# Patient Record
Sex: Female | Born: 1955 | Race: Black or African American | Hispanic: No | Marital: Single | Smoking: Never smoker
Health system: Southern US, Community
[De-identification: ages and names within clinical notes are randomized; demographics above are authoritative.]

## PROBLEM LIST (undated history)

## (undated) ENCOUNTER — Inpatient Hospital Stay (HOSPITAL_COMMUNITY): Payer: 59

## (undated) DIAGNOSIS — R7309 Other abnormal glucose: Secondary | ICD-10-CM

## (undated) DIAGNOSIS — D649 Anemia, unspecified: Secondary | ICD-10-CM

## (undated) DIAGNOSIS — E669 Obesity, unspecified: Secondary | ICD-10-CM

## (undated) DIAGNOSIS — I10 Essential (primary) hypertension: Secondary | ICD-10-CM

## (undated) DIAGNOSIS — IMO0002 Reserved for concepts with insufficient information to code with codable children: Secondary | ICD-10-CM

## (undated) DIAGNOSIS — M1990 Unspecified osteoarthritis, unspecified site: Secondary | ICD-10-CM

## (undated) DIAGNOSIS — K219 Gastro-esophageal reflux disease without esophagitis: Secondary | ICD-10-CM

## (undated) DIAGNOSIS — E785 Hyperlipidemia, unspecified: Secondary | ICD-10-CM

## (undated) DIAGNOSIS — D573 Sickle-cell trait: Secondary | ICD-10-CM

## (undated) HISTORY — DX: Gastro-esophageal reflux disease without esophagitis: K21.9

## (undated) HISTORY — DX: Essential (primary) hypertension: I10

## (undated) HISTORY — DX: Hyperlipidemia, unspecified: E78.5

## (undated) HISTORY — DX: Other abnormal glucose: R73.09

## (undated) HISTORY — DX: Anemia, unspecified: D64.9

## (undated) HISTORY — DX: Unspecified osteoarthritis, unspecified site: M19.90

## (undated) HISTORY — DX: Reserved for concepts with insufficient information to code with codable children: IMO0002

## (undated) HISTORY — DX: Sickle-cell trait: D57.3

## (undated) HISTORY — DX: Obesity, unspecified: E66.9

---

## 1998-02-07 ENCOUNTER — Ambulatory Visit (HOSPITAL_COMMUNITY): Admission: RE | Admit: 1998-02-07 | Discharge: 1998-02-07 | Payer: Self-pay | Admitting: Obstetrics and Gynecology

## 1999-02-17 ENCOUNTER — Ambulatory Visit (HOSPITAL_COMMUNITY): Admission: RE | Admit: 1999-02-17 | Discharge: 1999-02-17 | Payer: Self-pay | Admitting: Obstetrics and Gynecology

## 1999-02-17 ENCOUNTER — Encounter: Payer: Self-pay | Admitting: Obstetrics and Gynecology

## 1999-05-27 ENCOUNTER — Encounter: Admission: RE | Admit: 1999-05-27 | Discharge: 1999-05-27 | Payer: Self-pay | Admitting: Orthopedic Surgery

## 1999-05-27 ENCOUNTER — Encounter: Payer: Self-pay | Admitting: Orthopedic Surgery

## 1999-10-06 ENCOUNTER — Encounter: Payer: Self-pay | Admitting: Neurological Surgery

## 1999-10-06 ENCOUNTER — Ambulatory Visit (HOSPITAL_COMMUNITY): Admission: RE | Admit: 1999-10-06 | Discharge: 1999-10-06 | Payer: Self-pay | Admitting: Neurological Surgery

## 1999-10-08 ENCOUNTER — Encounter: Admission: RE | Admit: 1999-10-08 | Discharge: 1999-10-28 | Payer: Self-pay | Admitting: Neurological Surgery

## 2000-02-26 ENCOUNTER — Ambulatory Visit (HOSPITAL_COMMUNITY): Admission: RE | Admit: 2000-02-26 | Discharge: 2000-02-26 | Payer: Self-pay | Admitting: Internal Medicine

## 2000-02-26 ENCOUNTER — Encounter: Payer: Self-pay | Admitting: Internal Medicine

## 2000-05-03 ENCOUNTER — Other Ambulatory Visit: Admission: RE | Admit: 2000-05-03 | Discharge: 2000-05-03 | Payer: Self-pay | Admitting: Obstetrics and Gynecology

## 2001-03-07 ENCOUNTER — Encounter: Payer: Self-pay | Admitting: Obstetrics and Gynecology

## 2001-03-07 ENCOUNTER — Ambulatory Visit (HOSPITAL_COMMUNITY): Admission: RE | Admit: 2001-03-07 | Discharge: 2001-03-07 | Payer: Self-pay | Admitting: Obstetrics and Gynecology

## 2002-03-19 ENCOUNTER — Ambulatory Visit (HOSPITAL_COMMUNITY): Admission: RE | Admit: 2002-03-19 | Discharge: 2002-03-19 | Payer: Self-pay | Admitting: Obstetrics and Gynecology

## 2002-03-19 ENCOUNTER — Encounter: Payer: Self-pay | Admitting: Obstetrics and Gynecology

## 2003-03-22 ENCOUNTER — Ambulatory Visit (HOSPITAL_COMMUNITY): Admission: RE | Admit: 2003-03-22 | Discharge: 2003-03-22 | Payer: Self-pay | Admitting: Obstetrics and Gynecology

## 2004-03-26 ENCOUNTER — Ambulatory Visit (HOSPITAL_COMMUNITY): Admission: RE | Admit: 2004-03-26 | Discharge: 2004-03-26 | Payer: Self-pay | Admitting: Obstetrics and Gynecology

## 2004-11-18 ENCOUNTER — Encounter: Admission: RE | Admit: 2004-11-18 | Discharge: 2004-11-18 | Payer: Self-pay | Admitting: Internal Medicine

## 2005-04-28 ENCOUNTER — Encounter: Admission: RE | Admit: 2005-04-28 | Discharge: 2005-04-28 | Payer: Self-pay | Admitting: Internal Medicine

## 2005-05-24 HISTORY — PX: SHOULDER SURGERY: SHX246

## 2005-06-23 ENCOUNTER — Ambulatory Visit: Admission: RE | Admit: 2005-06-23 | Discharge: 2005-06-23 | Payer: Self-pay | Admitting: Internal Medicine

## 2005-12-21 ENCOUNTER — Ambulatory Visit (HOSPITAL_BASED_OUTPATIENT_CLINIC_OR_DEPARTMENT_OTHER): Admission: RE | Admit: 2005-12-21 | Discharge: 2005-12-21 | Payer: Self-pay | Admitting: Orthopedic Surgery

## 2005-12-29 ENCOUNTER — Encounter: Admission: RE | Admit: 2005-12-29 | Discharge: 2006-03-02 | Payer: Self-pay | Admitting: Orthopedic Surgery

## 2006-05-02 ENCOUNTER — Ambulatory Visit (HOSPITAL_COMMUNITY): Admission: RE | Admit: 2006-05-02 | Discharge: 2006-05-02 | Payer: Self-pay | Admitting: Internal Medicine

## 2007-05-10 ENCOUNTER — Ambulatory Visit (HOSPITAL_COMMUNITY): Admission: RE | Admit: 2007-05-10 | Discharge: 2007-05-10 | Payer: Self-pay | Admitting: Obstetrics and Gynecology

## 2008-05-13 ENCOUNTER — Ambulatory Visit (HOSPITAL_COMMUNITY): Admission: RE | Admit: 2008-05-13 | Discharge: 2008-05-13 | Payer: Self-pay | Admitting: Internal Medicine

## 2009-05-14 ENCOUNTER — Ambulatory Visit (HOSPITAL_COMMUNITY): Admission: RE | Admit: 2009-05-14 | Discharge: 2009-05-14 | Payer: Self-pay | Admitting: Obstetrics and Gynecology

## 2009-06-24 LAB — HM COLONOSCOPY

## 2010-05-19 ENCOUNTER — Ambulatory Visit (HOSPITAL_COMMUNITY)
Admission: RE | Admit: 2010-05-19 | Discharge: 2010-05-19 | Payer: Self-pay | Source: Home / Self Care | Attending: Internal Medicine | Admitting: Internal Medicine

## 2010-10-09 NOTE — Op Note (Signed)
Alicia, Diaz               ACCOUNT NO.:  [id number]   MEDICAL RECORD NO.:  [id number]          PATIENT TYPE:  AMB   LOCATION:  DSC                          FACILITY:  [facility]   PHYSICIAN:  Deidre Ala, M.D.    DATE OF BIRTH:  1955-10-08   DATE OF PROCEDURE:  12/21/2005  DATE OF DISCHARGE:                                 OPERATIVE REPORT   PREOPERATIVE DIAGNOSES:  1.  Left shoulder impingement syndrome.  2.  Acromioclavicular joint arthritis.  3.  Rotator cuff tear with severe tendinosi's.   POSTOPERATIVE DIAGNOSES:  1.  Impingement syndrome, right shoulder.  2.  Severe acromioclavicular joint arthritis.  3.  Irreparable rotator cuff tear.  4.  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  5.  Glenoid labral tear.   PROCEDURE:  1.  Left shoulder operative arthroscopy with subacromial arch decompression,      acromioplasty.  2.  Extensive debridement of glenohumeral joint degenerative joint disease,      labral tear and rotator cuff tear.  3.  Arthroscopic distal clavicular resection.   SURGEON:  1.  Charlesetta Shanks, M.D.   ASSISTANTJeronimo Norma, PA-C   ANESTHESIA:  General endotracheal.   CULTURES:  None.   DRAINS:  None.   ESTIMATED BLOOD LOSS:  Minimal.   PATHOLOGICAL FINDINGS/HISTORY:  Alicia Diaz presented in June with a history of  left shoulder pain.  She had this 2 years prior.  Dr. Oneta Rack had ordered an  MRI scan and there were some ligament tears, there was a reaggravation of  her injury.  We saw her.  She had a type 3 acromion with AC joint spring  changes.  We injected her subacromial space and sent her for an MRI scan, a  new one.  She had advanced cystic erosive changes in the cartilage of the  glenoid rim with AC joint arthrosis with narrowing of the supraspinatus  outlet.  There was a small full-thickness tear of the anterior supraspinatus  tendon insertion, superimposed on a partial thickness articular surface tear  of the adjacent  tendon fibers.  At surgery she had a significant glenoid  labral tear inferiorly with instability.  She had meaning of the tear.  It  was not exactly a Bankart lesion but it was just a stellate inferior  glenolabral tear, degenerative anterior tearing with an intact biceps tendon  with no slap lesion.  There was some undersurface rotator cuff tearing and  there was glenoid DJD in the anterior inferior 1/2 that debrided to bone and  some cystic changes in the posterior glenoid.  There are grade 1 to 2  changes on the humeral head.  The anterior acromion was sharp, craggy and  obviously causing impingement with a marked AC joint arthritis and  degeneration of the AC meniscus. The rotator cuff tear was pulled up off the  tuberosity but when we tried to pull it back down, there were multiple  striated tears through the cuff.  It was delaminated and poor tissue quality  and was deemed unrepairable.  It was derided with  a tuberosity plasty  carried out.   DESCRIPTION OF PROCEDURE:  With adequate anesthesia obtained using  endotracheal technique, 1 g Ancef given IV prophylaxis  and another during  the procedure.  The patient was placed in the supine beach chair position.  The entire procedure was difficult due to a high body mass index.  After  standard prepping and draping of the left shoulder, we made skin markings  for anatomical landmarks which were somewhat difficult.  I then injected the  subacromial space with 0.5% Marcaine with epinephrine to open it up.  I then  entered the shoulder through a posterior portal; anterior portal was  established just lateral to the coracoid.  I then debrided synovitis,  glenolabral tearing anteriorly and inferior with basket and shaver and  undersurface rotator cuff tearing.  ablator was used to smooth. I then  smoothed the glenoid surface of its anterior inferior and posterior rim DJD  with some cystic changes posteriorly.  Portals reversed and similar  shavings  carried out.  I then entered the subacromial space through the posterior  portal; anterolateral portal was established.  I debrided soft tissue from  the anterior undersurface of the acromion and used the ablator on 1 to  smooth.  I then completed acromioplasty to the roof of the subacromial space  in the manner of Caspari with a 6.0 bur.  I then turned the scope medially  sideways and through the anterior portal debrided the [facility] meniscus and  completed distal clavicular resection 2 shaver breadths in.  I then entered  the shoulder form the lateral portal and debrided the anterior undersurface  of the acromion back to the bicortical mode in the manner of Caspari.  Additional distal clavicular resection was carried out and smoothed.  I then  made an additional posterolateral portal and used a grabber to assess the  viability of the rotator cuff. We deemed it irreparable and used basket and  shaver to debride the rotator cuff back to a stable rim anteriorly and  posteriorly with ablator used to smooth.  I then completed the tuberosity  plasty to smooth the tuberosity and used the ablator to smooth.  When I was  satisfied with the resection, the shoulder was irrigated through the scope,  0.5% Marcaine was injected about the portals. The portals were closed except  the anterior one to drain.  We used a bulky sterile compressive dressing  with a sling.  The patient having tolerated the procedure well was awakened,  taken to the recovery room in satisfactory condition to be discharged per  patient routine, given Percocet for pain and told to call the office for  recheck tomorrow.           ______________________________  V. Charlesetta Shanks, M.D.     VEP/MEDQ  D:  12/21/2005  T:  12/22/2005  Job:  [id number]   cc:   Lucky Cowboy, M.D.

## 2011-04-19 ENCOUNTER — Other Ambulatory Visit (HOSPITAL_COMMUNITY): Payer: Self-pay | Admitting: Obstetrics and Gynecology

## 2011-04-19 DIAGNOSIS — Z1231 Encounter for screening mammogram for malignant neoplasm of breast: Secondary | ICD-10-CM

## 2011-05-21 ENCOUNTER — Ambulatory Visit (HOSPITAL_COMMUNITY)
Admission: RE | Admit: 2011-05-21 | Discharge: 2011-05-21 | Disposition: A | Discharge status: Home / Self Care | Payer: 59 | Source: Ambulatory Visit | Attending: Obstetrics and Gynecology | Admitting: Obstetrics and Gynecology

## 2011-05-21 DIAGNOSIS — Z1231 Encounter for screening mammogram for malignant neoplasm of breast: Secondary | ICD-10-CM

## 2012-04-27 ENCOUNTER — Other Ambulatory Visit (HOSPITAL_COMMUNITY): Payer: Self-pay | Admitting: Obstetrics and Gynecology

## 2012-04-27 DIAGNOSIS — Z1231 Encounter for screening mammogram for malignant neoplasm of breast: Secondary | ICD-10-CM

## 2012-05-22 ENCOUNTER — Ambulatory Visit (HOSPITAL_COMMUNITY)
Admission: RE | Admit: 2012-05-22 | Discharge: 2012-05-22 | Disposition: A | Discharge status: Home / Self Care | Payer: 59 | Source: Ambulatory Visit | Attending: Obstetrics and Gynecology | Admitting: Obstetrics and Gynecology

## 2012-05-22 DIAGNOSIS — Z1231 Encounter for screening mammogram for malignant neoplasm of breast: Secondary | ICD-10-CM | POA: Insufficient documentation

## 2013-04-12 ENCOUNTER — Ambulatory Visit: Payer: 59 | Admitting: Emergency Medicine

## 2013-04-12 ENCOUNTER — Encounter: Payer: Self-pay | Admitting: Emergency Medicine

## 2013-04-12 VITALS — BP 132/84 | HR 86 | Temp 97.8°F | Resp 20 | Ht 67.5 in | Wt 348.0 lb

## 2013-04-12 DIAGNOSIS — R7309 Other abnormal glucose: Secondary | ICD-10-CM | POA: Insufficient documentation

## 2013-04-12 DIAGNOSIS — D573 Sickle-cell trait: Secondary | ICD-10-CM | POA: Insufficient documentation

## 2013-04-12 DIAGNOSIS — M1990 Unspecified osteoarthritis, unspecified site: Secondary | ICD-10-CM | POA: Insufficient documentation

## 2013-04-12 DIAGNOSIS — E782 Mixed hyperlipidemia: Secondary | ICD-10-CM | POA: Insufficient documentation

## 2013-04-12 DIAGNOSIS — I10 Essential (primary) hypertension: Secondary | ICD-10-CM

## 2013-04-12 DIAGNOSIS — M25572 Pain in left ankle and joints of left foot: Secondary | ICD-10-CM

## 2013-04-12 LAB — BASIC METABOLIC PANEL WITH GFR
BUN: 15 mg/dL (ref 6–23)
CO2: 27 mEq/L (ref 19–32)
Calcium: 9.6 mg/dL (ref 8.4–10.5)
Chloride: 99 mEq/L (ref 96–112)
Creat: 0.76 mg/dL (ref 0.50–1.10)
GFR, Est African American: >89 mL/min
GFR, Est Non African American: 87 mL/min
Glucose, Bld: 93 mg/dL (ref 70–99)
Potassium: 4.1 mEq/L (ref 3.5–5.3)
Sodium: 137 mEq/L (ref 135–145)

## 2013-04-12 LAB — CBC WITH DIFFERENTIAL/PLATELET
Eosinophils Relative: 2 % (ref 0–5)
HCT: 37.9 % (ref 36.0–46.0)
Hemoglobin: 12.8 g/dL (ref 12.0–15.0)
Lymphocytes Relative: 35 % (ref 12–46)
Lymphs Abs: 2.7 10*3/uL (ref 0.7–4.0)
MCV: 81.9 fL (ref 78.0–100.0)
Monocytes Absolute: 0.5 10*3/uL (ref 0.1–1.0)
Monocytes Relative: 6 % (ref 3–12)
Neutro Abs: 4.4 10*3/uL (ref 1.7–7.7)
RBC: 4.63 MIL/uL (ref 3.87–5.11)
RDW: 13.9 % (ref 11.5–15.5)
WBC: 7.7 10*3/uL (ref 4.0–10.5)

## 2013-04-12 MED ORDER — ALLOPURINOL 300 MG PO TABS: 300.0000 mg | ORAL_TABLET | Freq: Every day | ORAL | Status: DC

## 2013-04-12 MED ORDER — HYDROCODONE-ACETAMINOPHEN 5-325 MG PO TABS: ORAL_TABLET | ORAL | Status: DC

## 2013-04-12 MED ORDER — METHYLPREDNISOLONE (PAK) 4 MG PO TABS: 4.0000 mg | ORAL_TABLET | Freq: Every day | ORAL | Status: DC

## 2013-04-12 NOTE — Patient Instructions (Signed)
Gout  Gout is when your joints become red, sore, and swell (inflammed). This is caused by the buildup of uric acid crystals in the joints. Uric acid is a chemical that is normally in the blood. If the level of uric acid gets too high in the blood, these crystals form in your joints and tissues. Over time, these crystals can form into masses near the joints and tissues. These masses can destroy bone and cause the bone to look misshapen (deformed).  HOME CARE   · Do not take aspirin for pain.  · Only take medicine as told by your doctor.  · Rest the joint as much as you can. When in bed, keep sheets and blankets off painful areas.  · Keep the sore joints raised (elevated).  · Put warm or cold packs on painful joints. Use of warm or cold packs depends on which works best for you.  · Use crutches if the painful joint is in your leg.  · Drink enough fluids to keep your pee (urine) clear or pale yellow. Limit alcohol, sugary drinks, and drinks with fructose in them.  · Follow your diet instructions. Pay careful attention to how much protein you eat. Include fruits, vegetables, whole grains, and fat-free or low-fat milk products in your daily diet. Talk to your doctor or dietician about the use of coffee, vitamin C, and cherries. These may help lower uric acid levels.  · Keep a healthy body weight.  GET HELP RIGHT AWAY IF:   · You have watery poop (diarrhea), throw up (vomit), or have any side effects from medicines.  · You do not feel better in 24 hours, or you are getting worse.  · Your joint becomes suddenly more tender, and you have chills or a fever.  MAKE SURE YOU:   · Understand these instructions.  · Will watch your condition.  · Will get help right away if you are not doing well or get worse.  Document Released: 02/17/2008 Document Revised: 09/04/2012 Document Reviewed: 08/18/2009  [employer]® Patient Information ©2014 [employer].

## 2013-04-13 ENCOUNTER — Telehealth: Payer: Self-pay | Admitting: *Deleted

## 2013-04-13 NOTE — Telephone Encounter (Signed)
Message copied by Nicholaus Corolla A on Fri Apr 13, 2013  9:47 AM ------      Message from: [place], [place] R      Created: Fri Apr 13, 2013  5:46 AM       Gout positive. Take Rx AD. Will need recheck at 05/02/13 OV ------

## 2013-04-13 NOTE — Progress Notes (Signed)
  Subjective:    Patient ID: Alicia Diaz, female    DOB: 1956/03/20, 57 y.o.   MRN: [id number]  HPI Comments: 57 yo presents for increased foot pain, redness and edema without trauma x 5days. She notes mother has gout. No HX of clots. She has not taken anything except for old vicodin which help temporarily.   Medications/ allergies no change and reviewed from last OV on 03/07/13  Past Medical History  Diagnosis Date  . Obesity   . Anemia   . Hyperlipidemia   . Hypertension   . Sickle cell trait   . Elevated hemoglobin A1c   . DJD (degenerative joint disease)   . DDD (degenerative disc disease)     Review of Systems  Musculoskeletal: Positive for gait problem and joint swelling.  All other systems reviewed and are negative.    BP 132/84  Pulse 86  Temp(Src) 97.8 F (36.6 C) (Temporal)  Resp 20  Ht 5' 7.5" (1.715 m)  Wt 348 lb (157.852 kg)  BMI 53.67 kg/m2     Objective:   Physical Exam  Constitutional: She is oriented to person, place, and time. She appears well-developed and well-nourished.  Cardiovascular: Normal rate, regular rhythm, normal heart sounds and intact distal pulses.   Pulmonary/Chest: Effort normal and breath sounds normal.  Musculoskeletal: Normal range of motion. She exhibits edema and tenderness.  Left medial foot and great toe   Neurological: She is alert and oriented to person, place, and time.  Skin: Skin is warm and dry. There is erythema.  Left medial foot and great toe  Psychiatric: Judgment normal.          Assessment & Plan:  1. Probable gout check labs, rest, elevate, ice. Pred DP 10 mg AD, add Allopurinol 300 mg 03/25/13, Norco 5/325 AD. Hygiene/ education explained

## 2013-04-16 NOTE — Telephone Encounter (Signed)
Spoke with patient about recent lab results.  Pt states positive improvement with symptoms and decreased pain with Rx for gout.

## 2013-04-26 ENCOUNTER — Encounter: Payer: Self-pay | Admitting: General Practice

## 2013-04-26 ENCOUNTER — Encounter: Payer: Self-pay | Admitting: Internal Medicine

## 2013-04-30 ENCOUNTER — Other Ambulatory Visit (HOSPITAL_COMMUNITY): Payer: Self-pay | Admitting: Obstetrics and Gynecology

## 2013-04-30 DIAGNOSIS — Z1231 Encounter for screening mammogram for malignant neoplasm of breast: Secondary | ICD-10-CM

## 2013-05-02 ENCOUNTER — Ambulatory Visit (INDEPENDENT_AMBULATORY_CARE_PROVIDER_SITE_OTHER): Payer: 59 | Admitting: Internal Medicine

## 2013-05-02 ENCOUNTER — Encounter: Payer: Self-pay | Admitting: Internal Medicine

## 2013-05-02 VITALS — BP 156/100 | HR 88 | Temp 97.3°F | Resp 18 | Ht 67.25 in | Wt 354.0 lb

## 2013-05-02 DIAGNOSIS — R7401 Elevation of levels of liver transaminase levels: Secondary | ICD-10-CM

## 2013-05-02 DIAGNOSIS — Z111 Encounter for screening for respiratory tuberculosis: Secondary | ICD-10-CM

## 2013-05-02 DIAGNOSIS — Z113 Encounter for screening for infections with a predominantly sexual mode of transmission: Secondary | ICD-10-CM

## 2013-05-02 DIAGNOSIS — I10 Essential (primary) hypertension: Secondary | ICD-10-CM

## 2013-05-02 DIAGNOSIS — Z0000 Encounter for general adult medical examination without abnormal findings: Secondary | ICD-10-CM

## 2013-05-02 DIAGNOSIS — Z1212 Encounter for screening for malignant neoplasm of rectum: Secondary | ICD-10-CM

## 2013-05-02 DIAGNOSIS — R7309 Other abnormal glucose: Secondary | ICD-10-CM

## 2013-05-02 DIAGNOSIS — E559 Vitamin D deficiency, unspecified: Secondary | ICD-10-CM

## 2013-05-02 LAB — LIPID PANEL
Cholesterol: 201 mg/dL — ABNORMAL HIGH (ref 0–200)
HDL: 52 mg/dL (ref >39)
Total CHOL/HDL Ratio: 3.9 Ratio
Triglycerides: 138 mg/dL (ref <150)

## 2013-05-02 LAB — BASIC METABOLIC PANEL WITH GFR
CO2: 28 mEq/L (ref 19–32)
Chloride: 98 mEq/L (ref 96–112)
Glucose, Bld: 90 mg/dL (ref 70–99)
Sodium: 137 mEq/L (ref 135–145)

## 2013-05-02 LAB — CBC WITH DIFFERENTIAL/PLATELET
Eosinophils Absolute: 0.1 10*3/uL (ref 0.0–0.7)
Eosinophils Relative: 2 % (ref 0–5)
Hemoglobin: 12.6 g/dL (ref 12.0–15.0)
Lymphocytes Relative: 48 % — ABNORMAL HIGH (ref 12–46)
Lymphs Abs: 2.9 10*3/uL (ref 0.7–4.0)
MCH: 27.8 pg (ref 26.0–34.0)
MCV: 83.4 fL (ref 78.0–100.0)
Monocytes Relative: 6 % (ref 3–12)
Neutrophils Relative %: 43 % (ref 43–77)
Platelets: 214 10*3/uL (ref 150–400)
RBC: 4.53 MIL/uL (ref 3.87–5.11)
WBC: 6 10*3/uL (ref 4.0–10.5)

## 2013-05-02 LAB — HEPATIC FUNCTION PANEL
ALT: 25 U/L (ref 0–35)
AST: 20 U/L (ref 0–37)
Alkaline Phosphatase: 117 U/L (ref 39–117)
Bilirubin, Direct: 0.2 mg/dL (ref 0.0–0.3)
Total Bilirubin: 0.9 mg/dL (ref 0.3–1.2)

## 2013-05-02 LAB — TSH: TSH: 2.877 u[IU]/mL (ref 0.350–4.500)

## 2013-05-02 MED ORDER — PHENTERMINE HCL 37.5 MG PO TABS: 37.5000 mg | ORAL_TABLET | Freq: Every day | ORAL | Status: DC

## 2013-05-02 NOTE — Progress Notes (Signed)
Patient ID: Alicia Diaz, female   DOB: 22-May-1956, 57 y.o.   MRN: [id number]  Annual Screening Comprehensive Examination  This very nice 57 yo SBF  presents for complete physical.  Patient has been followed for HTN,  Prediabetes, Gout, Hyperlipidemia, Morbid Obesity and Vitamin D Deficiency.   Patient's BP was elevated today at 156/100 and on recheck at the Rt wrist it was 134/86. Patient denies any cardiac symptoms as chest pain, palpitations, shortness of breath, dizziness or ankle swelling.   Patient's hyperlipidemia is controlled with diet and medications. Patient denies myalgias or other medication SE's. Last cholesterol last visit was 182, triglycerides 123, HDL 53 and LDL 104.     Patient has prediabetes/insulin resistance with last A1c  5.9% / insulin 25 in October. Patient denies reactive hypoglycemic symptoms, visual blurring, diabetic polys, or paresthesias.    Finally, patient has history of Vitamin D Deficiency with last vitamin D of 25 in October with patient persistently declining to take Vitamin D blaming it  for causing muscle cramps which I have advised her I do not feel is a causative agent.   Regarding patient's Morbid Obesity with BMI of 55, her insurance company has repeatedly refused to cover her for gastric bipass surgery despite her going thru an intensive certification process at [employer] a couple of years ago.     Medication Sig Dispense Refill  . allopurinol (ZYLOPRIM) 300 MG tablet Take 1 tablet (300 mg total) by mouth daily.  90 tablet  2  . ALPRAZolam (XANAX) 0.5 MG tablet Take 0.5 mg by mouth 2 (two) times daily as needed for anxiety.      Marland Kitchen aspirin 81 MG tablet Take 81 mg by mouth daily.      . bumetanide (BUMEX) 1 MG tablet Take 1 mg by mouth as needed.      . doxazosin (CARDURA) 4 MG tablet Take 4 mg by mouth daily.      Marland Kitchen HYDROcodone-acetaminophen (NORCO/VICODIN) 5-325 MG per tablet 1- 2 tabs every 4-6 hours prn pain  60 tablet  0  .  losartan (COZAAR) 100 MG tablet Take 100 mg by mouth daily.      . naproxen sodium (ANAPROX) 220 MG tablet Take 220 mg by mouth daily as needed.      . potassium chloride (K-DUR,KLOR-CON) 10 MEQ tablet Take 20 mEq by mouth daily.         Allergies  Allergen Reactions  . Ace Inhibitors     Cough  . Paxil [Paroxetine Hcl]     Mood swings  . Prednisone     Nausea/ irratated  . Vitamin D Analogs     Cramping    Past Medical History  Diagnosis Date  . Obesity   . Anemia   . Hyperlipidemia   . Hypertension   . Sickle cell trait   . Elevated hemoglobin A1c   . DJD (degenerative joint disease)   . DDD (degenerative disc disease)   . GERD (gastroesophageal reflux disease)     No past surgical history on file.  Family History  Problem Relation Age of Onset  . Heart disease Mother   . Cancer Father     prostate  . Multiple myeloma Father   . Diabetes Sister   . Mental illness Sister   . Diabetes Brother   . Obstructive Sleep Apnea Brother     History  Substance Use Topics  . Smoking status: Never Smoker   . Smokeless tobacco: Not on  file  . Alcohol Use: 3.0 oz/week    6 drink(s) per week    ROS Constitutional: Denies fever, chills, weight loss/gain, headaches, insomnia, fatigue, night sweats, and change in appetite. Eyes: Denies redness, blurred vision, diplopia, discharge, itchy, watery eyes.  ENT: Denies discharge, congestion, post nasal drip, epistaxis, sore throat, earache, hearing loss, dental pain, Tinnitus, Vertigo, Sinus pain, snoring.  Cardio: Denies chest pain, palpitations, irregular heartbeat, syncope, dyspnea, diaphoresis, orthopnea, PND, claudication, edema Respiratory: denies cough, dyspnea, DOE, pleurisy, hoarseness, laryngitis, wheezing.  Gastrointestinal: Denies dysphagia, heartburn, reflux, water brash, pain, cramps, nausea, vomiting, bloating, diarrhea, constipation, hematemesis, melena, hematochezia, jaundice, hemorrhoids Genitourinary: Denies  dysuria, frequency, urgency, nocturia, hesitancy, discharge, hematuria, flank pain Breast:Breast lumps, nipple discharge, bleeding.  Musculoskeletal: Denies arthralgia, myalgia, stiffness, Jt. Swelling, pain, limp, and strain/sprain. Skin: Denies puritis, rash, hives, warts, acne, eczema, changing in skin lesion Neuro: No weakness, tremor, incoordination, spasms, paresthesia, pain Psychiatric: Denies confusion, memory loss, sensory loss Endocrine: Denies change in weight, skin, hair change, nocturia, and paresthesia, diabetic polys, visual blurring, hyper / hypo glycemic episodes.  Heme/Lymph: No excessive bleeding, bruising, enlarged lymph nodes.  Filed Vitals:   05/02/13 1415  BP: 156/100  Pulse: 88  Temp: 97.3 F (36.3 C)  Resp: 18    Estimated body mass index is 55.04 kg/(m^2) as calculated from the following:   Height as of this encounter: 5' 7.25" (1.708 m).   Weight as of this encounter: 354 lb (160.573 kg).  Physical Exam General Appearance: Well nourished, in no apparent distress. Eyes: PERRLA, EOMs, conjunctiva no swelling or erythema, normal fundi and vessels. Sinuses: No frontal/maxillary tenderness ENT/Mouth: EACs patent / TMs  nl. Nares clear without erythema, swelling, mucoid exudates. Oral hygiene is good. No erythema, swelling, or exudate. Tongue normal, non-obstructing. Tonsils not swollen or erythematous. Hearing normal.  Neck: Supple, thyroid normal. No bruits, nodes or JVD. Respiratory: Respiratory effort normal.  BS equal and clear bilateral without rales, rhonci, wheezing or stridor. Cardio: Heart sounds are normal with regular rate and rhythm and no murmurs, rubs or gallops. Peripheral pulses are normal and equal bilaterally without edema. No aortic or femoral bruits. Chest: symmetric with normal excursions and percussion. Breasts:Deferred to Dr Ambrose Mantle - seeing him next week. Abdomen: Flat, soft, with bowl sounds. Nontender, no guarding, rebound, hernias,  masses, or organomegaly.  Lymphatics: Non tender without lymphadenopathy.   Musculoskeletal: Full ROM all peripheral extremities, joint stability, 5/5 strength, and normal gait. Skin: Warm and dry without rashes, lesions, cyanosis, clubbing or  ecchymosis.  Neuro: Cranial nerves intact, reflexes equal bilaterally. Normal muscle tone, no cerebellar symptoms. Sensation intact.  Pysch: Awake and oriented X 3, normal affect, Insight and Judgment appropriate.   Assessment and Plan  1. Annual Screening Examination 2. Hypertension  3. Hyperlipidemia 4. Pre Diabetes 5. Vitamin D Deficiency 6. Gout 7. Morbid Obesity  Continue prudent diet as discussed, weight control, BP monitoring, regular exercise, and medications. Discussed med's effects and SE's. Screening labs and tests as requested with regular follow-up as recommended. Patient is amenable to a trial with Phentermine for weight control after discussion of effects and side-effects.

## 2013-05-02 NOTE — Patient Instructions (Signed)

## 2013-05-03 LAB — VITAMIN D 25 HYDROXY (VIT D DEFICIENCY, FRACTURES): Vit D, 25-Hydroxy: 20 ng/mL — ABNORMAL LOW (ref 30–89)

## 2013-05-03 LAB — MICROALBUMIN / CREATININE URINE RATIO
Creatinine, Urine: 93.4 mg/dL
Microalb Creat Ratio: 10.7 mg/g (ref 0.0–30.0)
Microalb, Ur: 1 mg/dL (ref 0.00–1.89)

## 2013-05-03 LAB — HEPATITIS C ANTIBODY: HCV Ab: NEGATIVE

## 2013-05-03 LAB — INSULIN, FASTING: Insulin fasting, serum: 34 u[IU]/mL — ABNORMAL HIGH (ref 3–28)

## 2013-05-03 LAB — HEPATITIS B CORE ANTIBODY, TOTAL: Hep B Core Total Ab: NONREACTIVE

## 2013-05-03 LAB — HEPATITIS A ANTIBODY, TOTAL: Hep A Total Ab: REACTIVE — AB

## 2013-05-08 LAB — TB SKIN TEST
Induration: 0 mm
TB Skin Test: NEGATIVE

## 2013-05-25 ENCOUNTER — Ambulatory Visit (HOSPITAL_COMMUNITY)
Admission: RE | Admit: 2013-05-25 | Discharge: 2013-05-25 | Disposition: A | Discharge status: Home / Self Care | Payer: 59 | Source: Ambulatory Visit | Attending: Obstetrics and Gynecology | Admitting: Obstetrics and Gynecology

## 2013-05-25 DIAGNOSIS — Z1231 Encounter for screening mammogram for malignant neoplasm of breast: Secondary | ICD-10-CM

## 2013-05-29 ENCOUNTER — Other Ambulatory Visit: Payer: Self-pay | Admitting: Internal Medicine

## 2013-05-29 DIAGNOSIS — Z1212 Encounter for screening for malignant neoplasm of rectum: Secondary | ICD-10-CM

## 2013-05-29 LAB — POC HEMOCCULT BLD/STL (HOME/3-CARD/SCREEN)
FECAL OCCULT BLD: NEGATIVE
FECAL OCCULT BLD: NEGATIVE
NEG CONTROL: NEGATIVE

## 2013-05-30 ENCOUNTER — Ambulatory Visit (INDEPENDENT_AMBULATORY_CARE_PROVIDER_SITE_OTHER): Payer: 59 | Admitting: General Surgery

## 2013-05-30 ENCOUNTER — Encounter (INDEPENDENT_AMBULATORY_CARE_PROVIDER_SITE_OTHER): Payer: Self-pay | Admitting: General Surgery

## 2013-05-30 ENCOUNTER — Encounter (INDEPENDENT_AMBULATORY_CARE_PROVIDER_SITE_OTHER): Payer: Self-pay

## 2013-05-30 VITALS — BP 126/72 | HR 80 | Temp 97.2°F | Resp 18 | Ht 68.0 in | Wt 345.6 lb

## 2013-05-30 DIAGNOSIS — R159 Full incontinence of feces: Secondary | ICD-10-CM

## 2013-05-30 DIAGNOSIS — N816 Rectocele: Secondary | ICD-10-CM

## 2013-05-30 NOTE — Patient Instructions (Signed)
GETTING TO GOOD BOWEL HEALTH. Irregular bowel habits such as diarrhea can lead to many problems over time.  Having one soft, formed bowel movement a day is the most important way to prevent further problems.  The anorectal canal is designed to handle stretching and feces to safely manage our ability to get rid of solid waste (feces, poop, stool) out of our body.  BUT, diarrhea can be a burning fire to this very sensitive area of our body, causing inflamed hemorrhoids, anal fissures, increasing risk is perirectal abscesses, abdominal pain and bloating.     The goal: ONE SOFT BOWEL MOVEMENT A DAY!  To have soft, regular bowel movements:    Drink at least 8 tall glasses of water a day.     Take plenty of fiber.  Fiber is the undigested part of plant food that passes into the colon, acting s "natures broom" to encourage bowel motility and movement.  Fiber can absorb and hold large amounts of water. This results in a larger, bulkier stool, which is soft and easier to pass. Work gradually over several weeks up to 6 servings a day of fiber (25g a day even more if needed) in the form of: o Vegetables -- Root (potatoes, carrots, turnips), leafy green (lettuce, salad greens, celery, spinach), or cooked high residue (cabbage, broccoli, etc) o Fruit -- Fresh (unpeeled skin & pulp), Dried (prunes, apricots, cherries, etc ),  or stewed ( applesauce)  o Whole grain breads, pasta, etc (whole wheat)  o Bran cereals    Bulking Agents -- This type of water-retaining fiber generally is easily obtained each day by one of the following:  o Methylcellulose -- This is another fiber derived from wood which also retains water. It is available as Citrucel or Benefiber.   Controlling diarrhea o Switch to liquids and simpler foods for a few days to avoid stressing your intestines further. o Avoid dairy products (especially milk & ice cream) for a short time.  The intestines often can lose the ability to digest lactose when  stressed. o Avoid foods that cause gassiness or bloating.  Typical foods include beans and other legumes, cabbage, broccoli, and dairy foods.  Every person has some sensitivity to other foods, so listen to our body and avoid those foods that trigger problems for you. o Adding fiber (Citrucel, Metamucil, psyllium, Miralax) gradually can help thicken stools by absorbing excess fluid and retrain the intestines to act more normally.  Slowly increase the dose over a few weeks.  Too much fiber too soon can backfire and cause cramping & bloating. o Probiotics (such as active yogurt, Align, etc) may help repopulate the intestines and colon with normal bacteria and calm down a sensitive digestive tract.  Most studies show it to be of mild help, though, and such products can be costly. o Medicines:   Bismuth subsalicylate (ex. Kayopectate, Pepto Bismol) every 30 minutes for up to 6 doses can help control diarrhea.  Avoid if pregnant.   Loperamide (Immodium) can slow down diarrhea.  Start with two tablets (4mg  total) first and then try one tablet every 6 hours.  Avoid if you are having fevers or severe pain.  If you are not better or start feeling worse, stop all medicines and call your doctor for advice o Call your doctor if you are getting worse or not better.  Sometimes further testing (cultures, endoscopy, X-ray studies, bloodwork, etc) may be needed to help diagnose and treat the cause of the diarrhea. o

## 2013-05-30 NOTE — Progress Notes (Signed)
Chief Complaint  Patient presents with  . Rectal Problems    Evaluate fecal incontinence    HISTORY: Alicia Diaz is a 58 y.o. female who presents to the office with fecal incontinence.  This had been occurring for many years.  She saw [facility] in 2001 who diagnosed her with a rectocele and advised her to manage medically.   Loose stools makes the symptoms worse.   It is ntermittent in nature (twice a week).  Her bowel habits are regular and daily and her bowel movements are usually soft to loose.  Her fiber intake is dietary.  Her last colonoscopy was about 3 years ago and she thinks this was negative.  She is due in 10 yrs.  She has not had any vaginal deliveries.  She denies any pelvic surgery.  She is incontinent to gas and liquid but not solid stool.  Her gynecologist (Dr Velva Harman) has seen her as well for the rectocele.  Past Medical History  Diagnosis Date  . Obesity   . Anemia   . Hyperlipidemia   . Hypertension   . Sickle cell trait   . Elevated hemoglobin A1c   . DJD (degenerative joint disease)   . DDD (degenerative disc disease)   . GERD (gastroesophageal reflux disease)       Past Surgical History  Procedure Laterality Date  . Shoulder surgery Left 2007        Current Outpatient Prescriptions  Medication Sig Dispense Refill  . allopurinol (ZYLOPRIM) 300 MG tablet Take 1 tablet (300 mg total) by mouth daily.  90 tablet  2  . aspirin 81 MG tablet Take 81 mg by mouth daily.      . bumetanide (BUMEX) 1 MG tablet Take 1 mg by mouth as needed.      . doxazosin (CARDURA) 4 MG tablet Take 4 mg by mouth daily.      Marland Kitchen HYDROcodone-acetaminophen (NORCO/VICODIN) 5-325 MG per tablet 1- 2 tabs every 4-6 hours prn pain  60 tablet  0  . losartan (COZAAR) 100 MG tablet Take 100 mg by mouth daily.      . naproxen sodium (ANAPROX) 220 MG tablet Take 220 mg by mouth daily as needed.      . phentermine (ADIPEX-P) 37.5 MG tablet Take 1 tablet (37.5 mg total) by mouth daily  before breakfast. For weight loss  30 tablet  3  . potassium chloride (K-DUR,KLOR-CON) 10 MEQ tablet Take 20 mEq by mouth daily.      Marland Kitchen ALPRAZolam (XANAX) 0.5 MG tablet Take 0.5 mg by mouth 2 (two) times daily as needed for anxiety.       No current facility-administered medications for this visit.      Allergies  Allergen Reactions  . Ace Inhibitors     Cough  . Paxil [Paroxetine Hcl]     Mood swings  . Prednisone     Nausea/ irratated  . Vitamin D Analogs     Cramping      Family History  Problem Relation Age of Onset  . Heart disease Mother   . Cancer Father     prostate  . Multiple myeloma Father   . Diabetes Sister   . Mental illness Sister   . Diabetes Brother   . Obstructive Sleep Apnea Brother     History   Social History  . Marital Status: Single    Spouse Name: N/A    Number of Children: N/A  . Years of Education: N/A  Social History Main Topics  . Smoking status: Never Smoker   . Smokeless tobacco: None  . Alcohol Use: 3.0 oz/week    6 drink(s) per week  . Drug Use: None  . Sexual Activity: None   Other Topics Concern  . None   Social History Narrative  . None      REVIEW OF SYSTEMS - PERTINENT POSITIVES ONLY: Review of Systems - General ROS: negative for - chills, fever or weight loss Hematological and Lymphatic ROS: negative for - bleeding problems, blood clots or bruising Respiratory ROS: no cough, shortness of breath, or wheezing Cardiovascular ROS: no chest pain or dyspnea on exertion Gastrointestinal ROS: no abdominal pain, change in bowel habits, or black or bloody stools Genito-Urinary ROS: no dysuria, trouble voiding, or hematuria  EXAM: Filed Vitals:   05/30/13 0928  BP: 126/72  Pulse: 80  Temp: 97.2 F (36.2 C)  Resp: 18    General appearance: alert and cooperative Resp: clear to auscultation bilaterally Cardio: regular rate and rhythm GI: soft, non-tender; bowel sounds normal; no masses,  no  organomegaly   Procedure: Anoscopy Surgeon: Marcello Moores Diagnosis: fecal incontinence  Assistant: Erline Levine After the risks and benefits were explained, verbal consent was obtained for above procedure  Anesthesia: none Findings: Moderate sized rectocele, normal internal anatomy. No masses noted. Moderate sphincter tone    ASSESSMENT AND PLAN: Alicia Diaz is a 58 y.o. F with fecal incontinence.  She appears to have some minor sphincter disruption anteriorly with a moderate to large rectocele.  I think the rectocele is most likely the cause of her incontinence. This may need surgery in the future. I have asked her to start a fiber supplement as a bulking agent. I will see her back in about 8 weeks and see how she is doing with this.      Rosario Adie, MD Colon and Rectal Surgery / [facility]      Visit Diagnoses: 1. Fecal incontinence   2. Rectocele     Primary Care Physician: Alesia Richards, MD

## 2013-07-16 ENCOUNTER — Encounter (INDEPENDENT_AMBULATORY_CARE_PROVIDER_SITE_OTHER): Payer: Self-pay | Admitting: General Surgery

## 2013-08-07 ENCOUNTER — Ambulatory Visit: Payer: Self-pay | Admitting: Physician Assistant

## 2013-08-21 ENCOUNTER — Encounter: Payer: Self-pay | Admitting: Physician Assistant

## 2013-08-21 ENCOUNTER — Ambulatory Visit (INDEPENDENT_AMBULATORY_CARE_PROVIDER_SITE_OTHER): Payer: 59 | Admitting: Physician Assistant

## 2013-08-21 VITALS — BP 128/72 | HR 80 | Temp 97.9°F | Resp 16 | Wt 349.0 lb

## 2013-08-21 DIAGNOSIS — Z79899 Other long term (current) drug therapy: Secondary | ICD-10-CM

## 2013-08-21 DIAGNOSIS — D649 Anemia, unspecified: Secondary | ICD-10-CM

## 2013-08-21 DIAGNOSIS — E785 Hyperlipidemia, unspecified: Secondary | ICD-10-CM

## 2013-08-21 DIAGNOSIS — I10 Essential (primary) hypertension: Secondary | ICD-10-CM

## 2013-08-21 DIAGNOSIS — R7309 Other abnormal glucose: Secondary | ICD-10-CM

## 2013-08-21 DIAGNOSIS — E559 Vitamin D deficiency, unspecified: Secondary | ICD-10-CM

## 2013-08-21 LAB — CBC WITH DIFFERENTIAL/PLATELET
BASOS PCT: 1 % (ref 0–1)
Basophils Absolute: 0.1 10*3/uL (ref 0.0–0.1)
EOS PCT: 2 % (ref 0–5)
Eosinophils Absolute: 0.1 10*3/uL (ref 0.0–0.7)
HEMATOCRIT: 36.8 % (ref 36.0–46.0)
Hemoglobin: 12.3 g/dL (ref 12.0–15.0)
Lymphocytes Relative: 36 % (ref 12–46)
Lymphs Abs: 2.3 10*3/uL (ref 0.7–4.0)
MCH: 27.8 pg (ref 26.0–34.0)
MCHC: 33.4 g/dL (ref 30.0–36.0)
MCV: 83.3 fL (ref 78.0–100.0)
MONO ABS: 0.4 10*3/uL (ref 0.1–1.0)
Monocytes Relative: 7 % (ref 3–12)
NEUTROS ABS: 3.5 10*3/uL (ref 1.7–7.7)
Neutrophils Relative %: 54 % (ref 43–77)
Platelets: 233 10*3/uL (ref 150–400)
RBC: 4.42 MIL/uL (ref 3.87–5.11)
RDW: 14.1 % (ref 11.5–15.5)
WBC: 6.4 10*3/uL (ref 4.0–10.5)

## 2013-08-21 LAB — LIPID PANEL
CHOLESTEROL: 168 mg/dL (ref 0–200)
HDL: 50 mg/dL (ref >39)
LDL Cholesterol: 94 mg/dL (ref 0–99)
Total CHOL/HDL Ratio: 3.4 Ratio
Triglycerides: 119 mg/dL (ref <150)
VLDL: 24 mg/dL (ref 0–40)

## 2013-08-21 LAB — HEPATIC FUNCTION PANEL
ALT: 19 U/L (ref 0–35)
AST: 19 U/L (ref 0–37)
Albumin: 4 g/dL (ref 3.5–5.2)
Alkaline Phosphatase: 117 U/L (ref 39–117)
Bilirubin, Direct: 0.2 mg/dL (ref 0.0–0.3)
Indirect Bilirubin: 0.5 mg/dL (ref 0.2–1.2)
Total Bilirubin: 0.7 mg/dL (ref 0.2–1.2)
Total Protein: 7.3 g/dL (ref 6.0–8.3)

## 2013-08-21 LAB — BASIC METABOLIC PANEL WITHOUT GFR
BUN: 13 mg/dL (ref 6–23)
CO2: 27 meq/L (ref 19–32)
Calcium: 9 mg/dL (ref 8.4–10.5)
Chloride: 101 meq/L (ref 96–112)
Creat: 0.67 mg/dL (ref 0.50–1.10)
GFR, Est African American: >89 mL/min
GFR, Est Non African American: >89 mL/min
Glucose, Bld: 120 mg/dL — ABNORMAL HIGH (ref 70–99)
Potassium: 4.3 meq/L (ref 3.5–5.3)
Sodium: 136 meq/L (ref 135–145)

## 2013-08-21 LAB — HEMOGLOBIN A1C
Hgb A1c MFr Bld: 5.8 % — ABNORMAL HIGH
Mean Plasma Glucose: 120 mg/dL — ABNORMAL HIGH

## 2013-08-21 LAB — MAGNESIUM: MAGNESIUM: 2 mg/dL (ref 1.5–2.5)

## 2013-08-21 LAB — TSH: TSH: 2.773 u[IU]/mL (ref 0.350–4.500)

## 2013-08-21 LAB — VITAMIN D 25 HYDROXY (VIT D DEFICIENCY, FRACTURES): Vit D, 25-Hydroxy: 25 ng/mL — ABNORMAL LOW (ref 30–89)

## 2013-08-21 NOTE — Patient Instructions (Signed)
Bad carbs also include fruit juice, alcohol, and sweet tea. These are empty calories that do not signal to your brain that you are full.   Please remember the good carbs are still carbs which convert into sugar. So please measure them out no more than 1/2-1 cup of rice, oatmeal, pasta, and beans.  Veggies are however free foods! Pile them on.   I like lean protein at every meal such as chicken, [place], pork chops, cottage cheese, etc. Just do not fry these meats and please center your meal around vegetable, the meats should be a side dish.   No all fruit is created equal. Please see the list below, the fruit at the bottom is higher in sugars than the fruit at the top   We want weight loss that will last so you should lose 1-2 pounds a week.  THAT IS IT! Please pick THREE things a month to change. Once it is a habit check off the item. Then pick another three items off the list to become habits.  If you are already doing a habit on the list GREAT!  Cross that item off! o Don't drink your calories. Ie, alcohol, soda, fruit juice, and sweet tea.  o Drink more water. Drink a glass when you feel hungry or before each meal.  o Eat breakfast - Complex carb and protein (likeDannon light and fit yogurt, oatmeal, fruit, eggs, [place] bacon). o Measure your cereal.  Eat no more than one cup a day. (ie [place]) o Eat an apple a day. o Add a vegetable a day. o Try a new vegetable a month. o Use Pam! Stop using oil or butter to cook. o Don't finish your plate or use smaller plates. o Share your dessert. o Eat sugar free Jello for dessert or frozen grapes. o Don't eat 2-3 hours before bed. o Switch to whole wheat bread, pasta, and brown rice. o Make healthier choices when you eat out. No fries! o Pick baked chicken, NOT fried. o Don't forget to SLOW DOWN when you eat. It is not going anywhere.  o Take the stairs. o Park far away in the parking lot o [employer] (or weights) for 10 minutes while  watching TV. o Walk at work for 10 minutes during break. o Walk outside 1 time a week with your friend, kids, dog, or significant other. o Start a walking group at [place] the mall as much as you can tolerate.  o Keep a food diary. o Weigh yourself daily. o Walk for 15 minutes 3 days per week. o Cook at home more often and eat out less.  If life happens and you go back to old habits, it is okay.  Just start over. You can do it!   If you experience chest pain, get short of breath, or tired during the exercise, please stop immediately and inform your doctor.  Obesity Obesity is defined as having too much total body fat and a body mass index (BMI) of 30 or more. BMI is an estimate of body fat and is calculated from your height and weight. Obesity happens when you consume more calories than you can burn by exercising or performing daily physical tasks. Prolonged obesity can cause major illnesses or emergencies, such as:   A stroke.  Heart disease.  Diabetes.  Cancer.  Arthritis.  High blood pressure (hypertension).  High cholesterol.  Sleep apnea.  Erectile dysfunction.  Infertility problems. CAUSES   Regularly eating  unhealthy foods.  Physical inactivity.  Certain disorders, such as an underactive thyroid (hypothyroidism), Cushing's syndrome, and polycystic ovarian syndrome.  Certain medicines, such as steroids, some depression medicines, and antipsychotics.  Genetics.  Lack of sleep. DIAGNOSIS  A caregiver can diagnose obesity after calculating your BMI. Obesity will be diagnosed if your BMI is 30 or higher.  There are other methods of measuring obesity levels. Some other methods include measuring your skin fold thickness, your waist circumference, and comparing your hip circumference to your waist circumference. TREATMENT  A healthy treatment program includes some or all of the following:  Long-term dietary changes.  Exercise and physical  activity.  Behavioral and lifestyle changes.  Medicine only under the supervision of your caregiver. Medicines may help, but only if they are used with diet and exercise programs. An unhealthy treatment program includes:  Fasting.  Fad diets.  Supplements and drugs. These choices do not succeed in long-term weight control.  HOME CARE INSTRUCTIONS   Exercise and perform physical activity as directed by your caregiver. To increase physical activity, try the following:  Use stairs instead of elevators.  Park farther away from store entrances.  Garden, bike, or walk instead of watching television or using the computer.  Eat healthy, low-calorie foods and drinks on a regular basis. Eat more fruits and vegetables. Use low-calorie cookbooks or take healthy cooking classes.  Limit fast food, sweets, and processed snack foods.  Eat smaller portions.  Keep a daily journal of everything you eat. There are many free websites to help you with this. It may be helpful to measure your foods so you can determine if you are eating the correct portion sizes.  Avoid drinking alcohol. Drink more water and drinks without calories.  Take vitamins and supplements only as recommended by your caregiver.  Weight-loss support groups, Nurse, mental health, counselors, and stress reduction education can also be very helpful. SEEK IMMEDIATE MEDICAL CARE IF:  You have chest pain or tightness.  You have trouble breathing or feel short of breath.  You have weakness or leg numbness.  You feel confused or have trouble talking.  You have sudden changes in your vision. MAKE SURE YOU:  Understand these instructions.  Will watch your condition.  Will get help right away if you are not doing well or get worse. Document Released: 06/17/2004 Document Revised: 11/09/2011 Document Reviewed: 06/16/2011 [facility] Patient Information 2014 [place].

## 2013-08-21 NOTE — Progress Notes (Signed)
HPI 58 y.o. female  presents for 3 month follow up with hypertension, hyperlipidemia, prediabetes and vitamin D. Her blood pressure has been controlled at home, today their BP is BP: 128/72 mmHg She does not work out but she does try to park further away in the parking lot and walk a little more. She denies chest pain, shortness of breath, dizziness.  She is not on cholesterol medication and denies myalgias. Her cholesterol is not at goal. The cholesterol last visit was:   Lab Results  Component Value Date   CHOL 201* 05/02/2013   HDL 52 05/02/2013   LDLCALC 121* 05/02/2013   TRIG 138 05/02/2013   CHOLHDL 3.9 05/02/2013   Last A1C in the office was:  Lab Results  Component Value Date   HGBA1C 5.3 05/02/2013   Patient is on Vitamin D supplement.   She is seeing Dr. Marcello Moores for her rectocele and going to try fiber tablets rather than surgery.  She has been on phentermine but states it is no longer working for weight loss, no AE's other than dry mouth which increases water.   Wt Readings from Last 3 Encounters:  08/21/13 349 lb (158.305 kg)  05/30/13 345 lb 9.6 oz (156.763 kg)  05/02/13 354 lb (160.573 kg)   Current Medications:  Current Outpatient Prescriptions on File Prior to Visit  Medication Sig Dispense Refill  . allopurinol (ZYLOPRIM) 300 MG tablet Take 1 tablet (300 mg total) by mouth daily.  90 tablet  2  . ALPRAZolam (XANAX) 0.5 MG tablet Take 0.5 mg by mouth 2 (two) times daily as needed for anxiety.      Marland Kitchen aspirin 81 MG tablet Take 81 mg by mouth daily.      . bumetanide (BUMEX) 1 MG tablet Take 1 mg by mouth as needed.      . doxazosin (CARDURA) 4 MG tablet Take 4 mg by mouth daily.      Marland Kitchen HYDROcodone-acetaminophen (NORCO/VICODIN) 5-325 MG per tablet 1- 2 tabs every 4-6 hours prn pain  60 tablet  0  . losartan (COZAAR) 100 MG tablet Take 100 mg by mouth daily.      . naproxen sodium (ANAPROX) 220 MG tablet Take 220 mg by mouth daily as needed.      . phentermine  (ADIPEX-P) 37.5 MG tablet Take 1 tablet (37.5 mg total) by mouth daily before breakfast. For weight loss  30 tablet  3  . potassium chloride (K-DUR,KLOR-CON) 10 MEQ tablet Take 20 mEq by mouth daily.       No current facility-administered medications on file prior to visit.   Medical History:  Past Medical History  Diagnosis Date  . Obesity   . Anemia   . Hyperlipidemia   . Hypertension   . Sickle cell trait   . Elevated hemoglobin A1c   . DJD (degenerative joint disease)   . DDD (degenerative disc disease)   . GERD (gastroesophageal reflux disease)    Allergies:  Allergies  Allergen Reactions  . Ace Inhibitors     Cough  . Paxil [Paroxetine Hcl]     Mood swings  . Prednisone     Nausea/ irratated  . Vitamin D Analogs     Cramping     Review of Systems: [X]  = complains of  [ ]  = denies  General: Fatigue [ ]  Fever [ ]  Chills [ ]  Weakness [ ]   Insomnia [ ]  Eyes: Redness [ ]  Blurred vision [ ]  Diplopia [ ]   ENT: Congestion [ ]   Sinus Pain [ ]  Post Nasal Drip [ ]  Sore Throat [ ]  Earache [ ]   Cardiac: Chest pain/pressure [ ]  SOB [ ]  Orthopnea [ ]   Palpitations [ ]   Paroxysmal nocturnal dyspnea[ ]  Claudication [ ]  Edema [ ]   Pulmonary: Cough [ ]  Wheezing[ ]   SOB [ ]   Snoring [ ]   GI: Nausea [ ]  Vomiting[ ]  Dysphagia[ ]  Heartburn[ ]  Abdominal pain [ ]  Constipation [ ] ; Diarrhea [ ] ; BRBPR [ ]  Melena[ ]  GU: Hematuria[ ]  Dysuria [ ]  Nocturia[ ]  Urgency [ ]   Hesitancy [ ]  Discharge [ ]  Neuro: Headaches[ ]  Vertigo[ ]  Paresthesias[ ]  Spasm [ ]  Speech changes [ ]  Incoordination [ ]   Ortho: Arthritis [ ]  Joint pain [ ]  Muscle pain [ ]  Joint swelling [ ]  Back Pain [ ]  Skin:  Rash [ ]   Pruritis [ ]  Change in skin lesion [ ]   Psych: Depression[ ]  Anxiety[ ]  Confusion [ ]  Memory loss [ ]   Heme/Lypmh: Bleeding [ ]  Bruising [ ]  Enlarged lymph nodes [ ]   Endocrine: Visual blurring [ ]  Paresthesia [ ]  Polyuria [ ]  Polydypsea [ ]    Heat/cold intolerance [ ]  Hypoglycemia [ ]   Family history-  Review and unchanged Social history- Review and unchanged Physical Exam: BP 128/72  Pulse 80  Temp(Src) 97.9 F (36.6 C)  Resp 16  Wt 349 lb (158.305 kg) Wt Readings from Last 3 Encounters:  08/21/13 349 lb (158.305 kg)  05/30/13 345 lb 9.6 oz (156.763 kg)  05/02/13 354 lb (160.573 kg)   General Appearance: Well nourished, in no apparent distress. Eyes: PERRLA, EOMs, conjunctiva no swelling or erythema Sinuses: No Frontal/maxillary tenderness ENT/Mouth: Ext aud canals clear, TMs without erythema, bulging. No erythema, swelling, or exudate on post pharynx.  Tonsils not swollen or erythematous. Hearing normal.  Neck: Supple, thyroid normal.  Respiratory: Respiratory effort normal, BS equal bilaterally without rales, rhonchi, wheezing or stridor.  Cardio: RRR with no MRGs. Brisk peripheral pulses with 1-2 + edema Abdomen: Soft, + BS, morbidly obese  Non tender, no guarding, rebound, hernias, masses. Lymphatics: Non tender without lymphadenopathy.  Musculoskeletal: Full ROM, 5/5 strength, normal gait.  Skin: Warm, dry without rashes, lesions, ecchymosis.  Neuro: Cranial nerves intact. Normal muscle tone, no cerebellar symptoms. Sensation intact.  Psych: Awake and oriented X 3, normal affect, Insight and Judgment appropriate.   Assessment and Plan:  Hypertension: Continue medication, monitor blood pressure at home. Continue DASH diet. Cholesterol: Continue diet and exercise. Check cholesterol.  Pre-diabetes-Continue diet and exercise. Check A1C Vitamin D Def- check level and continue medications.  Rectocele- continue to follow up with Dr. Marcello Moores.  Obesity- stay off phentermine for now, she may benefit from twice a day- something needs to be done about her obesity due to comorbidities, she would like a gastric bypass but she states that her insurance will not cover.  Likely sleep apnea- declines sleep study despite knowing the increasing risk of MI, stroke, CHF, and respiratory failure.    Continue diet and meds as discussed. Further disposition pending results of labs.  Vicie Mutters 11:20 AM

## 2013-08-22 LAB — INSULIN, FASTING: INSULIN FASTING, SERUM: 32 u[IU]/mL — AB (ref 3–28)

## 2013-11-13 ENCOUNTER — Ambulatory Visit: Payer: Self-pay | Admitting: Internal Medicine

## 2013-11-21 ENCOUNTER — Encounter: Payer: Self-pay | Admitting: Internal Medicine

## 2013-11-21 ENCOUNTER — Ambulatory Visit (INDEPENDENT_AMBULATORY_CARE_PROVIDER_SITE_OTHER): Payer: 59 | Admitting: Internal Medicine

## 2013-11-21 VITALS — BP 142/84 | HR 84 | Temp 97.9°F | Resp 18 | Ht 67.5 in | Wt 362.8 lb

## 2013-11-21 DIAGNOSIS — M546 Pain in thoracic spine: Secondary | ICD-10-CM

## 2013-11-21 DIAGNOSIS — I10 Essential (primary) hypertension: Secondary | ICD-10-CM

## 2013-11-21 DIAGNOSIS — E785 Hyperlipidemia, unspecified: Secondary | ICD-10-CM

## 2013-11-21 DIAGNOSIS — E559 Vitamin D deficiency, unspecified: Secondary | ICD-10-CM

## 2013-11-21 DIAGNOSIS — Z79899 Other long term (current) drug therapy: Secondary | ICD-10-CM

## 2013-11-21 DIAGNOSIS — R7309 Other abnormal glucose: Secondary | ICD-10-CM

## 2013-11-21 LAB — CBC WITH DIFFERENTIAL/PLATELET
BASOS PCT: 0 % (ref 0–1)
Basophils Absolute: 0 10*3/uL (ref 0.0–0.1)
EOS ABS: 0.1 10*3/uL (ref 0.0–0.7)
Eosinophils Relative: 2 % (ref 0–5)
HCT: 35.9 % — ABNORMAL LOW (ref 36.0–46.0)
Hemoglobin: 12 g/dL (ref 12.0–15.0)
Lymphocytes Relative: 32 % (ref 12–46)
Lymphs Abs: 2 10*3/uL (ref 0.7–4.0)
MCH: 27.7 pg (ref 26.0–34.0)
MCHC: 33.4 g/dL (ref 30.0–36.0)
MCV: 82.9 fL (ref 78.0–100.0)
MONOS PCT: 7 % (ref 3–12)
Monocytes Absolute: 0.4 10*3/uL (ref 0.1–1.0)
NEUTROS PCT: 59 % (ref 43–77)
Neutro Abs: 3.7 10*3/uL (ref 1.7–7.7)
PLATELETS: 213 10*3/uL (ref 150–400)
RBC: 4.33 MIL/uL (ref 3.87–5.11)
RDW: 14.1 % (ref 11.5–15.5)
WBC: 6.2 10*3/uL (ref 4.0–10.5)

## 2013-11-21 LAB — HEMOGLOBIN A1C
Hgb A1c MFr Bld: 5.9 % — ABNORMAL HIGH (ref <5.7)
MEAN PLASMA GLUCOSE: 123 mg/dL — AB (ref <117)

## 2013-11-21 NOTE — Patient Instructions (Signed)

## 2013-11-21 NOTE — Progress Notes (Signed)
Patient ID: Alicia Diaz, female   DOB: [birth year], 58 y.o.   MRN: [id number]   This very nice 58 y.o.DBF presents for 3 month follow up with Hypertension, Hyperlipidemia, Pre-Diabetes and Vitamin D Deficiency.     BP has been controlled at home. Today's BP: 142/84 mmHg. Patient denies any cardiac type chest pain, palpitations, dyspnea/orthopnea/PND, dizziness, claudication, or dependent edema.   Hyperlipidemia is controlled with diet & meds. Patient denies myalgias or other med SE's. Last Lipids were  At goal as below in Mar 2015 Lab Results  Component Value Date   CHOL 168 08/21/2013   HDL 50 08/21/2013   LDLCALC 94 08/21/2013   TRIG 119 08/21/2013   CHOLHDL 3.4 08/21/2013    Also, the patient has history of PreDiabetes  and last A1c is 5.9% today. Patient denies any symptoms of reactive hypoglycemia, diabetic polys, paresthesias or visual blurring.   Further, Patient has history of Vitamin D Deficiency  and last vitamin D was low at 25 in Mar 2015. Patient supplements vitamin D without any suspected side-effects.   Medication List   allopurinol 300 MG tablet  Commonly known as:  ZYLOPRIM  Take 1 tablet (300 mg total) by mouth daily.     ALPRAZolam 0.5 MG tablet  Commonly known as:  XANAX  Take 0.5 mg by mouth 2 (two) times daily as needed for anxiety.     aspirin 81 MG tablet  Take 81 mg by mouth daily.     bumetanide 1 MG tablet  Commonly known as:  BUMEX  Take 1 mg by mouth as needed.     doxazosin 4 MG tablet  Commonly known as:  CARDURA  Take 4 mg by mouth daily.     HYDROcodone-acetaminophen 5-325 MG per tablet  Commonly known as:  NORCO/VICODIN  1- 2 tabs every 4-6 hours prn pain     losartan 100 MG tablet  Commonly known as:  COZAAR  Take 100 mg by mouth daily.     naproxen sodium 220 MG tablet  Commonly known as:  ANAPROX  Take 220 mg by mouth daily as needed.     potassium chloride 10 MEQ tablet  Commonly known as:  K-DUR,KLOR-CON  Take 20 mEq by mouth  daily.       Allergies  Allergen Reactions  . Ace Inhibitors     Cough  . Paxil [Paroxetine Hcl]     Mood swings  . Prednisone     Nausea/ irratated  . Vitamin D Analogs     Cramping   PMHx:   Past Medical History  Diagnosis Date  . Obesity   . Anemia   . Hyperlipidemia   . Hypertension   . Sickle cell trait   . Elevated hemoglobin A1c   . DJD (degenerative joint disease)   . DDD (degenerative disc disease)   . GERD (gastroesophageal reflux disease)    FHx:    Reviewed / unchanged  SHx:    Reviewed / unchanged  Systems Review:  Constitutional: Denies fever, chills, wt changes, headaches, insomnia, fatigue, night sweats, change in appetite. Eyes: Denies redness, blurred vision, diplopia, discharge, itchy, watery eyes.  ENT: Denies discharge, congestion, post nasal drip, epistaxis, sore throat, earache, hearing loss, dental pain, tinnitus, vertigo, sinus pain, snoring.  CV: Denies chest pain, palpitations, irregular heartbeat, syncope, dyspnea, diaphoresis, orthopnea, PND, claudication or edema. Respiratory: denies cough, dyspnea, DOE, pleurisy, hoarseness, laryngitis, wheezing.  Gastrointestinal: Denies dysphagia, odynophagia, heartburn, reflux, water brash, abdominal pain or cramps, nausea,  vomiting, bloating, diarrhea, constipation, hematemesis, melena, hematochezia  or hemorrhoids. Genitourinary: Denies dysuria, frequency, urgency, nocturia, hesitancy, discharge, hematuria or flank pain. Musculoskeletal: Denies arthralgias, myalgias, stiffness, jt. swelling, pain, limping or strain/sprain.  Skin: Denies pruritus, rash, hives, warts, acne, eczema or change in skin lesion(s). Neuro: No weakness, tremor, incoordination, spasms, paresthesia or pain. Psychiatric: Denies confusion, memory loss or sensory loss. Endo: Denies change in weight, skin or hair change.  Heme/Lymph: No excessive bleeding, bruising or enlarged lymph nodes.  Exam:  BP 142/84  P 84  T 97.9 F    Resp 18  Ht 5' 7.5"   Wt 362 lb 12.8 oz   BMI 55.95 kg/m2  Appears well nourished and in no distress. Eyes: PERRLA, EOMs, conjunctiva no swelling or erythema. Sinuses: No frontal/maxillary tenderness ENT/Mouth: EAC's clear, TM's nl w/o erythema, bulging. Nares clear w/o erythema, swelling, exudates. Oropharynx clear without erythema or exudates. Oral hygiene is good. Tongue normal, non obstructing. Hearing intact.  Neck: Supple. Thyroid nl. Car 2+/2+ without bruits, nodes or JVD. Chest: Respirations nl with BS clear & equal w/o rales, rhonchi, wheezing or stridor.  Cor: Heart sounds normal w/ regular rate and rhythm without sig. murmurs, gallops, clicks, or rubs. Peripheral pulses normal and equal  without edema.  Abdomen: Soft & bowel sounds normal. Non-tender w/o guarding, rebound, hernias, masses, or organomegaly.  Lymphatics: Unremarkable.  Musculoskeletal: Full ROM all peripheral extremities, joint stability, 5/5 strength, and normal gait.  Skin: Warm, dry without exposed rashes, lesions or ecchymosis apparent.  Neuro: Cranial nerves intact, reflexes equal bilaterally. Sensory-motor testing grossly intact. Tendon reflexes grossly intact.  Pysch: Alert & oriented x 3. Insight and judgement nl & appropriate. No ideations.  Assessment and Plan:  1. Hypertension - Continue monitor blood pressure at home. Continue diet/meds same.  2. Hyperlipidemia - Continue diet/meds, exercise,& lifestyle modifications. Continue monitor periodic cholesterol/liver & renal functions   3. Pre-Diabetes - Continue diet, and recc weight loss, exercise, lifestyle modifications. Monitor appropriate labs.  4. Vitamin D Deficiency - Continue supplementation.  Recommended regular exercise, BP monitoring, weight control, and discussed med and SE's. Recommended labs to assess and monitor clinical status. Further disposition pending results of labs.

## 2013-11-22 LAB — BASIC METABOLIC PANEL WITH GFR
BUN: 11 mg/dL (ref 6–23)
CALCIUM: 8.9 mg/dL (ref 8.4–10.5)
CO2: 25 mEq/L (ref 19–32)
Chloride: 101 mEq/L (ref 96–112)
Creat: 0.7 mg/dL (ref 0.50–1.10)
GFR, Est Non African American: >89 mL/min
GLUCOSE: 111 mg/dL — AB (ref 70–99)
Potassium: 4.2 mEq/L (ref 3.5–5.3)
Sodium: 139 mEq/L (ref 135–145)

## 2013-11-22 LAB — TSH: TSH: 2.427 u[IU]/mL (ref 0.350–4.500)

## 2013-11-22 LAB — URIC ACID: Uric Acid, Serum: 8.3 mg/dL — ABNORMAL HIGH (ref 2.4–7.0)

## 2013-11-22 LAB — LIPID PANEL
CHOLESTEROL: 158 mg/dL (ref 0–200)
HDL: 54 mg/dL (ref >39)
LDL Cholesterol: 81 mg/dL (ref 0–99)
Total CHOL/HDL Ratio: 2.9 Ratio
Triglycerides: 117 mg/dL (ref <150)
VLDL: 23 mg/dL (ref 0–40)

## 2013-11-22 LAB — HEPATIC FUNCTION PANEL
ALBUMIN: 4 g/dL (ref 3.5–5.2)
ALT: 19 U/L (ref 0–35)
AST: 18 U/L (ref 0–37)
Alkaline Phosphatase: 108 U/L (ref 39–117)
Bilirubin, Direct: 0.2 mg/dL (ref 0.0–0.3)
Indirect Bilirubin: 0.6 mg/dL (ref 0.2–1.2)
TOTAL PROTEIN: 7.3 g/dL (ref 6.0–8.3)
Total Bilirubin: 0.8 mg/dL (ref 0.2–1.2)

## 2013-11-22 LAB — VITAMIN D 25 HYDROXY (VIT D DEFICIENCY, FRACTURES): Vit D, 25-Hydroxy: 24 ng/mL — ABNORMAL LOW (ref 30–89)

## 2013-11-22 LAB — MAGNESIUM: MAGNESIUM: 1.9 mg/dL (ref 1.5–2.5)

## 2013-11-22 LAB — INSULIN, FASTING: INSULIN FASTING, SERUM: 10 u[IU]/mL (ref 3–28)

## 2013-11-28 ENCOUNTER — Other Ambulatory Visit: Payer: Self-pay | Admitting: *Deleted

## 2013-11-28 MED ORDER — VITAMIN D (ERGOCALCIFEROL) 1.25 MG (50000 UNIT) PO CAPS: ORAL_CAPSULE | ORAL | Status: DC

## 2013-11-28 NOTE — Telephone Encounter (Signed)
Spoke with patient regarding low Vitamin D level on 11/27/2013.  Per Dr Melford Aase, patient needs to start Vitamin D 1.25 mg and rx sent to [facility].  Left message to inform patient of RX at her request.

## 2014-01-13 ENCOUNTER — Other Ambulatory Visit: Payer: Self-pay | Admitting: Internal Medicine

## 2014-03-07 ENCOUNTER — Ambulatory Visit (INDEPENDENT_AMBULATORY_CARE_PROVIDER_SITE_OTHER): Payer: 59 | Admitting: Physician Assistant

## 2014-03-07 VITALS — BP 130/78 | HR 80 | Temp 97.7°F | Resp 16 | Wt 351.0 lb

## 2014-03-07 DIAGNOSIS — M1A9XX0 Chronic gout, unspecified, without tophus (tophi): Secondary | ICD-10-CM

## 2014-03-07 DIAGNOSIS — I10 Essential (primary) hypertension: Secondary | ICD-10-CM

## 2014-03-07 DIAGNOSIS — E785 Hyperlipidemia, unspecified: Secondary | ICD-10-CM

## 2014-03-07 DIAGNOSIS — D573 Sickle-cell trait: Secondary | ICD-10-CM

## 2014-03-07 DIAGNOSIS — M170 Bilateral primary osteoarthritis of knee: Secondary | ICD-10-CM

## 2014-03-07 DIAGNOSIS — M25561 Pain in right knee: Secondary | ICD-10-CM

## 2014-03-07 DIAGNOSIS — R7309 Other abnormal glucose: Secondary | ICD-10-CM

## 2014-03-07 DIAGNOSIS — Z79899 Other long term (current) drug therapy: Secondary | ICD-10-CM

## 2014-03-07 DIAGNOSIS — E559 Vitamin D deficiency, unspecified: Secondary | ICD-10-CM

## 2014-03-07 DIAGNOSIS — M25562 Pain in left knee: Secondary | ICD-10-CM

## 2014-03-07 LAB — CBC WITH DIFFERENTIAL/PLATELET
BASOS ABS: 0 10*3/uL (ref 0.0–0.1)
Basophils Relative: 0 % (ref 0–1)
EOS ABS: 0.1 10*3/uL (ref 0.0–0.7)
Eosinophils Relative: 2 % (ref 0–5)
HCT: 38.4 % (ref 36.0–46.0)
Hemoglobin: 12.7 g/dL (ref 12.0–15.0)
LYMPHS ABS: 1.9 10*3/uL (ref 0.7–4.0)
LYMPHS PCT: 30 % (ref 12–46)
MCH: 27.7 pg (ref 26.0–34.0)
MCHC: 33.1 g/dL (ref 30.0–36.0)
MCV: 83.8 fL (ref 78.0–100.0)
Monocytes Absolute: 0.5 10*3/uL (ref 0.1–1.0)
Monocytes Relative: 8 % (ref 3–12)
NEUTROS PCT: 60 % (ref 43–77)
Neutro Abs: 3.8 10*3/uL (ref 1.7–7.7)
PLATELETS: 227 10*3/uL (ref 150–400)
RBC: 4.58 MIL/uL (ref 3.87–5.11)
RDW: 14.5 % (ref 11.5–15.5)
WBC: 6.3 10*3/uL (ref 4.0–10.5)

## 2014-03-07 LAB — HEMOGLOBIN A1C
Hgb A1c MFr Bld: 5.5 % (ref <5.7)
Mean Plasma Glucose: 111 mg/dL (ref <117)

## 2014-03-07 MED ORDER — VITAMIN D (ERGOCALCIFEROL) 1.25 MG (50000 UNIT) PO CAPS: ORAL_CAPSULE | ORAL | Status: DC

## 2014-03-07 MED ORDER — BUMETANIDE 1 MG PO TABS: 1.0000 mg | ORAL_TABLET | ORAL | Status: DC | PRN

## 2014-03-07 MED ORDER — HYDROCODONE-ACETAMINOPHEN 5-325 MG PO TABS: ORAL_TABLET | ORAL | Status: DC

## 2014-03-07 NOTE — Patient Instructions (Signed)
   Bad carbs also include fruit juice, alcohol, and sweet tea. These are empty calories that do not signal to your brain that you are full.   Please remember the good carbs are still carbs which convert into sugar. So please measure them out no more than 1/2-1 cup of rice, oatmeal, pasta, and beans.  Veggies are however free foods! Pile them on.   I like lean protein at every meal such as chicken, [place], pork chops, cottage cheese, etc. Just do not fry these meats and please center your meal around vegetable, the meats should be a side dish.   No all fruit is created equal. Please see the list below, the fruit at the bottom is higher in sugars than the fruit at the top   We want weight loss that will last so you should lose 1-2 pounds a week.  THAT IS IT! Please pick THREE things a month to change. Once it is a habit check off the item. Then pick another three items off the list to become habits.  If you are already doing a habit on the list GREAT!  Cross that item off! o Don't drink your calories. Ie, alcohol, soda, fruit juice, and sweet tea.  o Drink more water. Drink a glass when you feel hungry or before each meal.  o Eat breakfast - Complex carb and protein (likeDannon light and fit yogurt, oatmeal, fruit, eggs, [place] bacon). o Measure your cereal.  Eat no more than one cup a day. (ie [place]) o Eat an apple a day. o Add a vegetable a day. o Try a new vegetable a month. o Use Pam! Stop using oil or butter to cook. o Don't finish your plate or use smaller plates. o Share your dessert. o Eat sugar free Jello for dessert or frozen grapes. o Don't eat 2-3 hours before bed. o Switch to whole wheat bread, pasta, and brown rice. o Make healthier choices when you eat out. No fries! o Pick baked chicken, NOT fried. o Don't forget to SLOW DOWN when you eat. It is not going anywhere.  o Take the stairs. o Park far away in the parking lot o [employer] (or weights) for 10 minutes while  watching TV. o Walk at work for 10 minutes during break. o Walk outside 1 time a week with your friend, kids, dog, or significant other. o Start a walking group at [place] the mall as much as you can tolerate.  o Keep a food diary. o Weigh yourself daily. o Walk for 15 minutes 3 days per week. o Cook at home more often and eat out less.  If life happens and you go back to old habits, it is okay.  Just start over. You can do it!   If you experience chest pain, get short of breath, or tired during the exercise, please stop immediately and inform your doctor.

## 2014-03-07 NOTE — Progress Notes (Signed)
Assessment and Plan:  Hypertension: Continue medication, monitor blood pressure at home. Continue DASH diet. Cholesterol: Continue diet and exercise. Check cholesterol.  Pre-diabetes-Continue diet and exercise. Check A1C Vitamin D Def- check level and continue medications.  Knee Pain,(Degenrative Arthritis of the knee, MCL) Natural history and expected course discussed. Questions answered. Rest, ice, compression, and elevation (RICE) therapy. Patellar compression sleeve. Orthopedics referral. Obesity with co morbidities- long discussion about weight loss, diet, and exercise Cough- lungs CTAB, O2 98%, continue coricidin will call if worse  Continue diet and meds as discussed. Further disposition pending results of labs. OVER 40 minutes of exam, counseling, chart review, referral performed   HPI 58 y.o. female  presents for 3 month follow up with hypertension, hyperlipidemia, prediabetes and vitamin D. Her blood pressure has been controlled at home, today their BP is BP: 130/78 mmHg She does not workout. She denies chest pain, shortness of breath, dizziness.  She is not on cholesterol medication and denies myalgias. Her cholesterol is at goal. The cholesterol last visit was:   Lab Results  Component Value Date   CHOL 158 11/21/2013   HDL 54 11/21/2013   LDLCALC 81 11/21/2013   TRIG 117 11/21/2013   CHOLHDL 2.9 11/21/2013   She has been working on diet and exercise for prediabetes, and denies paresthesia of the feet, polydipsia and polyuria. Last A1C in the office was:  Lab Results  Component Value Date   HGBA1C 5.9* 11/21/2013   Patient is on Vitamin D supplement.   Lab Results  Component Value Date   VD25OH 24* 11/21/2013     She complains of bilateral knee pain, right worse than left. Denies injury, she has had several episodes of feeling like her right knee gives way on her, denies popping, clicking, or locking. She states it feels like it is warm with swelling/tightness. Denies distal  swelling. She has had some pain in bilateral hips after walking a lot at a conference but that has felt better. She has gout and is on cholesterol medication. She is on Bumex.  She had cold symptoms last week, took Coricidin and some left over hydrocodone, she still has some cough/wheezing but she is feeling better.   Current Medications:  Current Outpatient Prescriptions on File Prior to Visit  Medication Sig Dispense Refill  . allopurinol (ZYLOPRIM) 300 MG tablet Take 1 tablet (300 mg total) by mouth daily.  90 tablet  2  . ALPRAZolam (XANAX) 0.5 MG tablet Take 0.5 mg by mouth 2 (two) times daily as needed for anxiety.      Marland Kitchen aspirin 81 MG tablet Take 81 mg by mouth daily.      . bumetanide (BUMEX) 1 MG tablet Take 1 mg by mouth as needed.      . doxazosin (CARDURA) 4 MG tablet TAKE 1 TO 2 TABLETS BY MOUTH AT BEDTIME FOR BLOOD PRESSURE  180 tablet  99  . HYDROcodone-acetaminophen (NORCO/VICODIN) 5-325 MG per tablet 1- 2 tabs every 4-6 hours prn pain  60 tablet  0  . losartan (COZAAR) 100 MG tablet Take 100 mg by mouth daily.      . naproxen sodium (ANAPROX) 220 MG tablet Take 220 mg by mouth daily as needed.      . potassium chloride (K-DUR,KLOR-CON) 10 MEQ tablet Take 20 mEq by mouth daily.      . Vitamin D, Ergocalciferol, (DRISDOL) 50000 UNITS CAPS capsule Take 1 capsule daily for Vitamin D deficiency  30 capsule  11   No  current facility-administered medications on file prior to visit.   Medical History:  Past Medical History  Diagnosis Date  . Obesity   . Anemia   . Hyperlipidemia   . Hypertension   . Sickle cell trait   . Elevated hemoglobin A1c   . DJD (degenerative joint disease)   . DDD (degenerative disc disease)   . GERD (gastroesophageal reflux disease)    Allergies:  Allergies  Allergen Reactions  . Ace Inhibitors     Cough  . Paxil [Paroxetine Hcl]     Mood swings  . Prednisone     Nausea/ irratated  . Vitamin D Analogs     Cramping     Review of Systems:  [X]  = complains of  [ ]  = denies  General: Fatigue [ ]  Fever [ ]  Chills [ ]  Weakness [ ]   Insomnia [ ]  Eyes: Redness [ ]  Blurred vision [ ]  Diplopia [ ]   ENT: Congestion [ ]  Sinus Pain [ ]  Post Nasal Drip [ ]  Sore Throat [ ]  Earache [ ]   Cardiac: Chest pain/pressure [ ]  SOB [ ]  Orthopnea [ ]   Palpitations [ ]   Paroxysmal nocturnal dyspnea[ ]  Claudication [ ]  Edema [ ]   Pulmonary: Cough [ ]  Wheezing[ ]   SOB [ ]   Snoring [ ]   GI: Nausea [ ]  Vomiting[ ]  Dysphagia[ ]  Heartburn[ ]  Abdominal pain [ ]  Constipation [ ] ; Diarrhea [ ] ; BRBPR [ ]  Melena[ ]  GU: Hematuria[ ]  Dysuria [ ]  Nocturia[ ]  Urgency [ ]   Hesitancy [ ]  Discharge [ ]  Neuro: Headaches[ ]  Vertigo[ ]  Paresthesias[ ]  Spasm [ ]  Speech changes [ ]  Incoordination [ ]   Ortho: Arthritis [x ] Joint pain [x ] Muscle pain [ ]  Joint swelling [ ]  Back Pain [ ]  Skin:  Rash [ ]   Pruritis [ ]  Change in skin lesion [ ]   Psych: Depression[ ]  Anxiety[ ]  Confusion [ ]  Memory loss [ ]   Heme/Lypmh: Bleeding [ ]  Bruising [ ]  Enlarged lymph nodes [ ]   Endocrine: Visual blurring [ ]  Paresthesia [ ]  Polyuria [ ]  Polydypsea [ ]    Heat/cold intolerance [ ]  Hypoglycemia [ ]   Family history- Review and unchanged Social history- Review and unchanged Physical Exam: BP 130/78  Pulse 80  Temp(Src) 97.7 F (36.5 C)  Resp 16  Wt 351 lb (159.213 kg) Wt Readings from Last 3 Encounters:  03/07/14 351 lb (159.213 kg)  11/21/13 362 lb 12.8 oz (164.565 kg)  08/21/13 349 lb (158.305 kg)   General Appearance: Well nourished, in no apparent distress. Eyes: PERRLA, EOMs, conjunctiva no swelling or erythema Sinuses: No Frontal/maxillary tenderness ENT/Mouth: Ext aud canals clear, TMs without erythema, bulging. No erythema, swelling, or exudate on post pharynx.  Tonsils not swollen or erythematous. Hearing normal.  Neck: Supple, thyroid normal.  Respiratory: Respiratory effort normal, BS equal bilaterally without rales, rhonchi, wheezing or stridor.  Cardio: RRR with  no MRGs. Brisk peripheral pulses without edema.  Abdomen: Soft, + BS.  Non tender, no guarding, rebound, hernias, masses. Lymphatics: Non tender without lymphadenopathy.  Musculoskeletal: Full ROM, 5/5 strength, normal gait. right knee with medial joint line tenderness, lateral joint line tenderness, MCL laxity noted and tenderness noted entire area no effusion and no erythema Skin: Warm, dry without rashes, lesions, ecchymosis.  Neuro: Cranial nerves intact. Normal muscle tone, no cerebellar symptoms. Sensation intact.  Psych: Awake and oriented X 3, normal affect, Insight and Judgment appropriate.    Vicie Mutters 10:51 AM

## 2014-03-08 LAB — BASIC METABOLIC PANEL WITH GFR
BUN: 12 mg/dL (ref 6–23)
CHLORIDE: 100 meq/L (ref 96–112)
CO2: 25 mEq/L (ref 19–32)
CREATININE: 0.66 mg/dL (ref 0.50–1.10)
Calcium: 9.6 mg/dL (ref 8.4–10.5)
Glucose, Bld: 121 mg/dL — ABNORMAL HIGH (ref 70–99)
POTASSIUM: 4.3 meq/L (ref 3.5–5.3)
Sodium: 138 mEq/L (ref 135–145)

## 2014-03-08 LAB — LIPID PANEL
CHOL/HDL RATIO: 2.8 ratio
Cholesterol: 164 mg/dL (ref 0–200)
HDL: 58 mg/dL (ref >39)
LDL Cholesterol: 89 mg/dL (ref 0–99)
Triglycerides: 85 mg/dL (ref <150)
VLDL: 17 mg/dL (ref 0–40)

## 2014-03-08 LAB — HEPATIC FUNCTION PANEL
ALK PHOS: 106 U/L (ref 39–117)
ALT: 20 U/L (ref 0–35)
AST: 19 U/L (ref 0–37)
Albumin: 4.3 g/dL (ref 3.5–5.2)
BILIRUBIN DIRECT: 0.2 mg/dL (ref 0.0–0.3)
BILIRUBIN TOTAL: 0.9 mg/dL (ref 0.2–1.2)
Indirect Bilirubin: 0.7 mg/dL (ref 0.2–1.2)
Total Protein: 7.8 g/dL (ref 6.0–8.3)

## 2014-03-08 LAB — MAGNESIUM: Magnesium: 1.9 mg/dL (ref 1.5–2.5)

## 2014-03-08 LAB — TSH: TSH: 1.842 u[IU]/mL (ref 0.350–4.500)

## 2014-03-08 LAB — URIC ACID: URIC ACID, SERUM: 6.8 mg/dL (ref 2.4–7.0)

## 2014-03-08 LAB — INSULIN, FASTING: INSULIN FASTING, SERUM: 16.2 u[IU]/mL (ref 2.0–19.6)

## 2014-03-08 LAB — VITAMIN D 25 HYDROXY (VIT D DEFICIENCY, FRACTURES): VIT D 25 HYDROXY: 17 ng/mL — AB (ref 30–89)

## 2014-03-12 ENCOUNTER — Ambulatory Visit: Payer: Self-pay | Admitting: Physician Assistant

## 2014-03-13 ENCOUNTER — Other Ambulatory Visit: Payer: Self-pay | Admitting: Physician Assistant

## 2014-03-13 MED ORDER — DICLOFENAC SODIUM 1 % TD GEL
4.0000 g | Freq: Four times a day (QID) | TRANSDERMAL | Status: DC
Start: 2014-03-13 — End: 2015-10-02

## 2014-03-23 ENCOUNTER — Ambulatory Visit (HOSPITAL_COMMUNITY)
Admission: RE | Admit: 2014-03-23 | Discharge: 2014-03-23 | Disposition: A | Discharge status: Home / Self Care | Payer: 59 | Source: Ambulatory Visit | Attending: Emergency Medicine | Admitting: Emergency Medicine

## 2014-03-23 ENCOUNTER — Encounter (HOSPITAL_COMMUNITY): Payer: Self-pay | Admitting: Emergency Medicine

## 2014-03-23 ENCOUNTER — Other Ambulatory Visit (HOSPITAL_COMMUNITY): Payer: Self-pay | Admitting: Emergency Medicine

## 2014-03-23 ENCOUNTER — Emergency Department (HOSPITAL_COMMUNITY)
Admission: EM | Admit: 2014-03-23 | Discharge: 2014-03-23 | Disposition: A | Discharge status: Home / Self Care | Payer: 59 | Attending: Emergency Medicine | Admitting: Emergency Medicine

## 2014-03-23 ENCOUNTER — Emergency Department (HOSPITAL_COMMUNITY): Payer: 59

## 2014-03-23 DIAGNOSIS — E669 Obesity, unspecified: Secondary | ICD-10-CM | POA: Diagnosis not present

## 2014-03-23 DIAGNOSIS — R109 Unspecified abdominal pain: Secondary | ICD-10-CM

## 2014-03-23 DIAGNOSIS — Z7982 Long term (current) use of aspirin: Secondary | ICD-10-CM | POA: Diagnosis not present

## 2014-03-23 DIAGNOSIS — Z8739 Personal history of other diseases of the musculoskeletal system and connective tissue: Secondary | ICD-10-CM | POA: Diagnosis not present

## 2014-03-23 DIAGNOSIS — Z862 Personal history of diseases of the blood and blood-forming organs and certain disorders involving the immune mechanism: Secondary | ICD-10-CM | POA: Diagnosis not present

## 2014-03-23 DIAGNOSIS — Z791 Long term (current) use of non-steroidal anti-inflammatories (NSAID): Secondary | ICD-10-CM | POA: Insufficient documentation

## 2014-03-23 DIAGNOSIS — I10 Essential (primary) hypertension: Secondary | ICD-10-CM | POA: Diagnosis not present

## 2014-03-23 DIAGNOSIS — Z79899 Other long term (current) drug therapy: Secondary | ICD-10-CM | POA: Insufficient documentation

## 2014-03-23 DIAGNOSIS — Z8719 Personal history of other diseases of the digestive system: Secondary | ICD-10-CM | POA: Insufficient documentation

## 2014-03-23 DIAGNOSIS — R1031 Right lower quadrant pain: Secondary | ICD-10-CM | POA: Insufficient documentation

## 2014-03-23 LAB — CBC
HEMATOCRIT: 39.8 % (ref 36.0–46.0)
HEMOGLOBIN: 13.2 g/dL (ref 12.0–15.0)
MCH: 28 pg (ref 26.0–34.0)
MCHC: 33.2 g/dL (ref 30.0–36.0)
MCV: 84.5 fL (ref 78.0–100.0)
Platelets: 255 10*3/uL (ref 150–400)
RBC: 4.71 MIL/uL (ref 3.87–5.11)
RDW: 13.3 % (ref 11.5–15.5)
WBC: 9.3 10*3/uL (ref 4.0–10.5)

## 2014-03-23 LAB — COMPREHENSIVE METABOLIC PANEL
ALT: 31 U/L (ref 0–35)
ANION GAP: 13 (ref 5–15)
AST: 29 U/L (ref 0–37)
Albumin: 4.2 g/dL (ref 3.5–5.2)
Alkaline Phosphatase: 113 U/L (ref 39–117)
BUN: 16 mg/dL (ref 6–23)
CO2: 27 mEq/L (ref 19–32)
Calcium: 9.6 mg/dL (ref 8.4–10.5)
Chloride: 95 mEq/L — ABNORMAL LOW (ref 96–112)
Creatinine, Ser: 0.73 mg/dL (ref 0.50–1.10)
GFR calc non Af Amer: >90 mL/min (ref >90)
GLUCOSE: 101 mg/dL — AB (ref 70–99)
Potassium: 4.4 mEq/L (ref 3.7–5.3)
Sodium: 135 mEq/L — ABNORMAL LOW (ref 137–147)
TOTAL PROTEIN: 8.5 g/dL — AB (ref 6.0–8.3)
Total Bilirubin: 1.2 mg/dL (ref 0.3–1.2)

## 2014-03-23 LAB — LIPASE, BLOOD: LIPASE: 47 U/L (ref 11–59)

## 2014-03-23 MED ORDER — HYDROCODONE-ACETAMINOPHEN 5-325 MG PO TABS: 1.0000 | ORAL_TABLET | ORAL | Status: DC | PRN

## 2014-03-23 MED ORDER — ONDANSETRON HCL 4 MG/2ML IJ SOLN
4.0000 mg | Freq: Once | INTRAMUSCULAR | Status: AC
  Administered 2014-03-23: 4 mg via INTRAVENOUS
  Filled 2014-03-23: qty 2

## 2014-03-23 MED ORDER — ONDANSETRON 8 MG PO TBDP: 8.0000 mg | ORAL_TABLET | Freq: Three times a day (TID) | ORAL | Status: DC | PRN

## 2014-03-23 MED ORDER — HYDROMORPHONE HCL 1 MG/ML IJ SOLN
1.0000 mg | Freq: Once | INTRAMUSCULAR | Status: AC
  Administered 2014-03-23: 1 mg via INTRAVENOUS
  Filled 2014-03-23: qty 1

## 2014-03-23 NOTE — ED Notes (Signed)
Pt is aware that a urine sample is needed.  

## 2014-03-23 NOTE — Discharge Instructions (Signed)

## 2014-03-23 NOTE — ED Provider Notes (Signed)
CSN: [id number]     Arrival date & time 03/23/14  1336 History   First MD Initiated Contact with Patient 03/23/14 1343     Chief Complaint  Patient presents with  . Flank Pain    right  . Nausea      HPI Patient presents to the emergency department complaining of right flank pain as well as some right-sided abdominal pain over the past 4-5 days.  Pain and discomfort as being constant.  No history kidney stones.  No fevers or chills.  Nausea without vomiting.  Denies diarrhea.  Was seen at the urgent care earlier today and was sent to the emergency department to have a CT scan done to evaluate for possible kidney stone.  Urinalysis was completed at the urgent care center and there was a small amount of hematuria without signs of infection.  Pain is mild to moderate in severity at this time.  No rash.  No trauma.  No recent illness.  No right upper quadrant abdominal pain   Past Medical History  Diagnosis Date  . Obesity   . Anemia   . Hyperlipidemia   . Hypertension   . Sickle cell trait   . Elevated hemoglobin A1c   . DJD (degenerative joint disease)   . DDD (degenerative disc disease)   . GERD (gastroesophageal reflux disease)    Past Surgical History  Procedure Laterality Date  . Shoulder surgery Left 2007   Family History  Problem Relation Age of Onset  . Heart disease Mother   . Cancer Father     prostate  . Multiple myeloma Father   . Diabetes Sister   . Mental illness Sister   . Diabetes Brother   . Obstructive Sleep Apnea Brother    History  Substance Use Topics  . Smoking status: Never Smoker   . Smokeless tobacco: Not on file  . Alcohol Use: 3.0 oz/week    6 drink(s) per week   OB History   Grav Para Term Preterm Abortions TAB SAB Ect Mult Living                 Review of Systems  All other systems reviewed and are negative.     Allergies  Ace inhibitors; Paxil; Prednisone; and Vitamin d analogs  Home Medications   Prior to Admission  medications   Medication Sig Start Date End Date Taking? Authorizing Provider  allopurinol (ZYLOPRIM) 300 MG tablet Take 1 tablet (300 mg total) by mouth daily. 04/12/13 04/12/14 Yes Melissa R Smith, PA-C  ALPRAZolam Duanne Moron) 0.5 MG tablet Take 0.5 mg by mouth 2 (two) times daily as needed for anxiety.   Yes Historical Provider, MD  aspirin 81 MG tablet Take 81 mg by mouth daily.   Yes Historical Provider, MD  bumetanide (BUMEX) 1 MG tablet Take 1 tablet (1 mg total) by mouth as needed. 03/07/14  Yes Vicie Mutters, PA-C  diclofenac sodium (VOLTAREN) 1 % GEL Apply 4 g topically 4 (four) times daily. 03/13/14  Yes Vicie Mutters, PA-C  doxazosin (CARDURA) 4 MG tablet Take 4 mg by mouth at bedtime.   Yes Historical Provider, MD  HYDROcodone-acetaminophen (NORCO/VICODIN) 5-325 MG per tablet Take 1-2 tablets by mouth every 6 (six) hours as needed for moderate pain.   Yes Historical Provider, MD  losartan (COZAAR) 100 MG tablet Take 100 mg by mouth daily.   Yes Historical Provider, MD  naproxen sodium (ANAPROX) 220 MG tablet Take 220 mg by mouth daily as needed.  Yes Historical Provider, MD  HYDROcodone-acetaminophen (NORCO/VICODIN) 5-325 MG per tablet Take 1 tablet by mouth every 4 (four) hours as needed for moderate pain. 03/23/14   Hoy Morn, MD  ondansetron (ZOFRAN ODT) 8 MG disintegrating tablet Take 1 tablet (8 mg total) by mouth every 8 (eight) hours as needed for nausea or vomiting. 03/23/14   Hoy Morn, MD   BP 168/93  Pulse 62  Temp(Src) 97.6 F (36.4 C) (Oral)  Resp 18  SpO2 100% Physical Exam  Nursing note and vitals reviewed. Constitutional: She is oriented to person, place, and time. She appears well-developed and well-nourished. No distress.  HENT:  Head: Normocephalic and atraumatic.  Eyes: EOM are normal.  Neck: Normal range of motion.  Cardiovascular: Normal rate, regular rhythm and normal heart sounds.   Pulmonary/Chest: Effort normal and breath sounds normal.   Abdominal: Soft. She exhibits no distension.  Mild right-sided abdominal tenderness and right lower quadrant abdominal tenderness without tenderness in the right upper quadrant.  No peritonitis.  Musculoskeletal: Normal range of motion.  Neurological: She is alert and oriented to person, place, and time.  Skin: Skin is warm and dry.  Psychiatric: She has a normal mood and affect. Judgment normal.    ED Course  Procedures (including critical care time) Labs Review Labs Reviewed  COMPREHENSIVE METABOLIC PANEL - Abnormal; Notable for the following:    Sodium 135 (*)    Chloride 95 (*)    Glucose, Bld 101 (*)    Total Protein 8.5 (*)    All other components within normal limits  CBC  LIPASE, BLOOD    Imaging Review Ct Renal Stone Study  03/23/2014   CLINICAL DATA:  Right flank pain for 2 days.  Nausea  EXAM: CT ABDOMEN AND PELVIS WITHOUT CONTRAST  TECHNIQUE: Multidetector CT imaging of the abdomen and pelvis was performed following the standard protocol without oral or intravenous contrast maternal administration.  COMPARISON:  None.  FINDINGS: Lungs are clear. There is a small posterior hernia along the posterior right hemidiaphragm containing only fat.  Liver is prominent, measuring 19.5 cm in length. No focal liver lesions are identified on this noncontrast enhanced study. Gallbladder wall is not thickened. There is no appreciable biliary duct dilatation.  Spleen, pancreas, and adrenals appear normal. Kidneys bilaterally show no mass or hydronephrosis on either side. There is no appreciable renal or ureteral calculus on either side.  In the pelvis, the urinary bladder is midline with normal wall thickness. There is no pelvic mass or fluid. Appendix appears normal. Terminal ileum appears normal.  There is no bowel obstruction.  No free air or portal venous air.  There is no appreciable ascites, adenopathy, or abscess in the abdomen or pelvis. There is no demonstrable abdominal aortic  aneurysm. There is degenerative change in the lumbar spine. There are no blastic or lytic bone lesions.  IMPRESSION: There is no mesenteric inflammation or abscess. No bowel obstruction. Appendix region appears normal.  No renal or ureteral calculus.  No hydronephrosis.  Liver enlarged but otherwise unremarkable on this noncontrast enhanced study.   Electronically Signed   By: Lowella Grip M.D.   On: 03/23/2014 15:15     EKG Interpretation None      MDM   Final diagnoses:  Abdominal pain  Right sided abdominal pain    Patient is feeling much better at this time.  Urinalysis completed her daycare demonstrates no signs of infection.  CT scan without acute pathology at this time.  Labs without significant abnormality.  Pain is controlled.  PCP follow-up.  Patient understands to return to the ER for new or worsening symptoms    Hoy Morn, MD 03/23/14 [phone]

## 2014-03-23 NOTE — ED Notes (Signed)
Per pt, states right flank pain since Thurs-no dysuria

## 2014-03-25 ENCOUNTER — Other Ambulatory Visit: Payer: Self-pay

## 2014-03-25 DIAGNOSIS — N200 Calculus of kidney: Secondary | ICD-10-CM

## 2014-03-26 ENCOUNTER — Other Ambulatory Visit: Payer: Self-pay | Admitting: Internal Medicine

## 2014-03-26 MED ORDER — HYDROCODONE-ACETAMINOPHEN 5-325 MG PO TABS: ORAL_TABLET | ORAL | Status: DC

## 2014-04-03 ENCOUNTER — Other Ambulatory Visit: Payer: Self-pay | Admitting: *Deleted

## 2014-04-03 MED ORDER — HYDROCODONE-ACETAMINOPHEN 5-325 MG PO TABS: ORAL_TABLET | ORAL | Status: DC

## 2014-04-10 ENCOUNTER — Encounter: Payer: Self-pay | Admitting: Internal Medicine

## 2014-04-10 ENCOUNTER — Ambulatory Visit (INDEPENDENT_AMBULATORY_CARE_PROVIDER_SITE_OTHER): Payer: 59 | Admitting: Internal Medicine

## 2014-04-10 VITALS — BP 132/78 | HR 84 | Temp 98.1°F | Resp 16 | Ht 67.5 in | Wt 338.0 lb

## 2014-04-10 DIAGNOSIS — M7071 Other bursitis of hip, right hip: Secondary | ICD-10-CM

## 2014-04-10 MED ORDER — DEXAMETHASONE SODIUM PHOSPHATE 10 MG/ML IJ SOLN: 10.0000 mg | Freq: Once | INTRAMUSCULAR | Status: DC

## 2014-04-10 MED ORDER — DEXAMETHASONE SODIUM PHOSPHATE 100 MG/10ML IJ SOLN
10.0000 mg | Freq: Once | INTRAMUSCULAR | Status: AC
  Administered 2014-04-10: 10 mg via INTRAMUSCULAR

## 2014-04-10 MED ORDER — PREDNISONE 20 MG PO TABS: ORAL_TABLET | ORAL | Status: AC

## 2014-04-10 MED ORDER — TRAMADOL HCL 50 MG PO TABS: ORAL_TABLET | ORAL | Status: DC

## 2014-04-10 NOTE — Progress Notes (Signed)
   Subjective:    Patient ID: Alicia Diaz, female    DOB: [birth year], 58 y.o.   MRN: [id number]  HPI Patient presents with 3 week hx/o right flank & hip pain and was initially seen in [facility] with blood in urine and referred to [facility] and had a negative Abd/Pelvic CT scan. Then she was referred to urology which cleared her for urological problems and referred her to her Gyn - Dr Ulanda Edison who like suspected no GYN pathology. She presents today with c/o aching pains in the low right flank/hip area.  Medication Sig  . allopurinol (ZYLOPRIM) 300 MG tablet Take 1 tablet (300 mg total) by mouth daily.  Marland Kitchen ALPRAZolam (XANAX) 0.5 MG tablet Take 0.5 mg by mouth 2 (two) times daily as needed for anxiety.  Marland Kitchen aspirin 81 MG tablet Take 81 mg by mouth daily.  . bumetanide (BUMEX) 1 MG tablet Take 1 tablet (1 mg total) by mouth as needed.  . diclofenac sodium (VOLTAREN) 1 % GEL Apply 4 g topically 4 (four) times daily.  Marland Kitchen doxazosin (CARDURA) 4 MG tablet Take 4 mg by mouth at bedtime.  Marland Kitchen losartan (COZAAR) 100 MG tablet Take 100 mg by mouth daily.  . naproxen sodium (ANAPROX) 220 MG tablet Take 220 mg by mouth daily as needed.  Marland Kitchen HYDROcodone-acetaminophen (NORCO) 5-325 MG per tablet Take 1/2 to 1 tablet every 3 to 4 hours as needed for pain  . ondansetron (ZOFRAN ODT) 8 MG disintegrating tablet Take 1 tablet (8 mg total) by mouth every 8 (eight) hours as needed for nausea or vomiting.   Allergies  Allergen Reactions  . Ace Inhibitors     Cough  . Paxil [Paroxetine Hcl]     Mood swings  . Prednisone     Nausea/ irratated  . Vitamin D Analogs     Cramping   Past Medical History  Diagnosis Date  . Obesity   . Anemia   . Hyperlipidemia   . Hypertension   . Sickle cell trait   . Elevated hemoglobin A1c   . DJD (degenerative joint disease)   . DDD (degenerative disc disease)   . GERD (gastroesophageal reflux disease)    Review of Systems Non contributory to above.    Objective:    Physical Exam BP 132/78 mmHg  Pulse 84  Temp(Src) 98.1 F (36.7 C)  Resp 16  Ht 5' 7.5" (1.715 m)  Wt 338 lb (153.316 kg)  BMI 52.13 kg/m2  Exam finds moderate localized tenderness of the right hip greater trochanteric bursa area with anatomy somewhat obscured by generously thick subcutaneous layer. There is pain in this area with both internal and external hip rotation.  Dexamethasone 10 mg was infiltrated into the tender area.     Assessment & Plan:   1. Bursitis, hip, right  - dexamethasone (DECADRON) injection 10 mg; Inject 1 mL (10 mg total) into the muscle once.  - predniSONE (DELTASONE) 20 MG tablet; 1 tab 3 x day for 3 days, then 1 tab 2 x day for 3 days, then 1 tab 1 x day for 5 days  Dispense: 20 tablet; Refill: 0 - traMADol (ULTRAM) 50 MG tablet; Take 1 tablet 4 x day if needed for pain  Dispense: 50 tablet; Refill: 0  - Patient was advised f/u with her orthopedist Dr Mayer Camel if pain persisted

## 2014-04-10 NOTE — Patient Instructions (Signed)
Hip Bursitis Bursitis is a swelling and soreness (inflammation) of a fluid-filled sac (bursa). This sac overlies and protects the joints.  CAUSES   Injury.  Overuse of the muscles surrounding the joint.  Arthritis.  Gout.  Infection.  Cold weather.  Inadequate warm-up and conditioning prior to activities. The cause may not be known.  SYMPTOMS   Mild to severe irritation.  Tenderness and swelling over the outside of the hip.  Pain with motion of the hip.  If the bursa becomes infected, a fever may be present. Redness, tenderness, and warmth will develop over the hip. Symptoms usually lessen in 3 to 4 weeks with treatment, but can come back. TREATMENT If conservative treatment does not work, your caregiver may advise draining the bursa and injecting cortisone into the area. This may speed up the healing process. This may also be used as an initial treatment of choice. HOME CARE INSTRUCTIONS   Apply ice to the affected area for 15-20 minutes every 3 to 4 hours while awake for the first 2 days. Put the ice in a plastic bag and place a towel between the bag of ice and your skin.  Rest the painful joint as much as possible, but continue to put the joint through a normal range of motion at least 4 times per day. When the pain lessens, begin normal, slow movements and usual activities to help prevent stiffness of the hip.  Only take over-the-counter or prescription medicines for pain, discomfort, or fever as directed by your caregiver.  Use crutches to limit weight bearing on the hip joint, if advised.  Elevate your painful hip to reduce swelling. Use pillows for propping and cushioning your legs and hips.  Gentle massage may provide comfort and decrease swelling. SEEK IMMEDIATE MEDICAL CARE IF:   Your pain increases even during treatment, or you are not improving.  You have a fever.  You have heat and inflammation over the involved bursa.  You have any other questions or  concerns. MAKE SURE YOU:   Understand these instructions.  Will watch your condition.  Will get help right away if you are not doing well or get worse. Document Released: 10/30/2001 Document Revised: 08/02/2011 Document Reviewed: 05/29/2008 [facility] Patient Information 2015 [place], [place]. This information is not intended to replace advice given to you by your health care provider. Make sure you discuss any questions you have with your health care provider. Hip Bursitis Bursitis is a puffiness (swelling) and soreness of a fluid-filled sac (bursa). This sac covers and protects the joint. HOME CARE  Put ice on the injured area.  Put ice in a plastic bag.  Place a towel between your skin and the bag.  Leave the ice on for 15-20 minutes, 03-04 times a day.  Rest the painful joint as much as possible. Move your joint at least 4 times a day. When pain lessens, start normal, slow movements and normal activities.  Only take medicine as told by your doctor.  Use crutches as told.  Raise (elevate) your painful joint. Use pillows for propping your legs and hips.  Get a massage to lessen pain. GET HELP RIGHT AWAY IF:  Your pain increases or does not improve during treatment.  You have a fever.  You feel heat coming from the affected area.  You see redness and puffiness around the affected area.  You have any questions or concerns. MAKE SURE YOU:  Understand these instructions.  Will watch your condition.  Will get help right  away if you are not well or get worse. Document Released: 06/12/2010 Document Revised: 08/02/2011 Document Reviewed: 06/12/2010 [facility] Patient Information 2015 [place], [place]. This information is not intended to replace advice given to you by your health care provider. Make sure you discuss any questions you have with your health care provider.

## 2014-04-26 ENCOUNTER — Other Ambulatory Visit: Payer: Self-pay | Admitting: Internal Medicine

## 2014-04-26 DIAGNOSIS — Z1231 Encounter for screening mammogram for malignant neoplasm of breast: Secondary | ICD-10-CM

## 2014-05-29 ENCOUNTER — Ambulatory Visit (HOSPITAL_COMMUNITY)
Admission: RE | Admit: 2014-05-29 | Discharge: 2014-05-29 | Disposition: A | Discharge status: Home / Self Care | Payer: 59 | Source: Ambulatory Visit | Attending: Internal Medicine | Admitting: Internal Medicine

## 2014-05-29 DIAGNOSIS — Z1231 Encounter for screening mammogram for malignant neoplasm of breast: Secondary | ICD-10-CM | POA: Insufficient documentation

## 2014-06-18 ENCOUNTER — Encounter: Payer: Self-pay | Admitting: Internal Medicine

## 2014-06-18 ENCOUNTER — Ambulatory Visit (INDEPENDENT_AMBULATORY_CARE_PROVIDER_SITE_OTHER): Payer: 59 | Admitting: Internal Medicine

## 2014-06-18 VITALS — BP 126/86 | HR 76 | Temp 97.7°F | Resp 16 | Ht 67.5 in | Wt 337.0 lb

## 2014-06-18 DIAGNOSIS — R5383 Other fatigue: Secondary | ICD-10-CM

## 2014-06-18 DIAGNOSIS — E785 Hyperlipidemia, unspecified: Secondary | ICD-10-CM

## 2014-06-18 DIAGNOSIS — I10 Essential (primary) hypertension: Secondary | ICD-10-CM

## 2014-06-18 DIAGNOSIS — Z79899 Other long term (current) drug therapy: Secondary | ICD-10-CM

## 2014-06-18 DIAGNOSIS — Z111 Encounter for screening for respiratory tuberculosis: Secondary | ICD-10-CM

## 2014-06-18 DIAGNOSIS — M1000 Idiopathic gout, unspecified site: Secondary | ICD-10-CM

## 2014-06-18 DIAGNOSIS — R7309 Other abnormal glucose: Secondary | ICD-10-CM

## 2014-06-18 DIAGNOSIS — E559 Vitamin D deficiency, unspecified: Secondary | ICD-10-CM

## 2014-06-18 DIAGNOSIS — Z1212 Encounter for screening for malignant neoplasm of rectum: Secondary | ICD-10-CM

## 2014-06-18 LAB — CBC WITH DIFFERENTIAL/PLATELET
BASOS PCT: 0 % (ref 0–1)
Basophils Absolute: 0 10*3/uL (ref 0.0–0.1)
EOS PCT: 2 % (ref 0–5)
Eosinophils Absolute: 0.1 10*3/uL (ref 0.0–0.7)
HEMATOCRIT: 40.1 % (ref 36.0–46.0)
Hemoglobin: 13.1 g/dL (ref 12.0–15.0)
LYMPHS ABS: 2.1 10*3/uL (ref 0.7–4.0)
LYMPHS PCT: 32 % (ref 12–46)
MCH: 28.3 pg (ref 26.0–34.0)
MCHC: 32.7 g/dL (ref 30.0–36.0)
MCV: 86.6 fL (ref 78.0–100.0)
MONO ABS: 0.4 10*3/uL (ref 0.1–1.0)
MONOS PCT: 6 % (ref 3–12)
MPV: 9.9 fL (ref 8.6–12.4)
Neutro Abs: 3.9 10*3/uL (ref 1.7–7.7)
Neutrophils Relative %: 60 % (ref 43–77)
Platelets: 233 10*3/uL (ref 150–400)
RBC: 4.63 MIL/uL (ref 3.87–5.11)
RDW: 13.8 % (ref 11.5–15.5)
WBC: 6.5 10*3/uL (ref 4.0–10.5)

## 2014-06-18 LAB — HEMOGLOBIN A1C
Hgb A1c MFr Bld: 5.6 % (ref <5.7)
Mean Plasma Glucose: 114 mg/dL (ref <117)

## 2014-06-18 NOTE — Progress Notes (Signed)
Patient ID: Alicia Diaz, female   DOB: 1956/04/01, 59 y.o.   MRN: [id number]  Annual Comprehensive Examination  This very nice 59 y.o. DBF presents for complete physical.  Patient has been followed for HTN,  Morbid Obesity,   Prediabetes, Hyperlipidemia and Vitamin D Deficiency.    HTN predates since  55. Patient's BP has been controlled at home and patient denies any cardiac symptoms as chest pain, palpitations, shortness of breath, dizziness or ankle swelling. Today's BP: 126/86 mmHg    Patient's hyperlipidemia is controlled with diet and medications. Patient denies myalgias or other medication SE's. Last lipids were at goal - Total Chol 164; HDL  58; LDL 89; Trig 85 on 03/07/2014.   Patient has Morbid Obesity (BMI 51.97) and consequent  prediabetes predating since Sept 2011 with A1c 5.9%. Patient has considered gastric bipass, but her Ins Co would not approve it. Patient denies reactive hypoglycemic symptoms, visual blurring, diabetic polys, or paresthesias. Last A1c was 5.6% on 06/18/2014.   Finally, patient has history of Vitamin D Deficiency of 18 in 2008 & she alleges being unable to take even small doses of Vit D due to muscular cramping and last Vitamin D was 17 on 03/07/2014.  Medication Sig  . aspirin 81 MG tablet Take 81 mg by mouth daily.  . bumetanide (BUMEX) 1 MG tablet Take 1 tablet (1 mg total) by mouth as needed.  . diclofenac sodium (VOLTAREN) 1 % GEL Apply 4 g topically 4 (four) times daily.  Marland Kitchen doxazosin (CARDURA) 4 MG tablet Take 4 mg by mouth at bedtime.  . naproxen sodium (ANAPROX) 220 MG tablet Take 220 mg by mouth daily as needed.  . traMADol (ULTRAM) 50 MG tablet Take 1 tablet 4 x day if needed for pain  . allopurinol (ZYLOPRIM) 300 MG tablet Take 1 tablet (300 mg total) by mouth daily.  Marland Kitchen losartan (COZAAR) 100 MG tablet Take 100 mg by mouth daily.   Allergies  Allergen Reactions  . Ace Inhibitors     Cough  . Paxil [Paroxetine Hcl]     Mood swings  .  Prednisone     Nausea/ irratated  . Vitamin D Analogs     Cramping   Past Medical History  Diagnosis Date  . Obesity   . Anemia   . Hyperlipidemia   . Hypertension   . Sickle cell trait   . Elevated hemoglobin A1c   . DJD (degenerative joint disease)   . DDD (degenerative disc disease)   . GERD (gastroesophageal reflux disease)    Health Maintenance  Topic Date Due  . PAP SMEAR  06/08/1972  . TETANUS/TDAP  06/08/1973  . COLONOSCOPY  06/08/2004  . INFLUENZA VACCINE  12/22/2013  . ZOSTAVAX  06/08/2014  . MAMMOGRAM  05/29/2016   Immunization History  Administered Date(s) Administered  . PPD Test 05/02/2013, 06/18/2014   Past Surgical History  Procedure Laterality Date  . Shoulder surgery Left 2007   Family History  Problem Relation Age of Onset  . Heart disease Mother   . Cancer Father     prostate  . Multiple myeloma Father   . Diabetes Sister   . Mental illness Sister   . Diabetes Brother   . Obstructive Sleep Apnea Brother    History  Substance Use Topics  . Smoking status: Never Smoker   . Smokeless tobacco: Not on file  . Alcohol Use: 3.0 oz/week    6 drink(s) per week    ROS Constitutional: Denies fever, chills,  weight loss/gain, headaches, insomnia, fatigue, night sweats, and change in appetite. Eyes: Denies redness, blurred vision, diplopia, discharge, itchy, watery eyes.  ENT: Denies discharge, congestion, post nasal drip, epistaxis, sore throat, earache, hearing loss, dental pain, Tinnitus, Vertigo, Sinus pain, snoring.  Cardio: Denies chest pain, palpitations, irregular heartbeat, syncope, dyspnea, diaphoresis, orthopnea, PND, claudication, edema Respiratory: denies cough, dyspnea, DOE, pleurisy, hoarseness, laryngitis, wheezing.  Gastrointestinal: Denies dysphagia, heartburn, reflux, water brash, pain, cramps, nausea, vomiting, bloating, diarrhea, constipation, hematemesis, melena, hematochezia, jaundice, hemorrhoids Genitourinary: Denies dysuria,  frequency, urgency, nocturia, hesitancy, discharge, hematuria, flank pain Breast: Breast lumps, nipple discharge, bleeding.  Musculoskeletal: Denies arthralgia, myalgia, stiffness, Jt. Swelling, pain, limp, and strain/sprain. Denies falls. Skin: Denies puritis, rash, hives, warts, acne, eczema, changing in skin lesion Neuro: No weakness, tremor, incoordination, spasms, paresthesia, pain Psychiatric: Denies confusion, memory loss, sensory loss. Denies Depression. Endocrine: Denies change in weight, skin, hair change, nocturia, and paresthesia, diabetic polys, visual blurring, hyper / hypo glycemic episodes.  Heme/Lymph: No excessive bleeding, bruising, enlarged lymph nodes.  Physical Exam  BP 126/86   Pulse 76  Temp 97.7 F   Resp 16  Ht 5' 7.5"   Wt 337 lb     BMI 51.97   General Appearance: Well nourished and in no apparent distress. Eyes: PERRLA, EOMs, conjunctiva no swelling or erythema, normal fundi and vessels. Sinuses: No frontal/maxillary tenderness ENT/Mouth: EACs patent / TMs  nl. Nares clear without erythema, swelling, mucoid exudates. Oral hygiene is good. No erythema, swelling, or exudate. Tongue normal, non-obstructing. Tonsils not swollen or erythematous. Hearing normal.  Neck: Supple, thyroid normal. No bruits, nodes or JVD. Respiratory: Respiratory effort normal.  BS equal and clear bilateral without rales, rhonci, wheezing or stridor. Cardio: Heart sounds are normal with regular rate and rhythm and no murmurs, rubs or gallops. Peripheral pulses are normal and equal bilaterally without edema. No aortic or femoral bruits. Chest: symmetric with normal excursions and percussion. Breasts: Deferred to Dr Ulanda Edison [facility])  Abdomen: Flat, soft, with bowl sounds. Nontender, no guarding, rebound, hernias, masses, or organomegaly.  Lymphatics: Non tender without lymphadenopathy.  Genitourinary: Deferred to Dr Ulanda Edison [facility]). Musculoskeletal: Full ROM all peripheral extremities, joint  stability, 5/5 strength, and normal gait. Skin: Warm and dry without rashes, lesions, cyanosis, clubbing or  ecchymosis.  Neuro: Cranial nerves intact, reflexes equal bilaterally. Normal muscle tone, no cerebellar symptoms. Sensation intact.  Pysch: Awake and oriented X 3, normal affect, Insight and Judgment appropriate.   Assessment and Plan  1. Essential hypertension  - Microalbumin / creatinine urine ratio - EKG 12-Lead - [place], RETROPERITNL ABD,  LTD  2. Hyperlipidemia  - Lipid panel  3.  Morbid Obesity & PreDiabetes  - Hemoglobin A1c - Insulin, fasting  4. Vitamin D deficiency  - Vit D  25 hydroxy (rtn osteoporosis monitoring)  5. Screening for rectal cancer  - POC Hemoccult Bld/Stl (3-Cd Home Screen); Future  6. Idiopathic gout, unspecified chronicity, unspecified site  - Uric acid  7. Encounter for long-term (current) use of medications  - Urine Microscopic - CBC with Differential/Platelet - BASIC METABOLIC PANEL WITH GFR - Hepatic function panel - Magnesium  8. Other fatigue  - Vitamin B12 - Iron and TIBC - TSH  9. Screening examination for pulmonary tuberculosis  - PPD  Continue prudent diet as discussed, weight control, BP monitoring, regular exercise, and medications. Discussed med's effects and SE's. Screening labs and tests as requested with regular follow-up as recommended.

## 2014-06-18 NOTE — Patient Instructions (Signed)
Recommend the book "The END of DIETING" by Dr Excell Seltzer   & the book "The END of DIABETES " by Dr Excell Seltzer  At [email or web address] - get book & Audio CD's      Being diabetic has a  300% increased risk for heart attack, stroke, cancer, and alzheimer- type vascular dementia. It is very important that you work harder with diet by avoiding all foods that are white except chicken & fish. Avoid white rice (brown & wild rice is OK), white potatoes (sweetpotatoes in moderation is OK), White bread or wheat bread or anything made out of white flour like bagels, donuts, rolls, buns, biscuits, cakes, pastries, cookies, pizza crust, and pasta (made from white flour & egg whites) - vegetarian pasta or spinach or wheat pasta is OK. Multigrain breads like Arnold's or [employer], or multigrain sandwich thins or flatbreads.  Diet, exercise and weight loss can reverse and cure diabetes in the early stages.  Diet, exercise and weight loss is very important in the control and prevention of complications of diabetes which affects every system in your body, ie. Brain - dementia/stroke, eyes - glaucoma/blindness, heart - heart attack/heart failure, kidneys - dialysis, stomach - gastric paralysis, intestines - malabsorption, nerves - severe painful neuritis, circulation - gangrene & loss of a leg(s), and finally cancer and Alzheimers.    I recommend avoid fried & greasy foods,  sweets/candy, white rice (brown or wild rice or Quinoa is OK), white potatoes (sweet potatoes are OK) - anything made from white flour - bagels, doughnuts, rolls, buns, biscuits,white and wheat breads, pizza crust and traditional pasta made of white flour & egg white(vegetarian pasta or spinach or wheat pasta is OK).  Multi-grain bread is OK - like multi-grain flat bread or sandwich thins. Avoid alcohol in excess. Exercise is also important.    Eat all the vegetables you want - avoid meat, especially red meat and dairy - especially cheese.  Cheese  is the most concentrated form of trans-fats which is the worst thing to clog up our arteries. Veggie cheese is OK which can be found in the fresh produce section at [employer] or [employer] or [employer]  Preventive Care for Adults A healthy lifestyle and preventive care can promote health and wellness. Preventive health guidelines for women include the following key practices.  A routine yearly physical is a good way to check with your health care provider about your health and preventive screening. It is a chance to share any concerns and updates on your health and to receive a thorough exam.  Visit your dentist for a routine exam and preventive care every 6 months. Brush your teeth twice a day and floss once a day. Good oral hygiene prevents tooth decay and gum disease.  The frequency of eye exams is based on your age, health, family medical history, use of contact lenses, and other factors. Follow your health care provider's recommendations for frequency of eye exams.  Eat a healthy diet. Foods like vegetables, fruits, whole grains, low-fat dairy products, and lean protein foods contain the nutrients you need without too many calories. Decrease your intake of foods high in solid fats, added sugars, and salt. Eat the right amount of calories for you.Get information about a proper diet from your health care provider, if necessary.  Regular physical exercise is one of the most important things you can do for your health. Most adults should get at least 150 minutes of moderate-intensity exercise (any activity that increases  your heart rate and causes you to sweat) each week. In addition, most adults need muscle-strengthening exercises on 2 or more days a week.  Maintain a healthy weight. The body mass index (BMI) is a screening tool to identify possible weight problems. It provides an estimate of body fat based on height and weight. Your health care provider can find your BMI and can help you  achieve or maintain a healthy weight.For adults 20 years and older:  A BMI below 18.5 is considered underweight.  A BMI of 18.5 to 24.9 is normal.  A BMI of 25 to 29.9 is considered overweight.  A BMI of 30 and above is considered obese.  Maintain normal blood lipids and cholesterol levels by exercising and minimizing your intake of saturated fat. Eat a balanced diet with plenty of fruit and vegetables. Blood tests for lipids and cholesterol should begin at age 38 and be repeated every 5 years. If your lipid or cholesterol levels are high, you are over 50, or you are at high risk for heart disease, you may need your cholesterol levels checked more frequently.Ongoing high lipid and cholesterol levels should be treated with medicines if diet and exercise are not working.  If you smoke, find out from your health care provider how to quit. If you do not use tobacco, do not start.  Lung cancer screening is recommended for adults aged 64-80 years who are at high risk for developing lung cancer because of a history of smoking. A yearly low-dose CT scan of the lungs is recommended for people who have at least a 30-pack-year history of smoking and are a current smoker or have quit within the past 15 years. A pack year of smoking is smoking an average of 1 pack of cigarettes a day for 1 year (for example: 1 pack a day for 30 years or 2 packs a day for 15 years). Yearly screening should continue until the smoker has stopped smoking for at least 15 years. Yearly screening should be stopped for people who develop a health problem that would prevent them from having lung cancer treatment.  High blood pressure causes heart disease and increases the risk of stroke. Your blood pressure should be checked at least every 1 to 2 years. Ongoing high blood pressure should be treated with medicines if weight loss and exercise do not work.  If you are 79-57 years old, ask your health care provider if you should take  aspirin to prevent strokes.  Diabetes screening involves taking a blood sample to check your fasting blood sugar level. This should be done once every 3 years, after age 37, if you are within normal weight and without risk factors for diabetes. Testing should be considered at a younger age or be carried out more frequently if you are overweight and have at least 1 risk factor for diabetes.  Breast cancer screening is essential preventive care for women. You should practice "breast self-awareness." This means understanding the normal appearance and feel of your breasts and may include breast self-examination. Any changes detected, no matter how small, should be reported to a health care provider. Women in their 76s and 30s should have a clinical breast exam (CBE) by a health care provider as part of a regular health exam every 1 to 3 years. After age 55, women should have a CBE every year. Starting at age 79, women should consider having a mammogram (breast X-ray test) every year. Women who have a family history of breast  cancer should talk to their health care provider about genetic screening. Women at a high risk of breast cancer should talk to their health care providers about having an MRI and a mammogram every year.  Breast cancer gene (BRCA)-related cancer risk assessment is recommended for women who have family members with BRCA-related cancers. BRCA-related cancers include breast, ovarian, tubal, and peritoneal cancers. Having family members with these cancers may be associated with an increased risk for harmful changes (mutations) in the breast cancer genes BRCA1 and BRCA2. Results of the assessment will determine the need for genetic counseling and BRCA1 and BRCA2 testing.  Routine pelvic exams to screen for cancer are no longer recommended for nonpregnant women who are considered low risk for cancer of the pelvic organs (ovaries, uterus, and vagina) and who do not have symptoms. Ask your health  care provider if a screening pelvic exam is right for you.  If you have had past treatment for cervical cancer or a condition that could lead to cancer, you need Pap tests and screening for cancer for at least 20 years after your treatment. If Pap tests have been discontinued, your risk factors (such as having a new sexual partner) need to be reassessed to determine if screening should be resumed. Some women have medical problems that increase the chance of getting cervical cancer. In these cases, your health care provider may recommend more frequent screening and Pap tests.  Colorectal cancer can be detected and often prevented. Most routine colorectal cancer screening begins at the age of 58 years and continues through age 44 years. However, your health care provider may recommend screening at an earlier age if you have risk factors for colon cancer. On a yearly basis, your health care provider may provide home test kits to check for hidden blood in the stool. Use of a small camera at the end of a tube, to directly examine the colon (sigmoidoscopy or colonoscopy), can detect the earliest forms of colorectal cancer. Talk to your health care provider about this at age 84, when routine screening begins. Direct exam of the colon should be repeated every 5-10 years through age 33 years, unless early forms of pre-cancerous polyps or small growths are found.  Hepatitis C blood testing is recommended for all people born from [birth year] through 1965 and any individual with known risks for hepatitis C.  Pra  Osteoporosis is a disease in which the bones lose minerals and strength with aging. This can result in serious bone fractures or breaks. The risk of osteoporosis can be identified using a bone density scan. Women ages 36 years and over and women at risk for fractures or osteoporosis should discuss screening with their health care providers. Ask your health care provider whether you should take a calcium supplement  or vitamin D to reduce the rate of osteoporosis.  Menopause can be associated with physical symptoms and risks. Hormone replacement therapy is available to decrease symptoms and risks. You should talk to your health care provider about whether hormone replacement therapy is right for you.  Use sunscreen. Apply sunscreen liberally and repeatedly throughout the day. You should seek shade when your shadow is shorter than you. Protect yourself by wearing long sleeves, pants, a wide-brimmed hat, and sunglasses year round, whenever you are outdoors.  Once a month, do a whole body skin exam, using a mirror to look at the skin on your back. Tell your health care provider of new moles, moles that have irregular borders, moles that are  larger than a pencil eraser, or moles that have changed in shape or color.  Stay current with required vaccines (immunizations).  Influenza vaccine. All adults should be immunized every year.  Tetanus, diphtheria, and acellular pertussis (Td, Tdap) vaccine. Pregnant women should receive 1 dose of Tdap vaccine during each pregnancy. The dose should be obtained regardless of the length of time since the last dose. Immunization is preferred during the 27th-36th week of gestation. An adult who has not previously received Tdap or who does not know her vaccine status should receive 1 dose of Tdap. This initial dose should be followed by tetanus and diphtheria toxoids (Td) booster doses every 10 years. Adults with an unknown or incomplete history of completing a 3-dose immunization series with Td-containing vaccines should begin or complete a primary immunization series including a Tdap dose. Adults should receive a Td booster every 10 years.  Varicella vaccine. An adult without evidence of immunity to varicella should receive 2 doses or a second dose if she has previously received 1 dose. Pregnant females who do not have evidence of immunity should receive the first dose after  pregnancy. This first dose should be obtained before leaving the health care facility. The second dose should be obtained 4-8 weeks after the first dose.  Human papillomavirus (HPV) vaccine. Females aged 13-26 years who have not received the vaccine previously should obtain the 3-dose series. The vaccine is not recommended for use in pregnant females. However, pregnancy testing is not needed before receiving a dose. If a female is found to be pregnant after receiving a dose, no treatment is needed. In that case, the remaining doses should be delayed until after the pregnancy. Immunization is recommended for any person with an immunocompromised condition through the age of 26 years if she did not get any or all doses earlier. During the 3-dose series, the second dose should be obtained 4-8 weeks after the first dose. The third dose should be obtained 24 weeks after the first dose and 16 weeks after the second dose.  Zoster vaccine. One dose is recommended for adults aged 60 years or older unless certain conditions are present.  Measles, mumps, and rubella (MMR) vaccine. Adults born before 1957 generally are considered immune to measles and mumps. Adults born in 1957 or later should have 1 or more doses of MMR vaccine unless there is a contraindication to the vaccine or there is laboratory evidence of immunity to each of the three diseases. A routine second dose of MMR vaccine should be obtained at least 28 days after the first dose for students attending postsecondary schools, health care workers, or international travelers. People who received inactivated measles vaccine or an unknown type of measles vaccine during 1963-1967 should receive 2 doses of MMR vaccine. People who received inactivated mumps vaccine or an unknown type of mumps vaccine before 1979 and are at high risk for mumps infection should consider immunization with 2 doses of MMR vaccine. For females of childbearing age, rubella immunity should  be determined. If there is no evidence of immunity, females who are not pregnant should be vaccinated. If there is no evidence of immunity, females who are pregnant should delay immunization until after pregnancy. Unvaccinated health care workers born before 1957 who lack laboratory evidence of measles, mumps, or rubella immunity or laboratory confirmation of disease should consider measles and mumps immunization with 2 doses of MMR vaccine or rubella immunization with 1 dose of MMR vaccine.  Pneumococcal 13-valent conjugate (PCV13) vaccine. When   indicated, a person who is uncertain of her immunization history and has no record of immunization should receive the PCV13 vaccine. An adult aged 73 years or older who has certain medical conditions and has not been previously immunized should receive 1 dose of PCV13 vaccine. This PCV13 should be followed with a dose of pneumococcal polysaccharide (PPSV23) vaccine. The PPSV23 vaccine dose should be obtained at least 8 weeks after the dose of PCV13 vaccine. An adult aged 81 years or older who has certain medical conditions and previously received 1 or more doses of PPSV23 vaccine should receive 1 dose of PCV13. The PCV13 vaccine dose should be obtained 1 or more years after the last PPSV23 vaccine dose.    Pneumococcal polysaccharide (PPSV23) vaccine. When PCV13 is also indicated, PCV13 should be obtained first. All adults aged 69 years and older should be immunized. An adult younger than age 35 years who has certain medical conditions should be immunized. Any person who resides in a nursing home or long-term care facility should be immunized. An adult smoker should be immunized. People with an immunocompromised condition and certain other conditions should receive both PCV13 and PPSV23 vaccines. People with human immunodeficiency virus (HIV) infection should be immunized as soon as possible after diagnosis. Immunization during chemotherapy or radiation therapy should  be avoided. Routine use of PPSV23 vaccine is not recommended for American Indians, [place]atives, or people younger than 65 years unless there are medical conditions that require PPSV23 vaccine. When indicated, people who have unknown immunization and have no record of immunization should receive PPSV23 vaccine. One-time revaccination 5 years after the first dose of PPSV23 is recommended for people aged 19-64 years who have chronic kidney failure, nephrotic syndrome, asplenia, or immunocompromised conditions. People who received 1-2 doses of PPSV23 before age 79 years should receive another dose of PPSV23 vaccine at age 73 years or later if at least 5 years have passed since the previous dose. Doses of PPSV23 are not needed for people immunized with PPSV23 at or after age 19 years.  Preventive Services / Frequency   Ages 25 to 65 years  Blood pressure check.  Lipid and cholesterol check.  Lung cancer screening. / Every year if you are aged 8-80 years and have a 30-pack-year history of smoking and currently smoke or have quit within the past 15 years. Yearly screening is stopped once you have quit smoking for at least 15 years or develop a health problem that would prevent you from having lung cancer treatment.  Clinical breast exam.** / Every year after age 28 years.  BRCA-related cancer risk assessment.** / For women who have family members with a BRCA-related cancer (breast, ovarian, tubal, or peritoneal cancers).  Mammogram.** / Every year beginning at age 57 years and continuing for as long as you are in good health. Consult with your health care provider.  Pap test.** / Every 3 years starting at age 57 years through age 25 or 13 years with a history of 3 consecutive normal Pap tests.  HPV screening.** / Every 3 years from ages 21 years through ages 60 to 48 years with a history of 3 consecutive normal Pap tests.  Fecal occult blood test (FOBT) of stool. / Every year beginning at age 77  years and continuing until age 57 years. You may not need to do this test if you get a colonoscopy every 10 years.  Flexible sigmoidoscopy or colonoscopy.** / Every 5 years for a flexible sigmoidoscopy or every 10 years  for a colonoscopy beginning at age 104 years and continuing until age 39 years.  Hepatitis C blood test.** / For all people born from [birth year] through 1966 and any individual with known risks for hepatitis C.  Skin self-exam. / Monthly.  Influenza vaccine. / Every year.  Tetanus, diphtheria, and acellular pertussis (Tdap/Td) vaccine.** / Consult your health care provider. Pregnant women should receive 1 dose of Tdap vaccine during each pregnancy. 1 dose of Td every 10 years.  Varicella vaccine.** / Consult your health care provider. Pregnant females who do not have evidence of immunity should receive the first dose after pregnancy.  Zoster vaccine.** / 1 dose for adults aged 47 years or older.  Pneumococcal 13-valent conjugate (PCV13) vaccine.** / Consult your health care provider.  Pneumococcal polysaccharide (PPSV23) vaccine.** / 1 to 2 doses if you smoke cigarettes or if you have certain conditions.  Meningococcal vaccine.** / Consult your health care provider.  Hepatitis A vaccine.** / Consult your health care provider.  Hepatitis B vaccine.** / Consult your health care provider. Screening for abdominal aortic aneurysm (AAA)  by ultrasound is recommended for people over 50 who have history of high blood pressure or who are current or former smokers.

## 2014-06-19 LAB — LIPID PANEL
CHOLESTEROL: 173 mg/dL (ref 0–200)
HDL: 61 mg/dL (ref >39)
LDL Cholesterol: 94 mg/dL (ref 0–99)
Total CHOL/HDL Ratio: 2.8 Ratio
Triglycerides: 92 mg/dL (ref <150)
VLDL: 18 mg/dL (ref 0–40)

## 2014-06-19 LAB — BASIC METABOLIC PANEL WITH GFR
BUN: 14 mg/dL (ref 6–23)
CO2: 25 meq/L (ref 19–32)
CREATININE: 0.64 mg/dL (ref 0.50–1.10)
Calcium: 9.1 mg/dL (ref 8.4–10.5)
Chloride: 101 mEq/L (ref 96–112)
GFR, Est African American: >89 mL/min
GLUCOSE: 101 mg/dL — AB (ref 70–99)
POTASSIUM: 4.6 meq/L (ref 3.5–5.3)
Sodium: 139 mEq/L (ref 135–145)

## 2014-06-19 LAB — IRON AND TIBC
%SAT: 12 % — ABNORMAL LOW (ref 20–55)
IRON: 44 ug/dL (ref 42–145)
TIBC: 373 ug/dL (ref 250–470)
UIBC: 329 ug/dL (ref 125–400)

## 2014-06-19 LAB — HEPATIC FUNCTION PANEL
ALK PHOS: 119 U/L — AB (ref 39–117)
ALT: 17 U/L (ref 0–35)
AST: 18 U/L (ref 0–37)
Albumin: 4 g/dL (ref 3.5–5.2)
BILIRUBIN DIRECT: 0.2 mg/dL (ref 0.0–0.3)
BILIRUBIN INDIRECT: 0.6 mg/dL (ref 0.2–1.2)
TOTAL PROTEIN: 7.2 g/dL (ref 6.0–8.3)
Total Bilirubin: 0.8 mg/dL (ref 0.2–1.2)

## 2014-06-19 LAB — URINALYSIS, MICROSCOPIC ONLY
CASTS: NONE SEEN
CRYSTALS: NONE SEEN

## 2014-06-19 LAB — MICROALBUMIN / CREATININE URINE RATIO
Creatinine, Urine: 112.9 mg/dL
Microalb Creat Ratio: 14.2 mg/g (ref 0.0–30.0)
Microalb, Ur: 1.6 mg/dL (ref <2.0)

## 2014-06-19 LAB — VITAMIN B12: VITAMIN B 12: 347 pg/mL (ref 211–911)

## 2014-06-19 LAB — MAGNESIUM: Magnesium: 1.9 mg/dL (ref 1.5–2.5)

## 2014-06-19 LAB — TSH: TSH: 2.437 u[IU]/mL (ref 0.350–4.500)

## 2014-06-19 LAB — INSULIN, FASTING: INSULIN FASTING, SERUM: 18.3 u[IU]/mL (ref 2.0–19.6)

## 2014-06-19 LAB — URIC ACID: Uric Acid, Serum: 7 mg/dL (ref 2.4–7.0)

## 2014-06-19 LAB — VITAMIN D 25 HYDROXY (VIT D DEFICIENCY, FRACTURES): VIT D 25 HYDROXY: 15 ng/mL — AB (ref 30–100)

## 2014-06-20 LAB — TB SKIN TEST
INDURATION: 0 mm
TB Skin Test: NEGATIVE

## 2014-06-25 ENCOUNTER — Telehealth: Payer: Self-pay | Admitting: *Deleted

## 2014-06-25 NOTE — Telephone Encounter (Signed)
Pt aware of lab results 

## 2014-07-03 ENCOUNTER — Other Ambulatory Visit: Payer: Self-pay | Admitting: Orthopedic Surgery

## 2014-07-03 DIAGNOSIS — M545 Low back pain: Secondary | ICD-10-CM

## 2014-07-11 ENCOUNTER — Other Ambulatory Visit: Payer: Self-pay | Admitting: *Deleted

## 2014-07-11 DIAGNOSIS — Z1212 Encounter for screening for malignant neoplasm of rectum: Secondary | ICD-10-CM

## 2014-07-11 LAB — POC HEMOCCULT BLD/STL (HOME/3-CARD/SCREEN)
Card #3 Fecal Occult Blood, POC: NEGATIVE
FECAL OCCULT BLD: NEGATIVE
Fecal Occult Blood, POC: NEGATIVE

## 2014-07-14 ENCOUNTER — Ambulatory Visit
Admission: RE | Admit: 2014-07-14 | Discharge: 2014-07-14 | Disposition: A | Discharge status: Home / Self Care | Payer: 59 | Source: Ambulatory Visit | Attending: Orthopedic Surgery | Admitting: Orthopedic Surgery

## 2014-07-14 DIAGNOSIS — M545 Low back pain: Secondary | ICD-10-CM

## 2014-08-05 ENCOUNTER — Other Ambulatory Visit: Payer: Self-pay | Admitting: *Deleted

## 2014-08-05 ENCOUNTER — Other Ambulatory Visit: Payer: Self-pay | Admitting: Emergency Medicine

## 2014-08-05 MED ORDER — ALLOPURINOL 300 MG PO TABS: 300.0000 mg | ORAL_TABLET | Freq: Every day | ORAL | Status: DC

## 2014-10-02 ENCOUNTER — Ambulatory Visit: Payer: Self-pay | Admitting: Physician Assistant

## 2014-10-02 ENCOUNTER — Encounter: Payer: Self-pay | Admitting: Internal Medicine

## 2014-10-02 ENCOUNTER — Ambulatory Visit (INDEPENDENT_AMBULATORY_CARE_PROVIDER_SITE_OTHER): Payer: 59 | Admitting: Internal Medicine

## 2014-10-02 VITALS — BP 122/68 | HR 68 | Temp 97.7°F | Resp 16 | Ht 67.5 in | Wt 342.0 lb

## 2014-10-02 DIAGNOSIS — R7309 Other abnormal glucose: Secondary | ICD-10-CM

## 2014-10-02 DIAGNOSIS — I10 Essential (primary) hypertension: Secondary | ICD-10-CM

## 2014-10-02 DIAGNOSIS — Z79899 Other long term (current) drug therapy: Secondary | ICD-10-CM

## 2014-10-02 DIAGNOSIS — E559 Vitamin D deficiency, unspecified: Secondary | ICD-10-CM

## 2014-10-02 DIAGNOSIS — E785 Hyperlipidemia, unspecified: Secondary | ICD-10-CM

## 2014-10-02 NOTE — Patient Instructions (Signed)
Ways to cut 100 calories  1. Eat your eggs with hot sauce OR salsa instead of cheese.  Eggs are great for breakfast, but many people consider eggs and cheese to be BFFs. Instead of cheese-1 oz. of cheddar has 114 calories-top your eggs with hot sauce, which contains no calories and helps with satiety and metabolism. Salsa is also a great option!!  2. Top your toast, waffles or pancakes with mashed berries instead of jelly or syrup. Half a cup of berries-fresh, frozen or thawed-has about 40 calories, compared with 2 tbsp. of maple syrup or jelly, which both have about 100 calories. The berries will also give you a good punch of fiber, which helps keep you full and satisfied and won't spike blood sugar quickly like the jelly or syrup. 3. Swap the non-fat latte for black coffee with a splash of half-and-half. Contrary to its name, that non-fat latte has 130 calories and a startling 19g of carbohydrates per 16 oz. serving. Replacing that 'light' drinkable dessert with a black coffee with a splash of half-and-half saves you more than 100 calories per 16 oz. serving. 4. Sprinkle salads with freeze-dried raspberries instead of dried cranberries. If you want a sweet addition to your nutritious salad, stay away from dried cranberries. They have a whopping 130 calories per  cup and 30g carbohydrates. Instead, sprinkle freeze-dried raspberries guilt-free and save more than 100 calories per  cup serving, adding 3g of belly-filling fiber. 5. Go for mustard in place of mayo on your sandwich. Mustard can add really nice flavor to any sandwich, and there are tons of varieties, from spicy to honey. A serving of mayo is 95 calories, versus 10 calories in a serving of mustard. 6. Choose a DIY salad dressing instead of the store-bought kind. Mix Dijon or whole grain mustard with low-fat Kefir or red wine vinegar and garlic. 7. Use hummus as a spread instead of a dip. Use hummus as a spread on a high-fiber cracker or  tortilla with a sandwich and save on calories without sacrificing taste. 8. Pick just one salad "accessory." Salad isn't automatically a calorie winner. It's easy to over-accessorize with toppings. Instead of topping your salad with nuts, avocado and cranberries (all three will clock in at 313 calories), just pick one. The next day, choose a different accessory, which will also keep your salad interesting. You don't wear all your jewelry every day, right? 9. Ditch the white pasta in favor of spaghetti squash. One cup of cooked spaghetti squash has about 40 calories, compared with traditional spaghetti, which comes with more than 200. Spaghetti squash is also nutrient-dense. It's a good source of fiber and Vitamins A and C, and it can be eaten just like you would eat pasta-with a great tomato sauce and [place] meatballs or with pesto, tofu and spinach, for example. 10. Dress up your chili, soups and stews with non-fat [place] yogurt instead of sour cream. Just a 'dollop' of sour cream can set you back 115 calories and a whopping 12g of fat-seven of which are of the artery-clogging variety. Added bonus: [place] yogurt is packed with muscle-building protein, calcium and B Vitamins. 11. Mash cauliflower instead of mashed potatoes. One cup of traditional mashed potatoes-in all their creamy goodness-has more than 200 calories, compared to mashed cauliflower, which you can typically eat for less than 100 calories per 1 cup serving. Cauliflower is a great source of the antioxidant indole-3-carbinol (I3C), which may help reduce the risk of some cancers, like breast  cancer. 12. Ditch the ice cream sundae in favor of a Greek yogurt parfait. Instead of a cup of ice cream or fro-yo for dessert, try 1 cup of nonfat Greek yogurt topped with fresh berries and a sprinkle of cacao nibs. Both toppings are packed with antioxidants, which can help reduce cellular inflammation and oxidative damage. And the comparison is a  no-brainer: One cup of ice cream has about 275 calories; one cup of frozen yogurt has about 230; and a cup of Greek yogurt has just 130, plus twice the protein, so you're less likely to return to the freezer for a second helping. 13. Put olive oil in a spray container instead of using it directly from the bottle. Each tablespoon of olive oil is 120 calories and 15g of fat. Use a mister instead of pouring it straight into the pan or onto a salad. This allows for portion control and will save you more than 100 calories. 14. When baking, substitute canned pumpkin for butter or oil. Canned pumpkin-not pumpkin pie mix-is loaded with Vitamin A, which is important for skin and eye health, as well as immunity. And the comparisons are pretty crazy:  cup of canned pumpkin has about 40 calories, compared to butter or oil, which has more than 800 calories. Yes, 800 calories. Applesauce and mashed banana can also serve as good substitutions for butter or oil, usually in a 1:1 ratio. 15. Top casseroles with high-fiber cereal instead of breadcrumbs. Breadcrumbs are typically made with white bread, while breakfast cereals contain 5-9g of fiber per serving. Not only will you save more than 150 calories per  cup serving, the swap will also keep you more full and you'll get a metabolism boost from the added fiber. 16. Snack on pistachios instead of macadamia nuts. Believe it or not, you get the same amount of calories from 35 pistachios (100 calories) as you would from only five macadamia nuts. 17. Chow down on kale chips rather than potato chips. This is my favorite 'don't knock it 'till you try it' swap. Kale chips are so easy to make at home, and you can spice them up with a little grated parmesan or chili powder. Plus, they're a mere fraction of the calories of potato chips, but with the same crunch factor we crave so often. 18. Add seltzer and some fruit slices to your cocktail instead of soda or fruit juice. One  cup of soda or fruit juice can pack on as much as 140 calories. Instead, use seltzer and fruit slices. The fruit provides valuable phytochemicals, such as flavonoids and anthocyanins, which help to combat cancer and stave off the aging process.  

## 2014-10-02 NOTE — Progress Notes (Signed)
Patient ID: Alicia Diaz, female   DOB: 04-13-1956, 59 y.o.   MRN: [id number]  Assessment and Plan:  Hypertension:  -Continue medication,  -monitor blood pressure at home.  -Continue DASH diet.   -Reminder to go to the ER if any CP, SOB, nausea, dizziness, severe HA, changes vision/speech, left arm numbness and tingling, and jaw pain.  Cholesterol: -Continue diet and exercise.  -Check cholesterol.   Pre-diabetes: -Continue diet and exercise.  -Check A1C  Vitamin D Def: -check level -continue medications.   Morbid Obesity -bariatric surgery declined due to cost -recommended water aerobics for weight loss -discussed diet with recommendations.  -if back on diet and doing exercise and weight loss seen will prescribe weight loss medications at next visit -consider sending to nutritionist.  Continue diet and meds as discussed. Further disposition pending results of labs.  HPI 59 y.o. female  presents for 3 month follow up with hypertension, hyperlipidemia, prediabetes and vitamin D.   Her blood pressure has been controlled at home, today their BP is BP: 122/68 mmHg.   She does not workout.  She reports that the pain in her back and the pain in her knees don't allow her to do any back exercises. She denies chest pain, shortness of breath, dizziness.   She is not on cholesterol medication and denies myalgias. Her cholesterol is at goal. The cholesterol last visit was:   Lab Results  Component Value Date   CHOL 173 06/18/2014   HDL 61 06/18/2014   LDLCALC 94 06/18/2014   TRIG 92 06/18/2014   CHOLHDL 2.8 06/18/2014     She has been working on diet and exercise for prediabetes, and denies foot ulcerations, increased appetite, nausea, paresthesia of the feet, polydipsia, polyuria, visual disturbances and vomiting. Last A1C in the office was:  Lab Results  Component Value Date   HGBA1C 5.6 06/18/2014    Patient is on Vitamin D supplement.  Lab Results  Component Value Date   VD25OH 15* 06/18/2014     Current Medications:  Current Outpatient Prescriptions on File Prior to Visit  Medication Sig Dispense Refill  . allopurinol (ZYLOPRIM) 300 MG tablet Take 1 tablet (300 mg total) by mouth daily. 90 tablet 2  . aspirin 81 MG tablet Take 81 mg by mouth daily.    . bumetanide (BUMEX) 1 MG tablet Take 1 tablet (1 mg total) by mouth as needed. 30 tablet 4  . diclofenac sodium (VOLTAREN) 1 % GEL Apply 4 g topically 4 (four) times daily. 100 g 3  . doxazosin (CARDURA) 4 MG tablet Take 4 mg by mouth at bedtime.    Marland Kitchen losartan (COZAAR) 100 MG tablet Take 100 mg by mouth daily.    . naproxen sodium (ANAPROX) 220 MG tablet Take 220 mg by mouth daily as needed.    . traMADol (ULTRAM) 50 MG tablet Take 1 tablet 4 x day if needed for pain 50 tablet 0   No current facility-administered medications on file prior to visit.    Medical History:  Past Medical History  Diagnosis Date  . Obesity   . Anemia   . Hyperlipidemia   . Hypertension   . Sickle cell trait   . Elevated hemoglobin A1c   . DJD (degenerative joint disease)   . DDD (degenerative disc disease)   . GERD (gastroesophageal reflux disease)     Allergies:  Allergies  Allergen Reactions  . Ace Inhibitors     Cough  . Paxil [Paroxetine Hcl]  Mood swings  . Prednisone     Nausea/ irratated  . Vitamin D Analogs     Cramping     Review of Systems:  Review of Systems  Constitutional: Negative for fever, chills and malaise/fatigue.  HENT: Negative for congestion, ear discharge, ear pain and sore throat.   Eyes: Negative.   Respiratory: Negative for cough, shortness of breath and wheezing.   Cardiovascular: Positive for leg swelling (goes away over night and after fluid pills). Negative for chest pain and palpitations.  Gastrointestinal: Positive for constipation. Negative for heartburn, nausea, vomiting, abdominal pain, diarrhea, blood in stool and melena.  Genitourinary: Negative for dysuria,  urgency, frequency and hematuria.  Musculoskeletal: Positive for back pain and joint pain.  Skin: Negative.   Neurological: Negative for dizziness, sensory change, loss of consciousness and headaches.  Psychiatric/Behavioral: Negative for depression. The patient is not nervous/anxious and does not have insomnia.     Family history- Review and unchanged  Social history- Review and unchanged  Physical Exam: BP 122/68 mmHg  Pulse 68  Temp(Src) 97.7 F (36.5 C)  Resp 16  Ht 5' 7.5" (1.715 m)  Wt 342 lb (155.13 kg)  BMI 52.74 kg/m2 Wt Readings from Last 3 Encounters:  10/02/14 342 lb (155.13 kg)  06/18/14 337 lb (152.862 kg)  04/10/14 338 lb (153.316 kg)    General Appearance: Well nourished well developed, in no apparent distress. Eyes: PERRLA, EOMs, conjunctiva no swelling or erythema ENT/Mouth: Ear canals normal without obstruction, swelling, erythma, discharge.  TMs normal bilaterally.  Oropharynx moist, clear, without exudate, or postoropharyngeal swelling. Neck: Supple, thyroid normal,no cervical adenopathy  Respiratory: Respiratory effort normal, Breath sounds clear A&P without rhonchi, wheeze, or rale but breath sounds distant likely due to body habitus.  No retractions, no accessory usage. Cardio: Distant S1S2, RRR with no MRGs. Brisk peripheral pulses without edema.  Abdomen: Morbidly obese, Soft, + BS,  Non tender, no guarding, rebound, hernias, masses. Musculoskeletal: Full ROM, 5/5 strength, Antalgic gait, knee brace on right knee present Tenderness to palpation of low back. Skin: Warm, dry without rashes, lesions, ecchymosis.  Neuro: Awake and oriented X 3, Cranial nerves intact. Normal muscle tone, no cerebellar symptoms. Psych: Normal affect, Insight and Judgment appropriate.    FORCUCCI, Majesty Oehlert, PA-C 11:31 AM [facility]

## 2014-10-03 LAB — LIPID PANEL
CHOLESTEROL: 189 mg/dL (ref 0–200)
HDL: 57 mg/dL (ref >=46)
LDL Cholesterol: 112 mg/dL — ABNORMAL HIGH (ref 0–99)
TRIGLYCERIDES: 99 mg/dL (ref <150)
Total CHOL/HDL Ratio: 3.3 Ratio
VLDL: 20 mg/dL (ref 0–40)

## 2014-10-03 LAB — CBC WITH DIFFERENTIAL/PLATELET
BASOS PCT: 0 % (ref 0–1)
Basophils Absolute: 0 10*3/uL (ref 0.0–0.1)
Eosinophils Absolute: 0.1 10*3/uL (ref 0.0–0.7)
Eosinophils Relative: 2 % (ref 0–5)
HEMATOCRIT: 37.4 % (ref 36.0–46.0)
Hemoglobin: 12.2 g/dL (ref 12.0–15.0)
Lymphocytes Relative: 35 % (ref 12–46)
Lymphs Abs: 2.2 10*3/uL (ref 0.7–4.0)
MCH: 27.4 pg (ref 26.0–34.0)
MCHC: 32.6 g/dL (ref 30.0–36.0)
MCV: 84 fL (ref 78.0–100.0)
MONOS PCT: 7 % (ref 3–12)
MPV: 9.5 fL (ref 8.6–12.4)
Monocytes Absolute: 0.4 10*3/uL (ref 0.1–1.0)
NEUTROS ABS: 3.6 10*3/uL (ref 1.7–7.7)
NEUTROS PCT: 56 % (ref 43–77)
Platelets: 209 10*3/uL (ref 150–400)
RBC: 4.45 MIL/uL (ref 3.87–5.11)
RDW: 14.5 % (ref 11.5–15.5)
WBC: 6.4 10*3/uL (ref 4.0–10.5)

## 2014-10-03 LAB — HEPATIC FUNCTION PANEL
ALBUMIN: 4.1 g/dL (ref 3.5–5.2)
ALK PHOS: 118 U/L — AB (ref 39–117)
ALT: 20 U/L (ref 0–35)
AST: 18 U/L (ref 0–37)
BILIRUBIN TOTAL: 0.9 mg/dL (ref 0.2–1.2)
Bilirubin, Direct: 0.2 mg/dL (ref 0.0–0.3)
Indirect Bilirubin: 0.7 mg/dL (ref 0.2–1.2)
Total Protein: 7.4 g/dL (ref 6.0–8.3)

## 2014-10-03 LAB — BASIC METABOLIC PANEL WITH GFR
BUN: 14 mg/dL (ref 6–23)
CHLORIDE: 102 meq/L (ref 96–112)
CO2: 26 mEq/L (ref 19–32)
CREATININE: 0.68 mg/dL (ref 0.50–1.10)
Calcium: 9.3 mg/dL (ref 8.4–10.5)
Glucose, Bld: 101 mg/dL — ABNORMAL HIGH (ref 70–99)
POTASSIUM: 4.4 meq/L (ref 3.5–5.3)
Sodium: 139 mEq/L (ref 135–145)

## 2014-10-03 LAB — HEMOGLOBIN A1C
Hgb A1c MFr Bld: 5.9 % — ABNORMAL HIGH (ref <5.7)
Mean Plasma Glucose: 123 mg/dL — ABNORMAL HIGH (ref <117)

## 2014-10-03 LAB — INSULIN, RANDOM: Insulin: 13 u[IU]/mL (ref 2.0–19.6)

## 2014-10-03 LAB — MAGNESIUM: MAGNESIUM: 1.9 mg/dL (ref 1.5–2.5)

## 2014-10-06 LAB — VITAMIN D 1,25 DIHYDROXY
VITAMIN D3 1, 25 (OH): 36 pg/mL
Vitamin D 1, 25 (OH)2 Total: 62 pg/mL (ref 18–72)
Vitamin D2 1, 25 (OH)2: 26 pg/mL

## 2015-01-08 ENCOUNTER — Ambulatory Visit (INDEPENDENT_AMBULATORY_CARE_PROVIDER_SITE_OTHER): Payer: 59 | Admitting: Physician Assistant

## 2015-01-08 ENCOUNTER — Encounter: Payer: Self-pay | Admitting: Internal Medicine

## 2015-01-08 VITALS — BP 122/86 | HR 72 | Temp 97.0°F | Resp 16 | Ht 67.5 in | Wt 339.2 lb

## 2015-01-08 DIAGNOSIS — R059 Cough, unspecified: Secondary | ICD-10-CM

## 2015-01-08 DIAGNOSIS — R7309 Other abnormal glucose: Secondary | ICD-10-CM | POA: Diagnosis not present

## 2015-01-08 DIAGNOSIS — R05 Cough: Secondary | ICD-10-CM

## 2015-01-08 DIAGNOSIS — E785 Hyperlipidemia, unspecified: Secondary | ICD-10-CM | POA: Diagnosis not present

## 2015-01-08 DIAGNOSIS — Z79899 Other long term (current) drug therapy: Secondary | ICD-10-CM

## 2015-01-08 DIAGNOSIS — I10 Essential (primary) hypertension: Secondary | ICD-10-CM | POA: Diagnosis not present

## 2015-01-08 DIAGNOSIS — E559 Vitamin D deficiency, unspecified: Secondary | ICD-10-CM

## 2015-01-08 LAB — CBC WITH DIFFERENTIAL/PLATELET
Basophils Absolute: 0 10*3/uL (ref 0.0–0.1)
Basophils Relative: 0 % (ref 0–1)
EOS ABS: 0.1 10*3/uL (ref 0.0–0.7)
Eosinophils Relative: 2 % (ref 0–5)
HCT: 37.6 % (ref 36.0–46.0)
Hemoglobin: 12.5 g/dL (ref 12.0–15.0)
LYMPHS ABS: 2.2 10*3/uL (ref 0.7–4.0)
Lymphocytes Relative: 32 % (ref 12–46)
MCH: 27.8 pg (ref 26.0–34.0)
MCHC: 33.2 g/dL (ref 30.0–36.0)
MCV: 83.7 fL (ref 78.0–100.0)
MPV: 9.3 fL (ref 8.6–12.4)
Monocytes Absolute: 0.5 10*3/uL (ref 0.1–1.0)
Monocytes Relative: 7 % (ref 3–12)
NEUTROS PCT: 59 % (ref 43–77)
Neutro Abs: 4 10*3/uL (ref 1.7–7.7)
PLATELETS: 224 10*3/uL (ref 150–400)
RBC: 4.49 MIL/uL (ref 3.87–5.11)
RDW: 14.2 % (ref 11.5–15.5)
WBC: 6.8 10*3/uL (ref 4.0–10.5)

## 2015-01-08 MED ORDER — AZITHROMYCIN 250 MG PO TABS: ORAL_TABLET | ORAL | Status: AC

## 2015-01-08 MED ORDER — VALSARTAN 320 MG PO TABS: 320.0000 mg | ORAL_TABLET | Freq: Every day | ORAL | Status: DC

## 2015-01-08 NOTE — Progress Notes (Signed)
Assessment and Plan:  Hypertension: Continue medication, monitor blood pressure at home. Continue DASH diet. Cholesterol: Continue diet and exercise. Check cholesterol.  Pre-diabetes-Continue diet and exercise. Check A1C Vitamin D Def- check level and continue medications.  Obesity with co morbidities- long discussion about weight loss, diet, and exercise Cough- stop losartan, try diovan, add allergy pill, zpak, get CXR, no orthopnea/increase in weight but rule out CHF/infection  Continue diet and meds as discussed. Further disposition pending results of labs.   HPI 59 y.o. AA female  presents for 3 month follow up with hypertension, hyperlipidemia, prediabetes and vitamin D. Her blood pressure has been controlled at home, today their BP is BP: 122/86 mmHg She does not workout. She denies chest pain, shortness of breath, dizziness.  She is not on cholesterol medication and denies myalgias. Her cholesterol is at goal. The cholesterol last visit was:   Lab Results  Component Value Date   CHOL 189 10/02/2014   HDL 57 10/02/2014   LDLCALC 112* 10/02/2014   TRIG 99 10/02/2014   CHOLHDL 3.3 10/02/2014   She has been working on diet and exercise for prediabetes, and denies paresthesia of the feet, polydipsia and polyuria. Last A1C in the office was:  Lab Results  Component Value Date   HGBA1C 5.9* 10/02/2014   Patient is not on Vitamin D supplement, she will get cramping if she takes vitamin D.   Lab Results  Component Value Date   VD25OH 15* 06/18/2014     BMI is Body mass index is 52.31 kg/(m^2)., she is working on diet and exercise. Wt Readings from Last 3 Encounters:  01/08/15 339 lb 3.2 oz (153.86 kg)  10/02/14 342 lb (155.13 kg)  06/18/14 337 lb (152.862 kg)   She has back pain, has had ESI injection with Dr. Mina Marble, helped for a while but will still have pain with exertion.  She has had a cough on and off for a year, worse lying down, will wake herself choking at night. Weight  is unchanged/down, no edema or orthopnea to indicate CHF. She is on losartan but no ace.   Current Medications:  Current Outpatient Prescriptions on File Prior to Visit  Medication Sig Dispense Refill  . allopurinol (ZYLOPRIM) 300 MG tablet Take 1 tablet (300 mg total) by mouth daily. 90 tablet 2  . aspirin 81 MG tablet Take 81 mg by mouth daily.    . bumetanide (BUMEX) 1 MG tablet Take 1 tablet (1 mg total) by mouth as needed. 30 tablet 4  . diclofenac sodium (VOLTAREN) 1 % GEL Apply 4 g topically 4 (four) times daily. 100 g 3  . doxazosin (CARDURA) 4 MG tablet Take 4 mg by mouth at bedtime.    . gabapentin (NEURONTIN) 300 MG capsule Take 300 mg by mouth 3 (three) times daily.    Marland Kitchen losartan (COZAAR) 100 MG tablet Take 100 mg by mouth daily.    . naproxen sodium (ANAPROX) 220 MG tablet Take 220 mg by mouth daily as needed.    . traMADol (ULTRAM) 50 MG tablet Take 1 tablet 4 x day if needed for pain 50 tablet 0   No current facility-administered medications on file prior to visit.   Medical History:  Past Medical History  Diagnosis Date  . Obesity   . Anemia   . Hyperlipidemia   . Hypertension   . Sickle cell trait   . Elevated hemoglobin A1c   . DJD (degenerative joint disease)   . DDD (degenerative disc  disease)   . GERD (gastroesophageal reflux disease)    Allergies:  Allergies  Allergen Reactions  . Ace Inhibitors     Cough  . Paxil [Paroxetine Hcl]     Mood swings  . Prednisone     Nausea/ irratated  . Vitamin D Analogs     Cramping    Review of Systems  Constitutional: Negative for fever, chills and malaise/fatigue.  HENT: Negative for congestion, ear discharge, ear pain and sore throat.   Eyes: Negative.   Respiratory: Positive for cough and sputum production. Negative for hemoptysis, shortness of breath and wheezing.   Cardiovascular: Positive for leg swelling (goes away over night and after fluid pills). Negative for chest pain and palpitations.   Gastrointestinal: Positive for constipation. Negative for heartburn, nausea, vomiting, abdominal pain, diarrhea, blood in stool and melena.  Genitourinary: Negative for dysuria, urgency, frequency and hematuria.  Musculoskeletal: Positive for back pain and joint pain.  Skin: Negative.   Neurological: Negative for dizziness, sensory change, loss of consciousness and headaches.  Psychiatric/Behavioral: Negative for depression. The patient is not nervous/anxious and does not have insomnia.     Family history- Review and unchanged Social history- Review and unchanged Physical Exam: BP 122/86 mmHg  Pulse 72  Temp(Src) 97 F (36.1 C)  Resp 16  Ht 5' 7.5" (1.715 m)  Wt 339 lb 3.2 oz (153.86 kg)  BMI 52.31 kg/m2 Wt Readings from Last 3 Encounters:  01/08/15 339 lb 3.2 oz (153.86 kg)  10/02/14 342 lb (155.13 kg)  06/18/14 337 lb (152.862 kg)   General Appearance: Well nourished, in no apparent distress. Eyes: PERRLA, EOMs, conjunctiva no swelling or erythema Sinuses: No Frontal/maxillary tenderness ENT/Mouth: Ext aud canals clear, TMs without erythema, bulging. No erythema, swelling, or exudate on post pharynx.  Tonsils not swollen or erythematous. Hearing normal.  Neck: Supple, thyroid normal.  Respiratory: Respiratory effort normal, decreased BS bilaterally due to body habitus without rales, rhonchi, wheezing or stridor.  Cardio: RRR with no MRGs. Brisk peripheral pulses with 1+ edema.  Abdomen: Soft, + BS.  Non tender, no guarding, rebound, hernias, masses. Lymphatics: Non tender without lymphadenopathy.  Musculoskeletal: Full ROM, 5/5 strength, antalgic gait.  Skin: Warm, dry without rashes, lesions, ecchymosis.  Neuro: Cranial nerves intact. Normal muscle tone, no cerebellar symptoms.  Psych: Awake and oriented X 3, normal affect, Insight and Judgment appropriate.    Vicie Mutters 10:55 AM

## 2015-01-08 NOTE — Patient Instructions (Addendum)
Get chest xray Will stop losartan and try the diovan 320, 1/2 pill at first, monitor BP- the losartan can be causing the cough.   Please pick one of the over the counter allergy medications below and take it once daily for allergies.  Claritin or loratadine cheapest but likely the weakest  Zyrtec or certizine at night because it can make you sleepy The strongest is allegra or fexafinadine  Cheapest at [employer], [employer], [employer]   Think you should get sleep study, here is info I think it is possible that you have sleep apnea. It can cause interrupted sleep, headaches, frequent awakenings, fatigue, dry mouth, fast/slow heart beats, memory issues, anxiety/depression, swelling, numbness tingling hands/feet, weight gain, shortness of breath, and the list goes on. Sleep apnea needs to be ruled out because if it is left untreated it does eventually lead to abnormal heart beats, lung failure or heart failure as well as increasing the risk of heart attack and stroke. There are masks you can wear OR a mouth piece that I can give you information about. Often times though people feel MUCH better after getting treatment.   Sleep Apnea  Sleep apnea is a sleep disorder characterized by abnormal pauses in breathing while you sleep. When your breathing pauses, the level of oxygen in your blood decreases. This causes you to move out of deep sleep and into light sleep. As a result, your quality of sleep is poor, and the system that carries your blood throughout your body (cardiovascular system) experiences stress. If sleep apnea remains untreated, the following conditions can develop:  High blood pressure (hypertension).  Coronary artery disease.  Inability to achieve or maintain an erection (impotence).  Impairment of your thought process (cognitive dysfunction). There are three types of sleep apnea: 1. Obstructive sleep apnea--Pauses in breathing during sleep because of a blocked airway. 2. Central sleep  apnea--Pauses in breathing during sleep because the area of the brain that controls your breathing does not send the correct signals to the muscles that control breathing. 3. Mixed sleep apnea--A combination of both obstructive and central sleep apnea.  RISK FACTORS The following risk factors can increase your risk of developing sleep apnea:  Being overweight.  Smoking.  Having narrow passages in your nose and throat.  Being of older age.  Being female.  Alcohol use.  Sedative and tranquilizer use.  Ethnicity. Among individuals younger than 35 years, African Americans are at increased risk of sleep apnea. SYMPTOMS   Difficulty staying asleep.  Daytime sleepiness and fatigue.  Loss of energy.  Irritability.  Loud, heavy snoring.  Morning headaches.  Trouble concentrating.  Forgetfulness.  Decreased interest in sex. DIAGNOSIS  In order to diagnose sleep apnea, your caregiver will perform a physical examination. Your caregiver may suggest that you take a home sleep test. Your caregiver may also recommend that you spend the night in a sleep lab. In the sleep lab, several monitors record information about your heart, lungs, and brain while you sleep. Your leg and arm movements and blood oxygen level are also recorded. TREATMENT The following actions may help to resolve mild sleep apnea:  Sleeping on your side.   Using a decongestant if you have nasal congestion.   Avoiding the use of depressants, including alcohol, sedatives, and narcotics.   Losing weight and modifying your diet if you are overweight. There also are devices and treatments to help open your airway:  Oral appliances. These are custom-made mouthpieces that shift your lower jaw forward and  slightly open your bite. This opens your airway.  Devices that create positive airway pressure. This positive pressure "splints" your airway open to help you breathe better during sleep. The following devices create  positive airway pressure:  Continuous positive airway pressure (CPAP) device. The CPAP device creates a continuous level of air pressure with an air pump. The air is delivered to your airway through a mask while you sleep. This continuous pressure keeps your airway open.  Nasal expiratory positive airway pressure (EPAP) device. The EPAP device creates positive air pressure as you exhale. The device consists of single-use valves, which are inserted into each nostril and held in place by adhesive. The valves create very little resistance when you inhale but create much more resistance when you exhale. That increased resistance creates the positive airway pressure. This positive pressure while you exhale keeps your airway open, making it easier to breath when you inhale again.  Bilevel positive airway pressure (BPAP) device. The BPAP device is used mainly in patients with central sleep apnea. This device is similar to the CPAP device because it also uses an air pump to deliver continuous air pressure through a mask. However, with the BPAP machine, the pressure is set at two different levels. The pressure when you exhale is lower than the pressure when you inhale.  Surgery. Typically, surgery is only done if you cannot comply with less invasive treatments or if the less invasive treatments do not improve your condition. Surgery involves removing excess tissue in your airway to create a wider passage way. Document Released: 04/30/2002 Document Revised: 09/04/2012 Document Reviewed: 09/16/2011 [facility] Patient Information 2015 [place], [place]. This information is not intended to replace advice given to you by your health care provider. Make sure you discuss any questions you have with your health care provider.

## 2015-01-09 ENCOUNTER — Ambulatory Visit (HOSPITAL_COMMUNITY)
Admission: RE | Admit: 2015-01-09 | Discharge: 2015-01-09 | Disposition: A | Discharge status: Home / Self Care | Payer: 59 | Source: Ambulatory Visit | Attending: Physician Assistant | Admitting: Physician Assistant

## 2015-01-09 DIAGNOSIS — R05 Cough: Secondary | ICD-10-CM | POA: Diagnosis not present

## 2015-01-09 DIAGNOSIS — R059 Cough, unspecified: Secondary | ICD-10-CM

## 2015-01-09 LAB — HEPATIC FUNCTION PANEL
ALBUMIN: 4.4 g/dL (ref 3.6–5.1)
ALK PHOS: 113 U/L (ref 33–130)
ALT: 20 U/L (ref 6–29)
AST: 20 U/L (ref 10–35)
BILIRUBIN DIRECT: 0.2 mg/dL (ref <=0.2)
BILIRUBIN TOTAL: 0.9 mg/dL (ref 0.2–1.2)
Indirect Bilirubin: 0.7 mg/dL (ref 0.2–1.2)
Total Protein: 7.8 g/dL (ref 6.1–8.1)

## 2015-01-09 LAB — BASIC METABOLIC PANEL WITH GFR
BUN: 14 mg/dL (ref 7–25)
CO2: 27 mmol/L (ref 20–31)
Calcium: 9.5 mg/dL (ref 8.6–10.4)
Chloride: 102 mmol/L (ref 98–110)
Creat: 0.64 mg/dL (ref 0.50–0.99)
GFR, Est Non African American: >89 mL/min (ref >=60)
Glucose, Bld: 105 mg/dL — ABNORMAL HIGH (ref 65–99)
POTASSIUM: 4.4 mmol/L (ref 3.5–5.3)
SODIUM: 140 mmol/L (ref 135–146)

## 2015-01-09 LAB — LIPID PANEL
CHOL/HDL RATIO: 3.1 ratio (ref <=5.0)
Cholesterol: 180 mg/dL (ref 125–200)
HDL: 58 mg/dL (ref >=46)
LDL Cholesterol: 102 mg/dL (ref <130)
TRIGLYCERIDES: 102 mg/dL (ref <150)
VLDL: 20 mg/dL (ref <30)

## 2015-01-09 LAB — MAGNESIUM: MAGNESIUM: 2.1 mg/dL (ref 1.5–2.5)

## 2015-01-09 LAB — HEMOGLOBIN A1C
HEMOGLOBIN A1C: 5.7 % — AB (ref <5.7)
Mean Plasma Glucose: 117 mg/dL — ABNORMAL HIGH (ref <117)

## 2015-01-09 LAB — INSULIN, FASTING: Insulin fasting, serum: 11.3 u[IU]/mL (ref 2.0–19.6)

## 2015-01-09 LAB — VITAMIN D 25 HYDROXY (VIT D DEFICIENCY, FRACTURES): Vit D, 25-Hydroxy: 12 ng/mL — ABNORMAL LOW (ref 30–100)

## 2015-01-09 LAB — TSH: TSH: 2.179 u[IU]/mL (ref 0.350–4.500)

## 2015-03-21 ENCOUNTER — Other Ambulatory Visit: Payer: Self-pay | Admitting: Internal Medicine

## 2015-04-11 ENCOUNTER — Encounter: Payer: Self-pay | Admitting: Physician Assistant

## 2015-04-11 ENCOUNTER — Ambulatory Visit (INDEPENDENT_AMBULATORY_CARE_PROVIDER_SITE_OTHER): Payer: 59 | Admitting: Physician Assistant

## 2015-04-11 VITALS — BP 124/68 | HR 99 | Temp 97.7°F | Resp 16 | Ht 67.5 in | Wt 338.0 lb

## 2015-04-11 DIAGNOSIS — M7071 Other bursitis of hip, right hip: Secondary | ICD-10-CM

## 2015-04-11 DIAGNOSIS — I10 Essential (primary) hypertension: Secondary | ICD-10-CM

## 2015-04-11 DIAGNOSIS — Z79899 Other long term (current) drug therapy: Secondary | ICD-10-CM

## 2015-04-11 DIAGNOSIS — R059 Cough, unspecified: Secondary | ICD-10-CM

## 2015-04-11 DIAGNOSIS — E785 Hyperlipidemia, unspecified: Secondary | ICD-10-CM

## 2015-04-11 DIAGNOSIS — R7309 Other abnormal glucose: Secondary | ICD-10-CM

## 2015-04-11 DIAGNOSIS — E559 Vitamin D deficiency, unspecified: Secondary | ICD-10-CM

## 2015-04-11 DIAGNOSIS — R05 Cough: Secondary | ICD-10-CM

## 2015-04-11 LAB — CBC WITH DIFFERENTIAL/PLATELET
BASOS PCT: 0 % (ref 0–1)
Basophils Absolute: 0 10*3/uL (ref 0.0–0.1)
EOS PCT: 2 % (ref 0–5)
Eosinophils Absolute: 0.1 10*3/uL (ref 0.0–0.7)
HCT: 37.4 % (ref 36.0–46.0)
Hemoglobin: 12.1 g/dL (ref 12.0–15.0)
Lymphocytes Relative: 33 % (ref 12–46)
Lymphs Abs: 2.1 10*3/uL (ref 0.7–4.0)
MCH: 27.6 pg (ref 26.0–34.0)
MCHC: 32.4 g/dL (ref 30.0–36.0)
MCV: 85.2 fL (ref 78.0–100.0)
MPV: 9.9 fL (ref 8.6–12.4)
Monocytes Absolute: 0.5 10*3/uL (ref 0.1–1.0)
Monocytes Relative: 7 % (ref 3–12)
Neutro Abs: 3.8 10*3/uL (ref 1.7–7.7)
Neutrophils Relative %: 58 % (ref 43–77)
Platelets: 230 10*3/uL (ref 150–400)
RBC: 4.39 MIL/uL (ref 3.87–5.11)
RDW: 13.9 % (ref 11.5–15.5)
WBC: 6.5 10*3/uL (ref 4.0–10.5)

## 2015-04-11 LAB — HEPATIC FUNCTION PANEL
ALBUMIN: 4.1 g/dL (ref 3.6–5.1)
ALK PHOS: 110 U/L (ref 33–130)
ALT: 18 U/L (ref 6–29)
AST: 17 U/L (ref 10–35)
Bilirubin, Direct: 0.2 mg/dL (ref <=0.2)
Indirect Bilirubin: 0.8 mg/dL (ref 0.2–1.2)
TOTAL PROTEIN: 7.2 g/dL (ref 6.1–8.1)
Total Bilirubin: 1 mg/dL (ref 0.2–1.2)

## 2015-04-11 LAB — LIPID PANEL
Cholesterol: 179 mg/dL (ref 125–200)
HDL: 58 mg/dL (ref >=46)
LDL Cholesterol: 104 mg/dL (ref <130)
Total CHOL/HDL Ratio: 3.1 Ratio (ref <=5.0)
Triglycerides: 87 mg/dL (ref <150)
VLDL: 17 mg/dL (ref <30)

## 2015-04-11 LAB — BASIC METABOLIC PANEL WITH GFR
BUN: 14 mg/dL (ref 7–25)
CALCIUM: 9 mg/dL (ref 8.6–10.4)
CO2: 25 mmol/L (ref 20–31)
Chloride: 103 mmol/L (ref 98–110)
Creat: 0.66 mg/dL (ref 0.50–0.99)
GFR, Est African American: >89 mL/min (ref >=60)
GLUCOSE: 91 mg/dL (ref 65–99)
Potassium: 4.2 mmol/L (ref 3.5–5.3)
SODIUM: 138 mmol/L (ref 135–146)

## 2015-04-11 LAB — HEMOGLOBIN A1C
HEMOGLOBIN A1C: 5.7 % — AB (ref <5.7)
Mean Plasma Glucose: 117 mg/dL — ABNORMAL HIGH (ref <117)

## 2015-04-11 LAB — MAGNESIUM: MAGNESIUM: 2.1 mg/dL (ref 1.5–2.5)

## 2015-04-11 MED ORDER — LOSARTAN POTASSIUM 100 MG PO TABS
100.0000 mg | ORAL_TABLET | Freq: Every day | ORAL | Status: AC
Start: 2015-04-11 — End: 2016-04-10

## 2015-04-11 MED ORDER — TRAMADOL HCL 50 MG PO TABS: ORAL_TABLET | ORAL | Status: DC

## 2015-04-11 NOTE — Patient Instructions (Signed)
Stop diovan due to cost, try losartan but if continue to have cough it is from losartan.   We want weight loss that will last so you should lose 1-2 pounds a week.  THAT IS IT! Please pick THREE things a month to change. Once it is a habit check off the item. Then pick another three items off the list to become habits.  If you are already doing a habit on the list GREAT!  Cross that item off! o Don't drink your calories. Ie, alcohol, soda, fruit juice, and sweet tea.  o Drink more water. Drink a glass when you feel hungry or before each meal.  o Eat breakfast - Complex carb and protein (likeDannon light and fit yogurt, oatmeal, fruit, eggs, [place] bacon). o Measure your cereal.  Eat no more than one cup a day. (ie [place]) o Eat an apple a day. o Add a vegetable a day. o Try a new vegetable a month. o Use Pam! Stop using oil or butter to cook. o Don't finish your plate or use smaller plates. o Share your dessert. o Eat sugar free Jello for dessert or frozen grapes. o Don't eat 2-3 hours before bed. o Switch to whole wheat bread, pasta, and brown rice. o Make healthier choices when you eat out. No fries! o Pick baked chicken, NOT fried. o Don't forget to SLOW DOWN when you eat. It is not going anywhere.  o Take the stairs. o Park far away in the parking lot o [employer] (or weights) for 10 minutes while watching TV. o Walk at work for 10 minutes during break. o Walk outside 1 time a week with your friend, kids, dog, or significant other. o Start a walking group at [place] the mall as much as you can tolerate.  o Keep a food diary. o Weigh yourself daily. o Walk for 15 minutes 3 days per week. o Cook at home more often and eat out less.  If life happens and you go back to old habits, it is okay.  Just start over. You can do it!   If you experience chest pain, get short of breath, or tired during the exercise, please stop immediately and inform your doctor.   Before you even  begin to attack a weight-loss plan, it pays to remember this: You are not fat. You have fat. Losing weight isn't about blame or shame; it's simply another achievement to accomplish. Dieting is like any other skill-you have to buckle down and work at it. As long as you act in a smart, reasonable way, you'll ultimately get where you want to be. Here are some weight loss pearls for you.  1. It's Not a Diet. It's a Lifestyle Thinking of a diet as something you're on and suffering through only for the short term doesn't work. To shed weight and keep it off, you need to make permanent changes to the way you eat. It's OK to indulge occasionally, of course, but if you cut calories temporarily and then revert to your old way of eating, you'll gain back the weight quicker than you can say yo-yo. Use it to lose it. Research shows that one of the best predictors of long-term weight loss is how many pounds you drop in the first month. For that reason, nutritionists often suggest being stricter for the first two weeks of your new eating strategy to build momentum. Cut out added sugar and alcohol and avoid unrefined carbs. After that,  figure out how you can reincorporate them in a way that's healthy and maintainable.  2. There's a Right Way to Exercise Working out burns calories and fat and boosts your metabolism by building muscle. But those trying to lose weight are notorious for overestimating the number of calories they burn and underestimating the amount they take in. Unfortunately, your system is biologically programmed to hold on to extra pounds and that means when you start exercising, your body senses the deficit and ramps up its hunger signals. If you're not diligent, you'll eat everything you burn and then some. Use it to lose it. Cardio gets all the exercise glory, but strength and interval training are the real heroes. They help you build lean muscle, which in turn increases your metabolism and calorie-burning  ability 3. Don't Overreact to Mild Hunger Some people have a hard time losing weight because of hunger anxiety. To them, being hungry is bad-something to be avoided at all costs-so they carry snacks with them and eat when they don't need to. Others eat because they're stressed out or bored. While you never want to get to the point of being ravenous (that's when bingeing is likely to happen), a hunger pang, a craving, or the fact that it's 3:00 p.m. should not send you racing for the vending machine or obsessing about the energy bar in your purse. Ideally, you should put off eating until your stomach is growling and it's difficult to concentrate.  Use it to lose it. When you feel the urge to eat, use the HALT method. Ask yourself, Am I really hungry? Or am I angry or anxious, lonely or bored, or tired? If you're still not certain, try the apple test. If you're truly hungry, an apple should seem delicious; if it doesn't, something else is going on. Or you can try drinking water and making yourself busy, if you are still hungry try a healthy snack.  4. Not All Calories Are Created Equal The mechanics of weight loss are pretty simple: Take in fewer calories than you use for energy. But the kind of food you eat makes all the difference. Processed food that's high in saturated fat and refined starch or sugar can cause inflammation that disrupts the hormone signals that tell your brain you're full. The result: You eat a lot more.  Use it to lose it. Clean up your diet. Swap in whole, unprocessed foods, including vegetables, lean protein, and healthy fats that will fill you up and give you the biggest nutritional bang for your calorie buck. In a few weeks, as your brain starts receiving regular hunger and fullness signals once again, you'll notice that you feel less hungry overall and naturally start cutting back on the amount you eat.  5. Protein, Produce, and Plant-Based Fats Are Your Weight-Loss Trinity Here's  why eating the three Ps regularly will help you drop pounds. Protein fills you up. You need it to build lean muscle, which keeps your metabolism humming so that you can torch more fat. People in a weight-loss program who ate double the recommended daily allowance for protein (about 110 grams for a 150-pound woman) lost 70 percent of their weight from fat, while people who ate the RDA lost only about 40 percent, one study found. Produce is packed with filling fiber. "It's very difficult to consume too many calories if you're eating a lot of vegetables. Example: Three cups of broccoli is a lot of food, yet only 93 calories. (Fruit is another story.  It can be easy to overeat and can contain a lot of calories from sugar, so be sure to monitor your intake.) Plant-based fats like olive oil and those in avocados and nuts are healthy and extra satiating.  Use it to lose it. Aim to incorporate each of the three Ps into every meal and snack. People who eat protein throughout the day are able to keep weight off, according to a study in the [place] of Clinical Nutrition. In addition to meat, poultry and seafood, good sources are beans, lentils, eggs, tofu, and yogurt. As for fat, keep portion sizes in check by measuring out salad dressing, oil, and nut butters (shoot for one to two tablespoons). Finally, eat veggies or a little fruit at every meal. People who did that consumed 308 fewer calories but didn't feel any hungrier than when they didn't eat more produce.  7. How You Eat Is As Important As What You Eat In order for your brain to register that you're full, you need to focus on what you're eating. Sit down whenever you eat, preferably at a table. Turn off the TV or computer, put down your phone, and look at your food. Smell it. Chew slowly, and don't put another bite on your fork until you swallow. When women ate lunch this attentively, they consumed 30 percent less when snacking later than those who  listened to an audiobook at lunchtime, according to a study in the [place] of Nutrition. 8. Weighing Yourself Really Works The scale provides the best evidence about whether your efforts are paying off. Seeing the numbers tick up or down or stagnate is motivation to keep going-or to rethink your approach. A 2015 study at [facility] found that daily weigh-ins helped people lose more weight, keep it off, and maintain that loss, even after two years. Use it to lose it. Step on the scale at the same time every day for the best results. If your weight shoots up several pounds from one weigh-in to the next, don't freak out. Eating a lot of salt the night before or having your period is the likely culprit. The number should return to normal in a day or two. It's a steady climb that you need to do something about. 9. Too Much Stress and Too Little Sleep Are Your Enemies When you're tired and frazzled, your body cranks up the production of cortisol, the stress hormone that can cause carb cravings. Not getting enough sleep also boosts your levels of ghrelin, a hormone associated with hunger, while suppressing leptin, a hormone that signals fullness and satiety. People on a diet who slept only five and a half hours a night for two weeks lost 55 percent less fat and were hungrier than those who slept eight and a half hours, according to a study in the [place]. Use it to lose it. Prioritize sleep, aiming for seven hours or more a night, which research shows helps lower stress. And make sure you're getting quality zzz's. If a snoring spouse or a fidgety cat wakes you up frequently throughout the night, you may end up getting the equivalent of just four hours of sleep, according to a study from [facility]. Keep pets out of the bedroom, and use a white-noise app to drown out snoring. 10. You Will Hit a plateau-And You Can Bust Through It As you slim down, your body  releases much less leptin, the fullness hormone.  If you're not strength training, start  right now. Building muscle can raise your metabolism to help you overcome a plateau. To keep your body challenged and burning calories, incorporate new moves and more intense intervals into your workouts or add another sweat session to your weekly routine. Alternatively, cut an extra 100 calories or so a day from your diet. Now that you've lost weight, your body simply doesn't need as much fuel.      Bad carbs also include fruit juice, alcohol, and sweet tea. These are empty calories that do not signal to your brain that you are full.   Please remember the good carbs are still carbs which convert into sugar. So please measure them out no more than 1/2-1 cup of rice, oatmeal, pasta, and beans  Veggies are however free foods! Pile them on.   Not all fruit is created equal. Please see the list below, the fruit at the bottom is higher in sugars than the fruit at the top. Please avoid all dried fruits.

## 2015-04-11 NOTE — Progress Notes (Signed)
Assessment and Plan:  Hypertension: diovan too expensive, will get back on losartan, understands cough can come from this, monitor blood pressure at home. Continue DASH diet. Cholesterol: Continue diet and exercise. Check cholesterol.  Pre-diabetes-Continue diet and exercise. Check A1C Vitamin D Def- check level and continue medications.  Obesity with co morbidities- long discussion about weight loss, diet, and exercise Back pain/joint pain- tramadol refill, weight loss advised.   Continue diet and meds as discussed. Further disposition pending results of labs.   HPI 59 y.o. AA female  presents for 3 month follow up with hypertension, hyperlipidemia, prediabetes and vitamin D. Her blood pressure has been controlled at home, she was switched from losartan to diovan and the cough is better but the losartan is cheaper, today their BP is BP: 124/68 mmHg She does not workout. She denies chest pain, shortness of breath, dizziness.  She is not on cholesterol medication and denies myalgias. Her cholesterol is at goal. The cholesterol last visit was:   Lab Results  Component Value Date   CHOL 180 01/08/2015   HDL 58 01/08/2015   LDLCALC 102 01/08/2015   TRIG 102 01/08/2015   CHOLHDL 3.1 01/08/2015   She has been working on diet and exercise for prediabetes, and denies paresthesia of the feet, polydipsia and polyuria. Last A1C in the office was:  Lab Results  Component Value Date   HGBA1C 5.7* 01/08/2015   Patient is not on Vitamin D supplement, she will get cramping if she takes vitamin D.   Lab Results  Component Value Date   VD25OH 12* 01/08/2015     BMI is Body mass index is 52.13 kg/(m^2)., she is working on diet and exercise. Wt Readings from Last 3 Encounters:  04/11/15 338 lb (153.316 kg)  01/08/15 339 lb 3.2 oz (153.86 kg)  10/02/14 342 lb (155.13 kg)   She has back pain, has had ESI injection with Dr. Mina Marble, helped for a while but will still have pain with exertion. Will take 1  tramadol a day, not in pain contract, asking for [place] to fill here to avoid copay of specialist.    Current Medications:  Current Outpatient Prescriptions on File Prior to Visit  Medication Sig Dispense Refill  . allopurinol (ZYLOPRIM) 300 MG tablet Take 1 tablet (300 mg total) by mouth daily. 90 tablet 2  . aspirin 81 MG tablet Take 81 mg by mouth daily.    . bumetanide (BUMEX) 1 MG tablet Take 1 tablet (1 mg total) by mouth as needed. 30 tablet 4  . diclofenac sodium (VOLTAREN) 1 % GEL Apply 4 g topically 4 (four) times daily. 100 g 3  . doxazosin (CARDURA) 4 MG tablet TAKE 1 TO 2 TABLETS BY MOUTH AT BEDTIME FOR BLOOD PRESSURE 180 tablet 1  . gabapentin (NEURONTIN) 300 MG capsule Take 300 mg by mouth 3 (three) times daily.    . naproxen sodium (ANAPROX) 220 MG tablet Take 220 mg by mouth daily as needed.    . traMADol (ULTRAM) 50 MG tablet Take 1 tablet 4 x day if needed for pain 50 tablet 0  . valsartan (DIOVAN) 320 MG tablet Take 1 tablet (320 mg total) by mouth daily. 30 tablet 3   No current facility-administered medications on file prior to visit.   Medical History:  Past Medical History  Diagnosis Date  . Obesity   . Anemia   . Hyperlipidemia   . Hypertension   . Sickle cell trait ([place])   . Elevated hemoglobin  A1c   . DJD (degenerative joint disease)   . DDD (degenerative disc disease)   . GERD (gastroesophageal reflux disease)    Allergies:  Allergies  Allergen Reactions  . Ace Inhibitors     Cough  . Paxil [Paroxetine Hcl]     Mood swings  . Prednisone     Nausea/ irratated  . Vitamin D Analogs     Cramping    Review of Systems  Constitutional: Negative for fever, chills and malaise/fatigue.  HENT: Negative for congestion, ear discharge, ear pain and sore throat.   Eyes: Negative.   Respiratory: Negative for cough, hemoptysis, sputum production, shortness of breath and wheezing.   Cardiovascular: Positive for leg swelling (goes away over night and after fluid  pills). Negative for chest pain and palpitations.  Gastrointestinal: Negative for heartburn, nausea, vomiting, abdominal pain, diarrhea, constipation, blood in stool and melena.  Genitourinary: Negative for dysuria, urgency, frequency and hematuria.  Musculoskeletal: Positive for back pain and joint pain.  Skin: Negative.   Neurological: Negative for dizziness, sensory change, loss of consciousness and headaches.  Psychiatric/Behavioral: Negative for depression. The patient is not nervous/anxious and does not have insomnia.     Family history- Review and unchanged Social history- Review and unchanged Physical Exam: BP 124/68 mmHg  Pulse 99  Temp(Src) 97.7 F (36.5 C) (Temporal)  Resp 16  Ht 5' 7.5" (1.715 m)  Wt 338 lb (153.316 kg)  BMI 52.13 kg/m2  SpO2 93% Wt Readings from Last 3 Encounters:  04/11/15 338 lb (153.316 kg)  01/08/15 339 lb 3.2 oz (153.86 kg)  10/02/14 342 lb (155.13 kg)   General Appearance: Well nourished, in no apparent distress. Eyes: PERRLA, EOMs, conjunctiva no swelling or erythema Sinuses: No Frontal/maxillary tenderness ENT/Mouth: Ext aud canals clear, TMs without erythema, bulging. No erythema, swelling, or exudate on post pharynx.  Tonsils not swollen or erythematous. Hearing normal.  Neck: Supple, thyroid normal.  Respiratory: Respiratory effort normal, decreased BS bilaterally due to body habitus without rales, rhonchi, wheezing or stridor.  Cardio: RRR with no MRGs. Brisk peripheral pulses with 1+ edema.  Abdomen: Soft, + BS.  Non tender, no guarding, rebound, hernias, masses. Lymphatics: Non tender without lymphadenopathy.  Musculoskeletal: Full ROM, 5/5 strength, antalgic gait.  Skin: Warm, dry without rashes, lesions, ecchymosis.  Neuro: Cranial nerves intact. Normal muscle tone, no cerebellar symptoms.  Psych: Awake and oriented X 3, normal affect, Insight and Judgment appropriate.    Vicie Mutters 11:07 AM

## 2015-04-12 LAB — TSH: TSH: 2.179 u[IU]/mL (ref 0.350–4.500)

## 2015-04-12 LAB — VITAMIN D 25 HYDROXY (VIT D DEFICIENCY, FRACTURES): VIT D 25 HYDROXY: 16 ng/mL — AB (ref 30–100)

## 2015-06-02 ENCOUNTER — Other Ambulatory Visit: Payer: Self-pay | Admitting: Internal Medicine

## 2015-06-02 DIAGNOSIS — M150 Primary generalized (osteo)arthritis: Principal | ICD-10-CM

## 2015-06-02 DIAGNOSIS — M8949 Other hypertrophic osteoarthropathy, multiple sites: Secondary | ICD-10-CM

## 2015-06-02 DIAGNOSIS — M159 Polyosteoarthritis, unspecified: Secondary | ICD-10-CM

## 2015-06-18 ENCOUNTER — Other Ambulatory Visit: Payer: Self-pay

## 2015-06-18 DIAGNOSIS — Z1231 Encounter for screening mammogram for malignant neoplasm of breast: Secondary | ICD-10-CM

## 2015-07-01 ENCOUNTER — Ambulatory Visit: Payer: 59 | Admitting: Internal Medicine

## 2015-07-01 ENCOUNTER — Encounter: Payer: Self-pay | Admitting: Internal Medicine

## 2015-07-01 VITALS — BP 132/80 | HR 80 | Temp 97.7°F | Resp 16 | Ht 67.0 in | Wt 333.0 lb

## 2015-07-01 DIAGNOSIS — E785 Hyperlipidemia, unspecified: Secondary | ICD-10-CM

## 2015-07-01 DIAGNOSIS — E559 Vitamin D deficiency, unspecified: Secondary | ICD-10-CM

## 2015-07-01 DIAGNOSIS — M1000 Idiopathic gout, unspecified site: Secondary | ICD-10-CM

## 2015-07-01 DIAGNOSIS — R5383 Other fatigue: Secondary | ICD-10-CM

## 2015-07-01 DIAGNOSIS — Z23 Encounter for immunization: Secondary | ICD-10-CM

## 2015-07-01 DIAGNOSIS — Z1212 Encounter for screening for malignant neoplasm of rectum: Secondary | ICD-10-CM

## 2015-07-01 DIAGNOSIS — R7309 Other abnormal glucose: Secondary | ICD-10-CM

## 2015-07-01 DIAGNOSIS — I10 Essential (primary) hypertension: Secondary | ICD-10-CM

## 2015-07-01 DIAGNOSIS — Z79899 Other long term (current) drug therapy: Secondary | ICD-10-CM

## 2015-07-01 DIAGNOSIS — Z0001 Encounter for general adult medical examination with abnormal findings: Secondary | ICD-10-CM

## 2015-07-01 LAB — CBC WITH DIFFERENTIAL/PLATELET
Basophils Absolute: 0 10*3/uL (ref 0.0–0.1)
Basophils Relative: 0 % (ref 0–1)
EOS PCT: 2 % (ref 0–5)
Eosinophils Absolute: 0.2 10*3/uL (ref 0.0–0.7)
HEMATOCRIT: 40.2 % (ref 36.0–46.0)
Hemoglobin: 13 g/dL (ref 12.0–15.0)
LYMPHS ABS: 2.7 10*3/uL (ref 0.7–4.0)
LYMPHS PCT: 34 % (ref 12–46)
MCH: 27.3 pg (ref 26.0–34.0)
MCHC: 32.3 g/dL (ref 30.0–36.0)
MCV: 84.5 fL (ref 78.0–100.0)
MPV: 9.8 fL (ref 8.6–12.4)
Monocytes Absolute: 0.4 10*3/uL (ref 0.1–1.0)
Monocytes Relative: 5 % (ref 3–12)
NEUTROS PCT: 59 % (ref 43–77)
Neutro Abs: 4.6 10*3/uL (ref 1.7–7.7)
Platelets: 268 10*3/uL (ref 150–400)
RBC: 4.76 MIL/uL (ref 3.87–5.11)
RDW: 13.9 % (ref 11.5–15.5)
WBC: 7.8 10*3/uL (ref 4.0–10.5)

## 2015-07-01 LAB — HEPATIC FUNCTION PANEL
ALBUMIN: 4.5 g/dL (ref 3.6–5.1)
ALT: 18 U/L (ref 6–29)
AST: 19 U/L (ref 10–35)
Alkaline Phosphatase: 114 U/L (ref 33–130)
BILIRUBIN TOTAL: 1 mg/dL (ref 0.2–1.2)
Bilirubin, Direct: 0.2 mg/dL (ref <=0.2)
Indirect Bilirubin: 0.8 mg/dL (ref 0.2–1.2)
Total Protein: 8.2 g/dL — ABNORMAL HIGH (ref 6.1–8.1)

## 2015-07-01 LAB — IRON AND TIBC
%SAT: 19 % (ref 11–50)
IRON: 76 ug/dL (ref 45–160)
TIBC: 410 ug/dL (ref 250–450)
UIBC: 334 ug/dL (ref 125–400)

## 2015-07-01 LAB — LIPID PANEL
CHOL/HDL RATIO: 2.8 ratio (ref <=5.0)
Cholesterol: 202 mg/dL — ABNORMAL HIGH (ref 125–200)
HDL: 71 mg/dL (ref >=46)
LDL CALC: 111 mg/dL (ref <130)
TRIGLYCERIDES: 99 mg/dL (ref <150)
VLDL: 20 mg/dL (ref <30)

## 2015-07-01 LAB — BASIC METABOLIC PANEL WITH GFR
BUN: 17 mg/dL (ref 7–25)
CHLORIDE: 101 mmol/L (ref 98–110)
CO2: 27 mmol/L (ref 20–31)
CREATININE: 0.74 mg/dL (ref 0.50–0.99)
Calcium: 9.3 mg/dL (ref 8.6–10.4)
GFR, Est African American: >89 mL/min (ref >=60)
GFR, Est Non African American: 88 mL/min (ref >=60)
GLUCOSE: 108 mg/dL — AB (ref 65–99)
POTASSIUM: 4.5 mmol/L (ref 3.5–5.3)
Sodium: 138 mmol/L (ref 135–146)

## 2015-07-01 LAB — MAGNESIUM: MAGNESIUM: 2 mg/dL (ref 1.5–2.5)

## 2015-07-01 LAB — URIC ACID: Uric Acid, Serum: 6.9 mg/dL (ref 2.4–7.0)

## 2015-07-01 NOTE — Progress Notes (Signed)
Patient ID: Alicia Diaz, female   DOB: 25-Nov-1955, 60 y.o.   MRN: [id number]  Annual Screening/Preventative Visit And Comprehensive Evaluation &  Examination  This very nice 60 y.o. DBF presents for presents for a Wellness/Preventative Visit & comprehensive evaluation and management of multiple medical co-morbidities.  Patient has been followed for HTN, Morbid Obesity, Prediabetes, Hyperlipidemia and Vitamin D Deficiency. Patient has hx/o gout apparently controlled on allopurinol.     HTN predates since 60.  Patient's BP has been controlled at home and patient denies any cardiac symptoms as chest pain, palpitations, shortness of breath, dizziness or ankle swelling. Today's BP: 132/80 mmHg    Patient's hyperlipidemia is controlled with diet and medications. Patient denies myalgias or other medication SE's. Last lipids were near goal with Cholesterol 179; HDL 58; LDL 104; Triglycerides 87 on 04/11/2015.   Patient has Morbid Obesity (BMI 52+) and consequent Prediabetes predating since  2011 with A1c 5.9%  and patient denies reactive hypoglycemic symptoms, visual blurring, diabetic polys, or paresthesias. Her Insurance provider has refused x 2 in the past to consider her for Bariatric surgery. Last A1c was 5.7% on 04/11/2015.   Finally, patient has history of Vitamin D Deficiency  Of "74" in 2008 and last Vitamin D was still extremely low at 16 on 04/11/2015 felt re;ated to the small dose that's she's been taking.   Medication Sig  . allopurinol  300 MG tablet Take 1 tablet (300 mg total) by mouth daily.   Vitamin 2,000 units Take s 3 x week  . aspirin 81 MG tablet Take 81 mg by mouth daily.  . bumetanide  1 MG tablet Take 1 tablet (1 mg total) by mouth as needed.  . doxazosin (CARDURA) 4 MG tablet TAKE 1 TO 2 TABLETS BY MOUTH AT BEDTIME FOR BLOOD PRESSURE  . gabapentin300 MG capsule Take 300 mg by mouth 3 (three) times daily.  Marland Kitchen losartan (COZAAR) 100 MG tablet Take 1 tablet (100 mg total) by  mouth daily.  . naproxen sodium220 MG tablet Take 220 mg by mouth daily as needed.  . traMADol (ULTRAM) 50 MG tablet Take 1 tablet 4 x day only if needed for pain - usu 1 x/da  . valsartan (DIOVAN) 320 MG tablet Take 1 tablet (320 mg total) by mouth daily.   Allergies  Allergen Reactions  . Ace Inhibitors     Cough  . Paxil [Paroxetine Hcl]     Mood swings  . Prednisone     Nausea/ irratated  . Vitamin D Analogs     Cramping   Past Medical History  Diagnosis Date  . Obesity   . Anemia   . Hyperlipidemia   . Hypertension   . Sickle cell trait ([place])   . Elevated hemoglobin A1c   . DJD (degenerative joint disease)   . DDD (degenerative disc disease)   . GERD (gastroesophageal reflux disease)    Health Maintenance  Topic Date Due  . TETANUS/TDAP  06/08/1973  . PAP SMEAR  06/09/1975  . COLONOSCOPY  06/08/2004  . ZOSTAVAX  06/08/2014  . INFLUENZA VACCINE  12/23/2014  . MAMMOGRAM  05/29/2016  . Hepatitis C Screening  Completed  . HIV Screening  Completed   Immunization History  Administered Date(s) Administered  . PPD Test 05/02/2013, 06/18/2014  . Td 07/01/2015   Past Surgical History  Procedure Laterality Date  . Shoulder surgery Left 2007   Family History  Problem Relation Age of Onset  . Heart disease Mother   .  Cancer Father     prostate  . Multiple myeloma Father   . Diabetes Sister   . Mental illness Sister   . Diabetes Brother   . Obstructive Sleep Apnea Brother    Social History  Substance Use Topics  . Smoking status: Never Smoker   . Smokeless tobacco: None  . Alcohol Use: 3.0 oz/week    6 drink(s) per week    ROS Constitutional: Denies fever, chills, weight loss/gain, headaches, insomnia,  night sweats, and change in appetite. Does c/o fatigue. Eyes: Denies redness, blurred vision, diplopia, discharge, itchy, watery eyes.  ENT: Denies discharge, congestion, post nasal drip, epistaxis, sore throat, earache, hearing loss, dental pain, Tinnitus,  Vertigo, Sinus pain, snoring.  Cardio: Denies chest pain, palpitations, irregular heartbeat, syncope, dyspnea, diaphoresis, orthopnea, PND, claudication, edema Respiratory: denies cough, dyspnea, DOE, pleurisy, hoarseness, laryngitis, wheezing.  Gastrointestinal: Denies dysphagia, heartburn, reflux, water brash, pain, cramps, nausea, vomiting, bloating, diarrhea, constipation, hematemesis, melena, hematochezia, jaundice, hemorrhoids Genitourinary: Denies dysuria, frequency, urgency, nocturia, hesitancy, discharge, hematuria, flank pain Breast: Breast lumps, nipple discharge, bleeding.  Musculoskeletal: Denies arthralgia, myalgia, stiffness, Jt. Swelling, pain, limp, and strain/sprain. Denies falls. Skin: Denies puritis, rash, hives, warts, acne, eczema, changing in skin lesion Neuro: No weakness, tremor, incoordination, spasms, paresthesia, pain Psychiatric: Denies confusion, memory loss, sensory loss. Denies Depression. Endocrine: Denies change in weight, skin, hair change, nocturia, and paresthesia, diabetic polys, visual blurring, hyper / hypo glycemic episodes.  Heme/Lymph: No excessive bleeding, bruising, enlarged lymph nodes.  Physical Exam  BP 132/80 mmHg  Pulse 80  Temp(Src) 97.7 F (36.5 C)  Resp 16  Ht '5\' 7"'  (1.702 m)  Wt 333 lb (151.048 kg)  BMI 52.14 kg/m2  SpO2 98%  General Appearance: Well nourished and in no apparent distress. Eyes: PERRLA, EOMs, conjunctiva no swelling or erythema, normal fundi and vessels. Sinuses: No frontal/maxillary tenderness ENT/Mouth: EACs patent / TMs  nl. Nares clear without erythema, swelling, mucoid exudates. Oral hygiene is good. No erythema, swelling, or exudate. Tongue normal, non-obstructing. Tonsils not swollen or erythematous. Hearing normal.  Neck: Supple, thyroid normal. No bruits, nodes or JVD. Respiratory: Respiratory effort normal.  BS equal and clear bilateral without rales, rhonci, wheezing or stridor. Cardio: Heart sounds are  normal with regular rate and rhythm and no murmurs, rubs or gallops. Peripheral pulses are normal and equal bilaterally without edema. No aortic or femoral bruits. Chest: symmetric with normal excursions and percussion. Breasts: Symmetric, without lumps, nipple discharge, retractions, or fibrocystic changes.  Abdomen: Flat, soft, with bowl sounds. Nontender, no guarding, rebound, hernias, masses, or organomegaly.  Lymphatics: Non tender without lymphadenopathy.  Genitourinary:  Musculoskeletal: Full ROM all peripheral extremities, joint stability, 5/5 strength, and normal gait. Skin: Warm and dry without rashes, lesions, cyanosis, clubbing or  ecchymosis.  Neuro: Cranial nerves intact, reflexes equal bilaterally. Normal muscle tone, no cerebellar symptoms. Sensation intact.  Pysch: Alert and oriented X 3, normal affect, Insight and Judgment appropriate.   Assessment and Plan  1. Annual Preventative Screening Examination   2. Essential hypertension  - Microalbumin / creatinine urine ratio - EKG 12-Lead - [place], RETROPERITNL ABD,  LTD - TSH  3. Hyperlipidemia  - Lipid panel - TSH  4. PreDiabetes  - Hemoglobin A1c - Insulin, random  5. Vitamin D deficiency  - VITAMIN D 25 Hydroxy   6. Morbid obesity ([place])   7. Idiopathic gout  - Uric acid  8. Other fatigue  - Vitamin B12 - Iron and TIBC - CBC with Differential/Platelet -  TSH  9. Medication management  - Urinalysis, Routine w reflex microscopic  - CBC with Differential/Platelet - BASIC METABOLIC PANEL WITH GFR - Hepatic function panel - Magnesium  10. Screening for rectal cancer  - POC Hemoccult Bld/Stl    Continue prudent diet as discussed, weight control, BP monitoring, regular exercise, and medications. Discussed med's effects and SE's. Screening labs and tests as requested with regular follow-up as recommended. Over 40 minutes of exam, counseling, chart review and high complex critical decision making was  performed.

## 2015-07-01 NOTE — Patient Instructions (Signed)
Recommend Adult Low Dose Aspirin or   coated  Aspirin 81 mg daily   To reduce risk of Colon Cancer 20 %,   Skin Cancer 26 % ,   Melanoma 46%   and   Pancreatic cancer 60%   ++++++++++++++++++++++++++++++++++++++++++++++++++++++ Vitamin D goal   is between 70-100.   Please make sure that you are taking your Vitamin D as directed.   It is very important as a natural anti-inflammatory   helping hair, skin, and nails, as well as reducing stroke and heart attack risk.   It helps your bones and helps with mood.  It also decreases numerous cancer risks so please take it as directed.   Low Vit D is associated with a 200-300% higher risk for CANCER   and 200-300% higher risk for HEART   ATTACK  &  STROKE.   .....................................Marland Kitchen  It is also associated with higher death rate at younger ages,   autoimmune diseases like Rheumatoid arthritis, Lupus, Multiple Sclerosis.     Also many other serious conditions, like depression, Alzheimer's  Dementia, infertility, muscle aches, fatigue, fibromyalgia - just to name a few.  ++++++++++++++++++++++++++++++++++++++++++++++++  Recommend the book "The END of DIETING" by Dr Excell Seltzer   & the book "The END of DIABETES " by Dr Excell Seltzer  At [email or web address] - get book & Audio CD's     Being diabetic has a  300% increased risk for heart attack, stroke, cancer, and alzheimer- type vascular dementia. It is very important that you work harder with diet by avoiding all foods that are white. Avoid white rice (brown & wild rice is OK), white potatoes (sweetpotatoes in moderation is OK), White bread or wheat bread or anything made out of white flour like bagels, donuts, rolls, buns, biscuits, cakes, pastries, cookies, pizza crust, and pasta (made from white flour & egg whites) - vegetarian pasta or spinach or wheat pasta is OK. Multigrain breads like Arnold's or Pepperidge [employer], or multigrain sandwich thins or flatbreads.  Diet,  exercise and weight loss can reverse and cure diabetes in the early stages.  Diet, exercise and weight loss is very important in the control and prevention of complications of diabetes which affects every system in your body, ie. Brain - dementia/stroke, eyes - glaucoma/blindness, heart - heart attack/heart failure, kidneys - dialysis, stomach - gastric paralysis, intestines - malabsorption, nerves - severe painful neuritis, circulation - gangrene & loss of a leg(s), and finally cancer and Alzheimers.    I recommend avoid fried & greasy foods,  sweets/candy, white rice (brown or wild rice or Quinoa is OK), white potatoes (sweet potatoes are OK) - anything made from white flour - bagels, doughnuts, rolls, buns, biscuits,white and wheat breads, pizza crust and traditional pasta made of white flour & egg white(vegetarian pasta or spinach or wheat pasta is OK).  Multi-grain bread is OK - like multi-grain flat bread or sandwich thins. Avoid alcohol in excess. Exercise is also important.    Eat all the vegetables you want - avoid meat, especially red meat and dairy - especially cheese.  Cheese is the most concentrated form of trans-fats which is the worst thing to clog up our arteries. Veggie cheese is OK which can be found in the fresh produce section at [employer] or [employer] or [employer]  ++++++++++++++++++++++++++++++++++++++++++++++++++ DASH Eating Plan  DASH stands for "Dietary Approaches to Stop Hypertension."   The DASH eating plan is a healthy eating plan that has been shown to reduce high blood  pressure (hypertension). Additional health benefits may include reducing the risk of type 2 diabetes mellitus, heart disease, and stroke. The DASH eating plan may also help with weight loss.  WHAT DO I NEED TO KNOW ABOUT THE DASH EATING PLAN?  For the DASH eating plan, you will follow these general guidelines:  Choose foods with a percent daily value for sodium of less than 5% (as listed on the food  label).  Use salt-free seasonings or herbs instead of table salt or sea salt.  Check with your health care provider or pharmacist before using salt substitutes.  Eat lower-sodium products, often labeled as "lower sodium" or "no salt added."  Eat fresh foods.  Eat more vegetables, fruits, and low-fat dairy products.    Choose whole grains. Look for the word "whole" as the first word in the ingredient list.  Choose fish   Limit sweets, desserts, sugars, and sugary drinks.  Choose heart-healthy fats.  Eat veggie cheese   Eat more home-cooked food and less restaurant, buffet, and fast food.  Limit fried foods.  [place]uffaker foods using methods other than frying.  Limit canned vegetables. If you do use them, rinse them well to decrease the sodium.  When eating at a restaurant, ask that your food be prepared with less salt, or no salt if possible.                      WHAT FOODS CAN I EAT?  Read Dr Fara Olden Fuhrman's books on The End of Dieting & The End of Diabetes  Grains  Whole grain or whole wheat bread. Brown rice. Whole grain or whole wheat pasta. Quinoa, bulgur, and whole grain cereals. Low-sodium cereals. Corn or whole wheat flour tortillas. Whole grain cornbread. Whole grain crackers. Low-sodium crackers.  Vegetables  Fresh or frozen vegetables (raw, steamed, roasted, or grilled). Low-sodium or reduced-sodium tomato and vegetable juices. Low-sodium or reduced-sodium tomato sauce and paste. Low-sodium or reduced-sodium canned vegetables.   Fruits  All fresh, canned (in natural juice), or frozen fruits.  Protein Products   All fish and seafood.  Dried beans, peas, or lentils. Unsalted nuts and seeds. Unsalted canned beans.  Dairy  Low-fat dairy products, such as skim or 1% milk, 2% or reduced-fat cheeses, low-fat ricotta or cottage cheese, or plain low-fat yogurt. Low-sodium or reduced-sodium cheeses.  Fats and Oils  Tub margarines without trans fats. Light or  reduced-fat mayonnaise and salad dressings (reduced sodium). Avocado. Safflower, olive, or canola oils. Natural peanut or almond butter.  Other  Unsalted popcorn and pretzels. The items listed above may not be a complete list of recommended foods or beverages. Contact your dietitian for more options.  +++++++++++++++++++++++++++++++++++++++++++  WHAT FOODS ARE NOT RECOMMENDED?  Grains/ White flour or wheat flour  White bread. White pasta. White rice. Refined cornbread. Bagels and croissants. Crackers that contain trans fat.  Vegetables  Creamed or fried vegetables. Vegetables in a . Regular canned vegetables. Regular canned tomato sauce and paste. Regular tomato and vegetable juices.  Fruits  Dried fruits. Canned fruit in light or heavy syrup. Fruit juice.  Meat and Other Protein Products  Meat in general - RED mwaet & White meat.  Fatty cuts of meat. Ribs, chicken wings, bacon, sausage, bologna, salami, chitterlings, fatback, hot dogs, bratwurst, and packaged luncheon meats.  Dairy  Whole or 2% milk, cream, half-and-half, and cream cheese. Whole-fat or sweetened yogurt. Full-fat cheeses or blue cheese. Nondairy creamers and whipped toppings. Processed cheese, cheese spreads, or  cheese curds.  Condiments  Onion and garlic salt, seasoned salt, table salt, and sea salt. Canned and packaged gravies. Worcestershire sauce. Tartar sauce. Barbecue sauce. Teriyaki sauce. Soy sauce, including reduced sodium. Steak sauce. Fish sauce. Oyster sauce. Cocktail sauce. Horseradish. Ketchup and mustard. Meat flavorings and tenderizers. Bouillon cubes. Hot sauce. Tabasco sauce. Marinades. Taco seasonings. Relishes.  Fats and Oils Butter, stick margarine, lard, shortening and bacon fat. Coconut, palm kernel, or palm oils. Regular salad dressings.  Pickles and olives. Salted popcorn and pretzels.  The items listed above may not be a complete list of foods and beverages to avoid.   Preventive  Care for Adults  A healthy lifestyle and preventive care can promote health and wellness. Preventive health guidelines for women include the following key practices.  A routine yearly physical is a good way to check with your health care provider about your health and preventive screening. It is a chance to share any concerns and updates on your health and to receive a thorough exam.  Visit your dentist for a routine exam and preventive care every 6 months. Brush your teeth twice a day and floss once a day. Good oral hygiene prevents tooth decay and gum disease.  The frequency of eye exams is based on your age, health, family medical history, use of contact lenses, and other factors. Follow your health care provider's recommendations for frequency of eye exams.  Eat a healthy diet. Foods like vegetables, fruits, whole grains, low-fat dairy products, and lean protein foods contain the nutrients you need without too many calories. Decrease your intake of foods high in solid fats, added sugars, and salt. Eat the right amount of calories for you.Get information about a proper diet from your health care provider, if necessary.  Regular physical exercise is one of the most important things you can do for your health. Most adults should get at least 150 minutes of moderate-intensity exercise (any activity that increases your heart rate and causes you to sweat) each week. In addition, most adults need muscle-strengthening exercises on 2 or more days a week.  Maintain a healthy weight. The body mass index (BMI) is a screening tool to identify possible weight problems. It provides an estimate of body fat based on height and weight. Your health care provider can find your BMI and can help you achieve or maintain a healthy weight.For adults 20 years and older:  A BMI below 18.5 is considered underweight.  A BMI of 18.5 to 24.9 is normal.  A BMI of 25 to 29.9 is considered overweight.  A BMI of 30 and  above is considered obese.  Maintain normal blood lipids and cholesterol levels by exercising and minimizing your intake of saturated fat. Eat a balanced diet with plenty of fruit and vegetables. Blood tests for lipids and cholesterol should begin at age 21 and be repeated every 5 years. If your lipid or cholesterol levels are high, you are over 50, or you are at high risk for heart disease, you may need your cholesterol levels checked more frequently.Ongoing high lipid and cholesterol levels should be treated with medicines if diet and exercise are not working.  If you smoke, find out from your health care provider how to quit. If you do not use tobacco, do not start.  Lung cancer screening is recommended for adults aged 31-80 years who are at high risk for developing lung cancer because of a history of smoking. A yearly low-dose CT scan of the lungs is recommended  for people who have at least a 30-pack-year history of smoking and are a current smoker or have quit within the past 15 years. A pack year of smoking is smoking an average of 1 pack of cigarettes a day for 1 year (for example: 1 pack a day for 30 years or 2 packs a day for 15 years). Yearly screening should continue until the smoker has stopped smoking for at least 15 years. Yearly screening should be stopped for people who develop a health problem that would prevent them from having lung cancer treatment.  High blood pressure causes heart disease and increases the risk of stroke. Your blood pressure should be checked at least every 1 to 2 years. Ongoing high blood pressure should be treated with medicines if weight loss and exercise do not work.  If you are 57-58 years old, ask your health care provider if you should take aspirin to prevent strokes.  Diabetes screening involves taking a blood sample to check your fasting blood sugar level. This should be done once every 3 years, after age 35, if you are within normal weight and without risk  factors for diabetes. Testing should be considered at a younger age or be carried out more frequently if you are overweight and have at least 1 risk factor for diabetes.  Breast cancer screening is essential preventive care for women. You should practice "breast self-awareness." This means understanding the normal appearance and feel of your breasts and may include breast self-examination. Any changes detected, no matter how small, should be reported to a health care provider. Women in their 60s and 30s should have a clinical breast exam (CBE) by a health care provider as part of a regular health exam every 1 to 3 years. After age 27, women should have a CBE every year. Starting at age 75, women should consider having a mammogram (breast X-ray test) every year. Women who have a family history of breast cancer should talk to their health care provider about genetic screening. Women at a high risk of breast cancer should talk to their health care providers about having an MRI and a mammogram every year.  Breast cancer gene (BRCA)-related cancer risk assessment is recommended for women who have family members with BRCA-related cancers. BRCA-related cancers include breast, ovarian, tubal, and peritoneal cancers. Having family members with these cancers may be associated with an increased risk for harmful changes (mutations) in the breast cancer genes BRCA1 and BRCA2. Results of the assessment will determine the need for genetic counseling and BRCA1 and BRCA2 testing.  Routine pelvic exams to screen for cancer are no longer recommended for nonpregnant women who are considered low risk for cancer of the pelvic organs (ovaries, uterus, and vagina) and who do not have symptoms. Ask your health care provider if a screening pelvic exam is right for you.  If you have had past treatment for cervical cancer or a condition that could lead to cancer, you need Pap tests and screening for cancer for at least 20 years after  your treatment. If Pap tests have been discontinued, your risk factors (such as having a new sexual partner) need to be reassessed to determine if screening should be resumed. Some women have medical problems that increase the chance of getting cervical cancer. In these cases, your health care provider may recommend more frequent screening and Pap tests.  Colorectal cancer can be detected and often prevented. Most routine colorectal cancer screening begins at the age of 75 years and  continues through age 74 years. However, your health care provider may recommend screening at an earlier age if you have risk factors for colon cancer. On a yearly basis, your health care provider may provide home test kits to check for hidden blood in the stool. Use of a small camera at the end of a tube, to directly examine the colon (sigmoidoscopy or colonoscopy), can detect the earliest forms of colorectal cancer. Talk to your health care provider about this at age 22, when routine screening begins. Direct exam of the colon should be repeated every 5-10 years through age 76 years, unless early forms of pre-cancerous polyps or small growths are found.  Hepatitis C blood testing is recommended for all people born from [birth year] through 1966 and any individual with known risks for hepatitis C.  Pra  Osteoporosis is a disease in which the bones lose minerals and strength with aging. This can result in serious bone fractures or breaks. The risk of osteoporosis can be identified using a bone density scan. Women ages 88 years and over and women at risk for fractures or osteoporosis should discuss screening with their health care providers. Ask your health care provider whether you should take a calcium supplement or vitamin D to reduce the rate of osteoporosis.  Menopause can be associated with physical symptoms and risks. Hormone replacement therapy is available to decrease symptoms and risks. You should talk to your health care  provider about whether hormone replacement therapy is right for you.  Use sunscreen. Apply sunscreen liberally and repeatedly throughout the day. You should seek shade when your shadow is shorter than you. Protect yourself by wearing long sleeves, pants, a wide-brimmed hat, and sunglasses year round, whenever you are outdoors.  Once a month, do a whole body skin exam, using a mirror to look at the skin on your back. Tell your health care provider of new moles, moles that have irregular borders, moles that are larger than a pencil eraser, or moles that have changed in shape or color.  Stay current with required vaccines (immunizations).  Influenza vaccine. All adults should be immunized every year.  Tetanus, diphtheria, and acellular pertussis (Td, Tdap) vaccine. Pregnant women should receive 1 dose of Tdap vaccine during each pregnancy. The dose should be obtained regardless of the length of time since the last dose. Immunization is preferred during the 27th-36th week of gestation. An adult who has not previously received Tdap or who does not know her vaccine status should receive 1 dose of Tdap. This initial dose should be followed by tetanus and diphtheria toxoids (Td) booster doses every 10 years. Adults with an unknown or incomplete history of completing a 3-dose immunization series with Td-containing vaccines should begin or complete a primary immunization series including a Tdap dose. Adults should receive a Td booster every 10 years.  Varicella vaccine. An adult without evidence of immunity to varicella should receive 2 doses or a second dose if she has previously received 1 dose. Pregnant females who do not have evidence of immunity should receive the first dose after pregnancy. This first dose should be obtained before leaving the health care facility. The second dose should be obtained 4-8 weeks after the first dose.  Human papillomavirus (HPV) vaccine. Females aged 13-26 years who have not  received the vaccine previously should obtain the 3-dose series. The vaccine is not recommended for use in pregnant females. However, pregnancy testing is not needed before receiving a dose. If a female is found to  be pregnant after receiving a dose, no treatment is needed. In that case, the remaining doses should be delayed until after the pregnancy. Immunization is recommended for any person with an immunocompromised condition through the age of 14 years if she did not get any or all doses earlier. During the 3-dose series, the second dose should be obtained 4-8 weeks after the first dose. The third dose should be obtained 24 weeks after the first dose and 16 weeks after the second dose.  Zoster vaccine. One dose is recommended for adults aged 87 years or older unless certain conditions are present.  Measles, mumps, and rubella (MMR) vaccine. Adults born before [birth year] generally are considered immune to measles and mumps. Adults born in [birth year] or later should have 1 or more doses of MMR vaccine unless there is a contraindication to the vaccine or there is laboratory evidence of immunity to each of the three diseases. A routine second dose of MMR vaccine should be obtained at least 28 days after the first dose for students attending postsecondary schools, health care workers, or international travelers. People who received inactivated measles vaccine or an unknown type of measles vaccine during 1963-1967 should receive 2 doses of MMR vaccine. People who received inactivated mumps vaccine or an unknown type of mumps vaccine before 1979 and are at high risk for mumps infection should consider immunization with 2 doses of MMR vaccine. For females of childbearing age, rubella immunity should be determined. If there is no evidence of immunity, females who are not pregnant should be vaccinated. If there is no evidence of immunity, females who are pregnant should delay immunization until after pregnancy. Unvaccinated  health care workers born before [birth year] who lack laboratory evidence of measles, mumps, or rubella immunity or laboratory confirmation of disease should consider measles and mumps immunization with 2 doses of MMR vaccine or rubella immunization with 1 dose of MMR vaccine.  Pneumococcal 13-valent conjugate (PCV13) vaccine. When indicated, a person who is uncertain of her immunization history and has no record of immunization should receive the PCV13 vaccine. An adult aged 48 years or older who has certain medical conditions and has not been previously immunized should receive 1 dose of PCV13 vaccine. This PCV13 should be followed with a dose of pneumococcal polysaccharide (PPSV23) vaccine. The PPSV23 vaccine dose should be obtained at least 8 weeks after the dose of PCV13 vaccine. An adult aged 40 years or older who has certain medical conditions and previously received 1 or more doses of PPSV23 vaccine should receive 1 dose of PCV13. The PCV13 vaccine dose should be obtained 1 or more years after the last PPSV23 vaccine dose.    Pneumococcal polysaccharide (PPSV23) vaccine. When PCV13 is also indicated, PCV13 should be obtained first. All adults aged 66 years and older should be immunized. An adult younger than age 28 years who has certain medical conditions should be immunized. Any person who resides in a nursing home or long-term care facility should be immunized. An adult smoker should be immunized. People with an immunocompromised condition and certain other conditions should receive both PCV13 and PPSV23 vaccines. People with human immunodeficiency virus (HIV) infection should be immunized as soon as possible after diagnosis. Immunization during chemotherapy or radiation therapy should be avoided. Routine use of PPSV23 vaccine is not recommended for American Indians, [place] Natives, or people younger than 65 years unless there are medical conditions that require PPSV23 vaccine. When indicated, people who  have unknown immunization and have no record of  immunization should receive PPSV23 vaccine. One-time revaccination 5 years after the first dose of PPSV23 is recommended for people aged 19-64 years who have chronic kidney failure, nephrotic syndrome, asplenia, or immunocompromised conditions. People who received 1-2 doses of PPSV23 before age 48 years should receive another dose of PPSV23 vaccine at age 20 years or later if at least 5 years have passed since the previous dose. Doses of PPSV23 are not needed for people immunized with PPSV23 at or after age 49 years.  Preventive Services / Frequency   Ages 88 to 80 years  Blood pressure check.  Lipid and cholesterol check.  Lung cancer screening. / Every year if you are aged 38-80 years and have a 30-pack-year history of smoking and currently smoke or have quit within the past 15 years. Yearly screening is stopped once you have quit smoking for at least 15 years or develop a health problem that would prevent you from having lung cancer treatment.  Clinical breast exam.** / Every year after age 8 years.  BRCA-related cancer risk assessment.** / For women who have family members with a BRCA-related cancer (breast, ovarian, tubal, or peritoneal cancers).  Mammogram.** / Every year beginning at age 27 years and continuing for as long as you are in good health. Consult with your health care provider.  Pap test.** / Every 3 years starting at age 12 years through age 12 or 42 years with a history of 3 consecutive normal Pap tests.  HPV screening.** / Every 3 years from ages 78 years through ages 14 to 22 years with a history of 3 consecutive normal Pap tests.  Fecal occult blood test (FOBT) of stool. / Every year beginning at age 64 years and continuing until age 78 years. You may not need to do this test if you get a colonoscopy every 10 years.  Flexible sigmoidoscopy or colonoscopy.** / Every 5 years for a flexible sigmoidoscopy or every 10 years  for a colonoscopy beginning at age 58 years and continuing until age 59 years.  Hepatitis C blood test.** / For all people born from [birth year] through 1966 and any individual with known risks for hepatitis C.  Skin self-exam. / Monthly.  Influenza vaccine. / Every year.  Tetanus, diphtheria, and acellular pertussis (Tdap/Td) vaccine.** / Consult your health care provider. Pregnant women should receive 1 dose of Tdap vaccine during each pregnancy. 1 dose of Td every 10 years.  Varicella vaccine.** / Consult your health care provider. Pregnant females who do not have evidence of immunity should receive the first dose after pregnancy.  Zoster vaccine.** / 1 dose for adults aged 28 years or older.  Pneumococcal 13-valent conjugate (PCV13) vaccine.** / Consult your health care provider.  Pneumococcal polysaccharide (PPSV23) vaccine.** / 1 to 2 doses if you smoke cigarettes or if you have certain conditions.  Meningococcal vaccine.** / Consult your health care provider.  Hepatitis A vaccine.** / Consult your health care provider.  Hepatitis B vaccine.** / Consult your health care provider. Screening for abdominal aortic aneurysm (AAA)  by ultrasound is recommended for people over 50 who have history of high blood pressure or who are current or former smokers.

## 2015-07-02 LAB — HEMOGLOBIN A1C
Hgb A1c MFr Bld: 5.7 % — ABNORMAL HIGH (ref <5.7)
Mean Plasma Glucose: 117 mg/dL — ABNORMAL HIGH (ref <117)

## 2015-07-02 LAB — URINALYSIS, ROUTINE W REFLEX MICROSCOPIC
Bilirubin Urine: NEGATIVE
Glucose, UA: NEGATIVE
Hgb urine dipstick: NEGATIVE
Ketones, ur: NEGATIVE
Leukocytes, UA: NEGATIVE
NITRITE: NEGATIVE
PROTEIN: NEGATIVE
SPECIFIC GRAVITY, URINE: 1.015 (ref 1.001–1.035)
pH: 5.5 (ref 5.0–8.0)

## 2015-07-02 LAB — MICROALBUMIN / CREATININE URINE RATIO
Creatinine, Urine: 86 mg/dL (ref 20–320)
Microalb Creat Ratio: 16 ug/mg{creat}
Microalb, Ur: 1.4 mg/dL

## 2015-07-02 LAB — TSH: TSH: 2.25 m[IU]/L

## 2015-07-02 LAB — INSULIN, RANDOM: INSULIN: 12.4 u[IU]/mL (ref 2.0–19.6)

## 2015-07-02 LAB — VITAMIN D 25 HYDROXY (VIT D DEFICIENCY, FRACTURES): Vit D, 25-Hydroxy: 20 ng/mL — ABNORMAL LOW (ref 30–100)

## 2015-07-02 LAB — VITAMIN B12: VITAMIN B 12: 534 pg/mL (ref 200–1100)

## 2015-07-03 ENCOUNTER — Ambulatory Visit
Admission: RE | Admit: 2015-07-03 | Discharge: 2015-07-03 | Disposition: A | Discharge status: Home / Self Care | Payer: 59 | Source: Ambulatory Visit

## 2015-07-03 DIAGNOSIS — Z1231 Encounter for screening mammogram for malignant neoplasm of breast: Secondary | ICD-10-CM

## 2015-07-21 ENCOUNTER — Other Ambulatory Visit: Payer: Self-pay | Admitting: *Deleted

## 2015-07-21 DIAGNOSIS — Z1212 Encounter for screening for malignant neoplasm of rectum: Secondary | ICD-10-CM

## 2015-07-21 LAB — POC HEMOCCULT BLD/STL (HOME/3-CARD/SCREEN)
Card #2 Fecal Occult Blod, POC: NEGATIVE
Card #3 Fecal Occult Blood, POC: NEGATIVE
Fecal Occult Blood, POC: NEGATIVE

## 2015-08-14 ENCOUNTER — Other Ambulatory Visit: Payer: Self-pay | Admitting: Internal Medicine

## 2015-10-02 ENCOUNTER — Encounter: Payer: Self-pay | Admitting: Internal Medicine

## 2015-10-02 ENCOUNTER — Ambulatory Visit (INDEPENDENT_AMBULATORY_CARE_PROVIDER_SITE_OTHER): Payer: 59 | Admitting: Internal Medicine

## 2015-10-02 VITALS — BP 130/84 | HR 88 | Temp 98.2°F | Resp 18 | Ht 67.0 in | Wt 341.0 lb

## 2015-10-02 DIAGNOSIS — M792 Neuralgia and neuritis, unspecified: Secondary | ICD-10-CM

## 2015-10-02 DIAGNOSIS — Z79899 Other long term (current) drug therapy: Secondary | ICD-10-CM

## 2015-10-02 DIAGNOSIS — E559 Vitamin D deficiency, unspecified: Secondary | ICD-10-CM | POA: Diagnosis not present

## 2015-10-02 DIAGNOSIS — E785 Hyperlipidemia, unspecified: Secondary | ICD-10-CM | POA: Diagnosis not present

## 2015-10-02 DIAGNOSIS — M5412 Radiculopathy, cervical region: Secondary | ICD-10-CM

## 2015-10-02 DIAGNOSIS — I10 Essential (primary) hypertension: Secondary | ICD-10-CM

## 2015-10-02 DIAGNOSIS — R7309 Other abnormal glucose: Secondary | ICD-10-CM

## 2015-10-02 LAB — BASIC METABOLIC PANEL WITH GFR
BUN: 13 mg/dL (ref 7–25)
CALCIUM: 9.2 mg/dL (ref 8.6–10.4)
CO2: 27 mmol/L (ref 20–31)
CREATININE: 0.74 mg/dL (ref 0.50–0.99)
Chloride: 103 mmol/L (ref 98–110)
GFR, EST NON AFRICAN AMERICAN: 88 mL/min (ref >=60)
Glucose, Bld: 93 mg/dL (ref 65–99)
Potassium: 4.4 mmol/L (ref 3.5–5.3)
SODIUM: 140 mmol/L (ref 135–146)

## 2015-10-02 LAB — HEPATIC FUNCTION PANEL
ALT: 16 U/L (ref 6–29)
AST: 18 U/L (ref 10–35)
Albumin: 4.1 g/dL (ref 3.6–5.1)
Alkaline Phosphatase: 102 U/L (ref 33–130)
BILIRUBIN DIRECT: 0.2 mg/dL (ref <=0.2)
BILIRUBIN INDIRECT: 0.6 mg/dL (ref 0.2–1.2)
TOTAL PROTEIN: 7.4 g/dL (ref 6.1–8.1)
Total Bilirubin: 0.8 mg/dL (ref 0.2–1.2)

## 2015-10-02 LAB — CBC WITH DIFFERENTIAL/PLATELET
Basophils Absolute: 0 cells/uL (ref 0–200)
Basophils Relative: 0 %
EOS PCT: 2 %
Eosinophils Absolute: 116 cells/uL (ref 15–500)
HCT: 37.9 % (ref 35.0–45.0)
Hemoglobin: 12.4 g/dL (ref 11.7–15.5)
LYMPHS PCT: 35 %
Lymphs Abs: 2030 cells/uL (ref 850–3900)
MCH: 28.1 pg (ref 27.0–33.0)
MCHC: 32.7 g/dL (ref 32.0–36.0)
MCV: 85.7 fL (ref 80.0–100.0)
MONOS PCT: 7 %
MPV: 9.4 fL (ref 7.5–12.5)
Monocytes Absolute: 406 cells/uL (ref 200–950)
NEUTROS PCT: 56 %
Neutro Abs: 3248 cells/uL (ref 1500–7800)
Platelets: 221 10*3/uL (ref 140–400)
RBC: 4.42 MIL/uL (ref 3.80–5.10)
RDW: 14.3 % (ref 11.0–15.0)
WBC: 5.8 10*3/uL (ref 3.8–10.8)

## 2015-10-02 LAB — LIPID PANEL
CHOLESTEROL: 169 mg/dL (ref 125–200)
HDL: 57 mg/dL (ref >=46)
LDL CALC: 96 mg/dL (ref <130)
TRIGLYCERIDES: 79 mg/dL (ref <150)
Total CHOL/HDL Ratio: 3 Ratio (ref <=5.0)
VLDL: 16 mg/dL (ref <30)

## 2015-10-02 LAB — TSH: TSH: 2.49 mIU/L

## 2015-10-02 LAB — HEMOGLOBIN A1C
Hgb A1c MFr Bld: 5.6 % (ref <5.7)
Mean Plasma Glucose: 114 mg/dL

## 2015-10-02 MED ORDER — MELOXICAM 15 MG PO TABS: 15.0000 mg | ORAL_TABLET | Freq: Every day | ORAL | Status: DC

## 2015-10-02 NOTE — Patient Instructions (Signed)
Meloxicam capsules What is this medicine? MELOXICAM (mel OX i cam) is a non-steroidal anti-inflammatory drug (NSAID). It is used to reduce swelling and to treat pain. It is used for osteoarthritis. This medicine may be used for other purposes; ask your health care provider or pharmacist if you have questions. What should I tell my health care provider before I take this medicine? They need to know if you have any of these conditions: -bleeding disorders -cigarette smoker -coronary artery bypass graft (CABG) surgery within the past 2 weeks -drink more than 3 alcohol-containing drinks per day -heart disease -high blood pressure -history of stomach bleeding -kidney disease -liver disease -lung or breathing disease, like asthma -stomach or intestine problems -an unusual or allergic reaction to meloxicam, aspirin, other NSAIDs, other medicines, foods, dyes, or preservatives -pregnant or trying to get pregnant -breast-feeding How should I use this medicine? Take this medicine by mouth with a full glass of water. Follow the directions on the prescription label. You can take it with or without food. If it upsets your stomach, take it with food. Take your medicine at regular intervals. Do not take it more often than directed. Do not stop taking except on your doctor's advice. A special MedGuide will be given to you by the pharmacist with each prescription and refill. Be sure to read this information carefully each time. Talk to your pediatrician regarding the use of this medicine in children. Special care may be needed. Patients over 61 years old may have a stronger reaction and need a smaller dose. Overdosage: If you think you have taken too much of this medicine contact a poison control center or emergency room at once. NOTE: This medicine is only for you. Do not share this medicine with others. What if I miss a dose? If you miss a dose, take it as soon as you can. If it is almost time for your  next dose, take only that dose. Do not take double or extra doses. What may interact with this medicine? Do not take this medicine with any of the following medications: -cidofovir -ketorolac This medicine may also interact with the following medications: -aspirin and aspirin-like medicines -certain medicines for blood pressure, heart disease, irregular heart beat -certain medicines for depression, anxiety, or psychotic disturbances -certain medicines that treat or prevent blood clots like warfarin, enoxaparin, dalteparin, apixaban, dabigatran, rivaroxaban -cyclosporine -digoxin -diuretics -methotrexate -other NSAIDs, medicines for pain and inflammation, like ibuprofen and naproxen -pemetrexed This list may not describe all possible interactions. Give your health care provider a list of all the medicines, herbs, non-prescription drugs, or dietary supplements you use. Also tell them if you smoke, drink alcohol, or use illegal drugs. Some items may interact with your medicine. What should I watch for while using this medicine? Tell your doctor or healthcare professional if your symptoms do not start to get better or if they get worse. Do not take other medicines that contain aspirin, ibuprofen, or naproxen with this medicine. Side effects such as stomach upset, nausea, or ulcers may be more likely to occur. Many medicines available without a prescription should not be taken with this medicine. This medicine can cause ulcers and bleeding in the stomach and intestines at any time during treatment. This can happen with no warning and may cause death. There is increased risk with taking this medicine for a long time. Smoking, drinking alcohol, older age, and poor health can also increase risks. Call your doctor right away if you have stomach pain or  blood in your vomit or stool. This medicine does not prevent heart attack or stroke. In fact, this medicine may increase the chance of a heart attack or  stroke. The chance may increase with longer use of this medicine and in people who have heart disease. If you take aspirin to prevent heart attack or stroke, talk with your doctor or health care professional. What side effects may I notice from receiving this medicine? Side effects that you should report to your doctor or health care professional as soon as possible: -allergic reactions like skin rash, itching or hives, swelling of the face, lips, or tongue -nausea, vomiting -signs and symptoms of a blood clot such as breathing problems; changes in vision; chest pain; severe, sudden headache; pain, swelling, warmth in the leg; trouble speaking; sudden numbness or weakness of the face, arm, or leg -signs and symptoms of bleeding such as bloody or black, tarry stools; red or dark-brown urine; spitting up blood or brown material that looks like coffee grounds; red spots on the skin; unusual bruising or bleeding from the eye, gums, or nose -signs and symptoms of liver injury like dark yellow or brown urine; general ill feeling or flu-like symptoms; light-colored stools; loss of appetite; nausea; right upper belly pain; unusually weak or tired; yellowing of the eyes or skin -signs and symptoms of stroke like changes in vision; confusion; trouble speaking or understanding; severe headaches; sudden numbness or weakness of the face, arm, or leg; trouble walking; dizziness; loss of balance or coordination Side effects that usually do not require medical attention (report these to your doctor or health care professional if they continue or are bothersome): -constipation -diarrhea -gas This list may not describe all possible side effects. Call your doctor for medical advice about side effects. You may report side effects to FDA at 1-800-FDA-1088. Where should I keep my medicine? Keep out of the reach of children. Store at room temperature between 15 and 30 degrees C (59 and 86 degrees F). Throw away any unused  medicine after the expiration date. NOTE: This sheet is a summary. It may not cover all possible information. If you have questions about this medicine, talk to your doctor, pharmacist, or health care provider.    2016, [employer]vier/Gold Standard. (2014-05-18 00:44:17)

## 2015-10-02 NOTE — Progress Notes (Signed)
Assessment and Plan:  Hypertension:  -Continue medication,  -monitor blood pressure at home.  -Continue DASH diet.   -Reminder to go to the ER if any CP, SOB, nausea, dizziness, severe HA, changes vision/speech, left arm numbness and tingling, and jaw pain.  Cholesterol: -Continue diet and exercise.  -Check cholesterol.   Pre-diabetes: -recommended looking into water aerobics for exercise -Continue diet and exercise.  -Check A1C  Vitamin D Def: -check level -continue medications.   Cervical radiculopathy -try mobic -already on gabapentin 600 -cervical xray -recommended speaking with Dr. Mina Marble  Continue diet and meds as discussed. Further disposition pending results of labs.  HPI 60 y.o. female  presents for 3 month follow up with hypertension, hyperlipidemia, prediabetes and vitamin D.   Her blood pressure has been controlled at home, today their BP is BP: 130/84 mmHg.   She does not workout. She denies chest pain, shortness of breath, dizziness.   She is on cholesterol medication and denies myalgias. Her cholesterol is at goal. The cholesterol last visit was:   Lab Results  Component Value Date   CHOL 202* 07/01/2015   HDL 71 07/01/2015   LDLCALC 111 07/01/2015   TRIG 99 07/01/2015   CHOLHDL 2.8 07/01/2015     She has been working on diet and exercise for prediabetes, and denies foot ulcerations, hyperglycemia, hypoglycemia , increased appetite, nausea, paresthesia of the feet, polydipsia, polyuria, visual disturbances, vomiting and weight loss. Last A1C in the office was:  Lab Results  Component Value Date   HGBA1C 5.7* 07/01/2015    Patient is on Vitamin D supplement.  Lab Results  Component Value Date   VD25OH 20* 07/01/2015     She does report that she has been having some tingling down her right arm that is intermittent and is present more often than not.  No injury.  No previous neck problems.  She reports that the numbness does improve a little bit with  shaking it.  She is currently on gabapentin 600 mg.  She does follow with Dr. Mina Marble at [place].    No issues with gout recently.     Current Medications:  Current Outpatient Prescriptions on File Prior to Visit  Medication Sig Dispense Refill  . allopurinol (ZYLOPRIM) 300 MG tablet TAKE 1 TABLET (300 MG TOTAL) BY MOUTH DAILY. 90 tablet 1  . aspirin 81 MG tablet Take 81 mg by mouth daily.    . bumetanide (BUMEX) 1 MG tablet Take 1 tablet (1 mg total) by mouth as needed. 30 tablet 4  . doxazosin (CARDURA) 4 MG tablet TAKE 1 TO 2 TABLETS BY MOUTH AT BEDTIME FOR BLOOD PRESSURE 180 tablet 1  . losartan (COZAAR) 100 MG tablet Take 1 tablet (100 mg total) by mouth daily. 30 tablet 11  . naproxen sodium (ANAPROX) 220 MG tablet Take 220 mg by mouth daily as needed.    . traMADol (ULTRAM) 50 MG tablet Take 1 tablet 4 x day only if needed for pain 120 tablet 2  . valsartan (DIOVAN) 320 MG tablet Take 1 tablet (320 mg total) by mouth daily. 30 tablet 3   No current facility-administered medications on file prior to visit.    Medical History:  Past Medical History  Diagnosis Date  . Obesity   . Anemia   . Hyperlipidemia   . Hypertension   . Sickle cell trait ([place])   . Elevated hemoglobin A1c   . DJD (degenerative joint disease)   . DDD (degenerative disc disease)   .  GERD (gastroesophageal reflux disease)     Allergies:  Allergies  Allergen Reactions  . Ace Inhibitors     Cough  . Paxil [Paroxetine Hcl]     Mood swings  . Prednisone     Nausea/ irratated  . Vitamin D Analogs     Cramping     Review of Systems:  Review of Systems  Constitutional: Negative for fever, chills and malaise/fatigue.  HENT: Negative for congestion, ear pain and sore throat.   Eyes: Negative.   Respiratory: Negative for cough, shortness of breath and wheezing.   Cardiovascular: Negative for chest pain, palpitations and leg swelling.  Gastrointestinal: Negative for heartburn, abdominal pain,  diarrhea, constipation, blood in stool and melena.  Genitourinary: Negative.   Skin: Negative.   Neurological: Negative for dizziness, sensory change, loss of consciousness and headaches.  Psychiatric/Behavioral: Negative for depression. The patient is not nervous/anxious and does not have insomnia.     Family history- Review and unchanged  Social history- Review and unchanged  Physical Exam: BP 130/84 mmHg  Pulse 88  Temp(Src) 98.2 F (36.8 C) (Temporal)  Resp 18  Ht 5\' 7"  (1.702 m)  Wt 341 lb (154.677 kg)  BMI 53.40 kg/m2 Wt Readings from Last 3 Encounters:  10/02/15 341 lb (154.677 kg)  07/01/15 333 lb (151.048 kg)  04/11/15 338 lb (153.316 kg)    General Appearance: Well nourished well developed, in no apparent distress. Eyes: PERRLA, EOMs, conjunctiva no swelling or erythema ENT/Mouth: Ear canals normal without obstruction, swelling, erythma, discharge.  TMs normal bilaterally.  Oropharynx moist, clear, without exudate, or postoropharyngeal swelling. Neck: Supple, thyroid normal,no cervical adenopathy  Respiratory: Respiratory effort normal, Breath sounds clear A&P without rhonchi, wheeze, or rale.  No retractions, no accessory usage. Cardio: RRR with no MRGs. Brisk peripheral pulses without edema.  Abdomen: Soft, + BS,  Non tender, no guarding, rebound, hernias, masses. Musculoskeletal: Full ROM, 5/5 strength, Normal gait, Normal ROM of the neck, no tenderness to palpation, no strength deficits to the RUE, mild sensory deficit to light touch to the RUE per patient.  Does not follow dermatomal pattern Skin: Warm, dry without rashes, lesions, ecchymosis.  Neuro: Awake and oriented X 3, Cranial nerves intact. Normal muscle tone, no cerebellar symptoms. Psych: Normal affect, Insight and Judgment appropriate.    Starlyn Skeans, PA-C 11:14 AM [facility]

## 2016-01-08 ENCOUNTER — Other Ambulatory Visit: Payer: Self-pay | Admitting: *Deleted

## 2016-01-08 ENCOUNTER — Encounter: Payer: Self-pay | Admitting: Internal Medicine

## 2016-01-08 ENCOUNTER — Ambulatory Visit (INDEPENDENT_AMBULATORY_CARE_PROVIDER_SITE_OTHER): Payer: 59 | Admitting: Internal Medicine

## 2016-01-08 VITALS — BP 136/80 | HR 74 | Temp 97.6°F | Resp 16 | Ht 67.0 in | Wt 343.6 lb

## 2016-01-08 DIAGNOSIS — R7309 Other abnormal glucose: Secondary | ICD-10-CM

## 2016-01-08 DIAGNOSIS — E559 Vitamin D deficiency, unspecified: Secondary | ICD-10-CM | POA: Diagnosis not present

## 2016-01-08 DIAGNOSIS — M150 Primary generalized (osteo)arthritis: Principal | ICD-10-CM

## 2016-01-08 DIAGNOSIS — I10 Essential (primary) hypertension: Secondary | ICD-10-CM | POA: Diagnosis not present

## 2016-01-08 DIAGNOSIS — M1000 Idiopathic gout, unspecified site: Secondary | ICD-10-CM | POA: Diagnosis not present

## 2016-01-08 DIAGNOSIS — Z79899 Other long term (current) drug therapy: Secondary | ICD-10-CM | POA: Diagnosis not present

## 2016-01-08 DIAGNOSIS — M159 Polyosteoarthritis, unspecified: Secondary | ICD-10-CM

## 2016-01-08 DIAGNOSIS — E785 Hyperlipidemia, unspecified: Secondary | ICD-10-CM | POA: Diagnosis not present

## 2016-01-08 DIAGNOSIS — M8949 Other hypertrophic osteoarthropathy, multiple sites: Secondary | ICD-10-CM

## 2016-01-08 LAB — TSH: TSH: 1.83 mIU/L

## 2016-01-08 LAB — HEPATIC FUNCTION PANEL
ALT: 17 U/L (ref 6–29)
AST: 16 U/L (ref 10–35)
Albumin: 3.9 g/dL (ref 3.6–5.1)
Alkaline Phosphatase: 98 U/L (ref 33–130)
BILIRUBIN DIRECT: 0.2 mg/dL (ref <=0.2)
BILIRUBIN INDIRECT: 0.8 mg/dL (ref 0.2–1.2)
BILIRUBIN TOTAL: 1 mg/dL (ref 0.2–1.2)
Total Protein: 7.5 g/dL (ref 6.1–8.1)

## 2016-01-08 LAB — HEMOGLOBIN A1C
Hgb A1c MFr Bld: 5.5 % (ref <5.7)
Mean Plasma Glucose: 111 mg/dL

## 2016-01-08 LAB — CBC WITH DIFFERENTIAL/PLATELET
BASOS ABS: 0 {cells}/uL (ref 0–200)
Basophils Relative: 0 %
EOS PCT: 3 %
Eosinophils Absolute: 183 cells/uL (ref 15–500)
HCT: 36.9 % (ref 35.0–45.0)
HEMOGLOBIN: 12.1 g/dL (ref 11.7–15.5)
LYMPHS PCT: 34 %
Lymphs Abs: 2074 cells/uL (ref 850–3900)
MCH: 27.7 pg (ref 27.0–33.0)
MCHC: 32.8 g/dL (ref 32.0–36.0)
MCV: 84.4 fL (ref 80.0–100.0)
MONOS PCT: 7 %
MPV: 9.6 fL (ref 7.5–12.5)
Monocytes Absolute: 427 cells/uL (ref 200–950)
NEUTROS PCT: 56 %
Neutro Abs: 3416 cells/uL (ref 1500–7800)
Platelets: 246 10*3/uL (ref 140–400)
RBC: 4.37 MIL/uL (ref 3.80–5.10)
RDW: 14 % (ref 11.0–15.0)
WBC: 6.1 10*3/uL (ref 3.8–10.8)

## 2016-01-08 LAB — BASIC METABOLIC PANEL WITH GFR
BUN: 12 mg/dL (ref 7–25)
CALCIUM: 9.4 mg/dL (ref 8.6–10.4)
CO2: 24 mmol/L (ref 20–31)
CREATININE: 0.78 mg/dL (ref 0.50–0.99)
Chloride: 100 mmol/L (ref 98–110)
GFR, Est Non African American: 82 mL/min (ref >=60)
Glucose, Bld: 144 mg/dL — ABNORMAL HIGH (ref 65–99)
Potassium: 4.3 mmol/L (ref 3.5–5.3)
SODIUM: 138 mmol/L (ref 135–146)

## 2016-01-08 LAB — LIPID PANEL
CHOL/HDL RATIO: 2.9 ratio (ref <=5.0)
CHOLESTEROL: 166 mg/dL (ref 125–200)
HDL: 58 mg/dL (ref >=46)
LDL Cholesterol: 87 mg/dL (ref <130)
TRIGLYCERIDES: 106 mg/dL (ref <150)
VLDL: 21 mg/dL (ref <30)

## 2016-01-08 LAB — MAGNESIUM: Magnesium: 1.9 mg/dL (ref 1.5–2.5)

## 2016-01-08 MED ORDER — TRAMADOL HCL 50 MG PO TABS: ORAL_TABLET | ORAL | 2 refills | Status: DC

## 2016-01-08 NOTE — Progress Notes (Signed)
[facility]                       Unk Pinto, M.D.        Uvaldo Bristle. Silverio Lay, P.A.-C       Starlyn Skeans, P.A.-C   [facility]                [address] [place] [id number] Telephone [phone] Telefax [phone] ______________________________________________________________________     This very nice 60y.o.SBF presents for 6 month follow up with Hypertension, Hyperlipidemia, Pre-Diabetes and Vitamin D Deficiency. Patient also has hx/o Gout apparently controlled w/o any recent flares of sx's.      Patient is treated for HTN circa 1982 & BP has been controlled at home. Today's BP: 136/80. Patient has had no complaints of any cardiac type chest pain, palpitations, dyspnea/orthopnea/PND, dizziness, claudication, or dependent edema.     Hyperlipidemia is controlled with diet & meds. Patient denies myalgias or other med SE's. Last Lipids were at goal: Lab Results  Component Value Date   CHOL 169 10/02/2015   HDL 57 10/02/2015   LDLCALC 96 10/02/2015   TRIG 79 10/02/2015   CHOLHDL 3.0 10/02/2015      Also, the patient has history of Morbid Obesity (BMI 53+) and consequent PreDiabetes in 2011 with A1c 5.9% and has had no symptoms of reactive hypoglycemia, diabetic polys, paresthesias or visual blurring.  Last A1c was back in the normal non-Diabetic range but note patient has gained 10# over the last 6 months and back up to 343# with BMI 53.82.   Lab Results  Component Value Date   HGBA1C 5.6 10/02/2015      Further, the patient also has history of Vitamin D Deficiency of "18" in 2008 and she does not supplement  vitamin D as recommended numerous times in the past.. Last vitamin D was still very low.  Lab Results  Component Value Date   VD25OH 20 (L) 07/01/2015   Current Outpatient Prescriptions on File Prior to Visit  Medication Sig  . allopurinol (ZYLOPRIM) 300 MG tablet TAKE 1  TABLET (300 MG TOTAL) BY MOUTH DAILY.  Marland Kitchen aspirin 81 MG tablet Take 81 mg by mouth daily.  . bumetanide (BUMEX) 1 MG tablet Take 1 tablet (1 mg total) by mouth as needed.  . doxazosin (CARDURA) 4 MG tablet TAKE 1 TO 2 TABLETS BY MOUTH AT BEDTIME FOR BLOOD PRESSURE  . gabapentin (NEURONTIN) 600 MG tablet Take 600 mg by mouth 3 (three) times daily.  Marland Kitchen losartan (COZAAR) 100 MG tablet Take 1 tablet (100 mg total) by mouth daily.  . traMADol (ULTRAM) 50 MG tablet Take 1 tablet 4 x day only if needed for pain   Allergies  Allergen Reactions  . Ace Inhibitors     Cough  . Paxil [Paroxetine Hcl]     Mood swings  . Prednisone     Nausea/ irratated  . Vitamin D Analogs     Cramping   PMHx:   Past Medical History:  Diagnosis Date  . Anemia   . DDD (degenerative disc disease)   . DJD (degenerative joint disease)   . Elevated hemoglobin A1c   . GERD (gastroesophageal reflux disease)   . Hyperlipidemia   . Hypertension   . Obesity   . Sickle cell trait ([place])    Immunization History  Administered Date(s) Administered  . PPD Test 05/02/2013, 06/18/2014  . Td 07/01/2015   Past Surgical History:  Procedure Laterality Date  . SHOULDER SURGERY Left 2007   FHx:    Reviewed / unchanged  SHx:    Reviewed / unchanged  Systems Review:  Constitutional: Denies fever, chills, wt changes, headaches, insomnia, fatigue, night sweats, change in appetite. Eyes: Denies redness, blurred vision, diplopia, discharge, itchy, watery eyes.  ENT: Denies discharge, congestion, post nasal drip, epistaxis, sore throat, earache, hearing loss, dental pain, tinnitus, vertigo, sinus pain, snoring.  CV: Denies chest pain, palpitations, irregular heartbeat, syncope, dyspnea, diaphoresis, orthopnea, PND, claudication or edema. Respiratory: denies cough, dyspnea, DOE, pleurisy, hoarseness, laryngitis, wheezing.  Gastrointestinal: Denies dysphagia, odynophagia, heartburn, reflux, water brash, abdominal pain or cramps,  nausea, vomiting, bloating, diarrhea, constipation, hematemesis, melena, hematochezia  or hemorrhoids. Genitourinary: Denies dysuria, frequency, urgency, nocturia, hesitancy, discharge, hematuria or flank pain. Musculoskeletal: Denies arthralgias, myalgias, stiffness, jt. swelling, pain, limping or strain/sprain.  Skin: Denies pruritus, rash, hives, warts, acne, eczema or change in skin lesion(s). Neuro: No weakness, tremor, incoordination, spasms, paresthesia or pain. Psychiatric: Denies confusion, memory loss or sensory loss. Endo: Denies change in weight, skin or hair change.  Heme/Lymph: No excessive bleeding, bruising or enlarged lymph nodes.  Physical Exam  BP 136/80   Pulse 74   Temp 97.6 F (36.4 C)   Resp 16   Ht 5\' 7"  (1.702 m)   Wt (!) 343 lb 9.6 oz (155.9 kg)   SpO2 96%   BMI 53.82 kg/m   Appears overnourished and in no distress.  Eyes: PERRLA, EOMs, conjunctiva no swelling or erythema. Sinuses: No frontal/maxillary tenderness ENT/Mouth: EAC's clear, TM's nl w/o erythema, bulging. Nares clear w/o erythema, swelling, exudates. Oropharynx clear without erythema or exudates. Oral hygiene is good. Tongue normal, non obstructing. Hearing intact.  Neck: Supple. Thyroid nl. Car 2+/2+ without bruits, nodes or JVD. Chest: Respirations nl with BS clear & equal w/o rales, rhonchi, wheezing or stridor.  Cor: Heart sounds normal w/ regular rate and rhythm without sig. murmurs, gallops, clicks, or rubs. Peripheral pulses normal and equal  without edema.  Abdomen: Soft & bowel sounds normal. Non-tender w/o guarding, rebound, hernias, masses, or organomegaly.  Lymphatics: Unremarkable.  Musculoskeletal: Full ROM all peripheral extremities, joint stability, 5/5 strength, and normal gait.  Skin: Warm, dry without exposed rashes, lesions or ecchymosis apparent.  Neuro: Cranial nerves intact, reflexes equal bilaterally. Sensory-motor testing grossly intact. Tendon reflexes grossly intact.   Pysch: Alert & oriented x 3.  Insight and judgement nl & appropriate. No ideations.  Assessment and Plan:  1. Essential hypertension  - Continue medication, monitor blood pressure at home. Continue DASH diet. Reminder to go to the ER if any CP, SOB, nausea, dizziness, severe HA, changes vision/speech, left arm numbness and tingling and jaw pain.  - TSH  2. Hyperlipidemia  - Continue diet/meds, exercise,& lifestyle modifications. Continue monitor periodic cholesterol/liver & renal functions   - Lipid panel - TSH  3. PreDiabetes  - Continue diet, exercise, lifestyle modifications. Monitor appropriate labs. - Hemoglobin A1c - Insulin, random  4. Vitamin D deficiency  - Continue supplementation. - VITAMIN D 25 Hydroxy   5. Idiopathic gout   6. Morbid obesity due to excess calories ([place])   7. Medication management  - CBC with Differential/Platelet - BASIC METABOLIC PANEL WITH GFR - Hepatic function panel - Magnesium    Recommended regular exercise, BP monitoring, weight control, and discussed med and SE's. Recommended labs  to assess and monitor clinical status. Further disposition pending results of labs. Over 30 minutes of exam, counseling, chart review was performed

## 2016-01-08 NOTE — Patient Instructions (Signed)

## 2016-01-09 LAB — VITAMIN D 25 HYDROXY (VIT D DEFICIENCY, FRACTURES): Vit D, 25-Hydroxy: 18 ng/mL — ABNORMAL LOW (ref 30–100)

## 2016-01-09 LAB — INSULIN, RANDOM: Insulin: 38.4 u[IU]/mL — ABNORMAL HIGH (ref 2.0–19.6)

## 2016-02-28 ENCOUNTER — Other Ambulatory Visit: Payer: Self-pay | Admitting: Internal Medicine

## 2016-04-14 ENCOUNTER — Ambulatory Visit (INDEPENDENT_AMBULATORY_CARE_PROVIDER_SITE_OTHER): Payer: 59 | Admitting: Physician Assistant

## 2016-04-14 VITALS — BP 138/80 | HR 84 | Temp 97.7°F | Ht 66.0 in | Wt 341.0 lb

## 2016-04-14 DIAGNOSIS — Z79899 Other long term (current) drug therapy: Secondary | ICD-10-CM | POA: Diagnosis not present

## 2016-04-14 DIAGNOSIS — M5441 Lumbago with sciatica, right side: Secondary | ICD-10-CM | POA: Diagnosis not present

## 2016-04-14 DIAGNOSIS — G8929 Other chronic pain: Secondary | ICD-10-CM

## 2016-04-14 DIAGNOSIS — I10 Essential (primary) hypertension: Secondary | ICD-10-CM

## 2016-04-14 DIAGNOSIS — E785 Hyperlipidemia, unspecified: Secondary | ICD-10-CM

## 2016-04-14 DIAGNOSIS — E559 Vitamin D deficiency, unspecified: Secondary | ICD-10-CM

## 2016-04-14 DIAGNOSIS — R7309 Other abnormal glucose: Secondary | ICD-10-CM

## 2016-04-14 LAB — HEMOGLOBIN A1C
HEMOGLOBIN A1C: 5.3 % (ref <5.7)
MEAN PLASMA GLUCOSE: 105 mg/dL

## 2016-04-14 LAB — LIPID PANEL
CHOL/HDL RATIO: 2.8 ratio (ref <5.0)
Cholesterol: 159 mg/dL (ref <200)
HDL: 57 mg/dL (ref >50)
LDL CALC: 88 mg/dL (ref <100)
Triglycerides: 70 mg/dL (ref <150)
VLDL: 14 mg/dL (ref <30)

## 2016-04-14 LAB — CBC WITH DIFFERENTIAL/PLATELET
BASOS ABS: 0 {cells}/uL (ref 0–200)
Basophils Relative: 0 %
EOS PCT: 2 %
Eosinophils Absolute: 116 cells/uL (ref 15–500)
HCT: 38 % (ref 35.0–45.0)
HEMOGLOBIN: 12 g/dL (ref 11.7–15.5)
LYMPHS ABS: 2030 {cells}/uL (ref 850–3900)
Lymphocytes Relative: 35 %
MCH: 27.5 pg (ref 27.0–33.0)
MCHC: 31.6 g/dL — ABNORMAL LOW (ref 32.0–36.0)
MCV: 87.2 fL (ref 80.0–100.0)
MONO ABS: 406 {cells}/uL (ref 200–950)
MPV: 9.4 fL (ref 7.5–12.5)
Monocytes Relative: 7 %
NEUTROS ABS: 3248 {cells}/uL (ref 1500–7800)
Neutrophils Relative %: 56 %
Platelets: 234 10*3/uL (ref 140–400)
RBC: 4.36 MIL/uL (ref 3.80–5.10)
RDW: 13.7 % (ref 11.0–15.0)
WBC: 5.8 10*3/uL (ref 3.8–10.8)

## 2016-04-14 LAB — HEPATIC FUNCTION PANEL
ALK PHOS: 112 U/L (ref 33–130)
ALT: 18 U/L (ref 6–29)
AST: 19 U/L (ref 10–35)
Albumin: 3.9 g/dL (ref 3.6–5.1)
BILIRUBIN DIRECT: 0.2 mg/dL (ref <=0.2)
BILIRUBIN INDIRECT: 0.6 mg/dL (ref 0.2–1.2)
TOTAL PROTEIN: 7.5 g/dL (ref 6.1–8.1)
Total Bilirubin: 0.8 mg/dL (ref 0.2–1.2)

## 2016-04-14 LAB — TSH: TSH: 2.22 mIU/L

## 2016-04-14 LAB — BASIC METABOLIC PANEL WITH GFR
BUN: 14 mg/dL (ref 7–25)
CHLORIDE: 103 mmol/L (ref 98–110)
CO2: 27 mmol/L (ref 20–31)
Calcium: 8.8 mg/dL (ref 8.6–10.4)
Creat: 0.72 mg/dL (ref 0.50–0.99)
GFR, Est African American: >89 mL/min (ref >=60)
Glucose, Bld: 101 mg/dL — ABNORMAL HIGH (ref 65–99)
Potassium: 4.4 mmol/L (ref 3.5–5.3)
SODIUM: 139 mmol/L (ref 135–146)

## 2016-04-14 LAB — MAGNESIUM: MAGNESIUM: 2 mg/dL (ref 1.5–2.5)

## 2016-04-14 NOTE — Progress Notes (Signed)
Assessment and Plan:  Hypertension:  -Continue medication,  -monitor blood pressure at home.  -Continue DASH diet.   -Reminder to go to the ER if any CP, SOB, nausea, dizziness, severe HA, changes vision/speech, left arm numbness and tingling, and jaw pain.  Cholesterol: -Continue diet and exercise.  -Check cholesterol.   Pre-diabetes: -recommended looking into water aerobics for exercise -Continue diet and exercise.  -Check A1C  Vitamin D Def: -check level -continue medications.   Lumbar radiculopathy - continue aleve/tramadol - stop gabapentin, not helping, will try topamax for weight loss and radicular pain - suggest following up with Dr. Mina Marble - suggest weight loss  Morbid Obesity with co morbidities - long discussion about weight loss, diet, and exercise  Continue diet and meds as discussed. Further disposition pending results of labs.  HPI 60 y.o. female  presents for 3 month follow up with hypertension, hyperlipidemia, prediabetes and vitamin D.   Her blood pressure has been controlled at home, today their BP is BP: 138/80.   She does not workout. She denies chest pain, shortness of breath, dizziness.   She is on cholesterol medication and denies myalgias. Her cholesterol is at goal. The cholesterol last visit was:   Lab Results  Component Value Date   CHOL 166 01/08/2016   HDL 58 01/08/2016   LDLCALC 87 01/08/2016   TRIG 106 01/08/2016   CHOLHDL 2.9 01/08/2016     She has been working on diet and exercise for prediabetes, and denies foot ulcerations, hyperglycemia, hypoglycemia , increased appetite, nausea, paresthesia of the feet, polydipsia, polyuria, visual disturbances, vomiting and weight loss. Last A1C in the office was:  Lab Results  Component Value Date   HGBA1C 5.5 01/08/2016    Patient is on Vitamin D supplement.  Lab Results  Component Value Date   VD25OH 18 (L) 01/08/2016     She will take 1 gabapentin at night and morning, states it does  not help with pain, will take aleve and tramadol. Has bilateral lower back pain, radiation down bilateral legs but worse in left leg, will have "heat sensation" down her leg to her foot occ. Will have weakness in her legs. Was following with Dr. Mina Marble.   BMI is Body mass index is 55.04 kg/m., she is working on diet and exercise. Wt Readings from Last 3 Encounters:  04/14/16 (!) 341 lb (154.7 kg)  01/08/16 (!) 343 lb 9.6 oz (155.9 kg)  10/02/15 (!) 341 lb (154.7 kg)   Patient is on allopurinol for gout and does not report a recent flare.  Lab Results  Component Value Date   LABURIC 6.9 07/01/2015       Current Medications:  Current Outpatient Prescriptions on File Prior to Visit  Medication Sig Dispense Refill  . allopurinol (ZYLOPRIM) 300 MG tablet TAKE 1 TABLET (300 MG TOTAL) BY MOUTH DAILY. 90 tablet 1  . aspirin 81 MG tablet Take 81 mg by mouth daily.    . bumetanide (BUMEX) 1 MG tablet Take 1 tablet (1 mg total) by mouth as needed. 30 tablet 4  . doxazosin (CARDURA) 4 MG tablet TAKE 1 TO 2 TABLETS BY MOUTH AT BEDTIME FOR BLOOD PRESSURE 180 tablet 1  . gabapentin (NEURONTIN) 600 MG tablet Take 600 mg by mouth 3 (three) times daily.    . traMADol (ULTRAM) 50 MG tablet Take 1 tablet 4 x day only if needed for pain 120 tablet 2   No current facility-administered medications on file prior to visit.  Medical History:  Past Medical History:  Diagnosis Date  . Anemia   . DDD (degenerative disc disease)   . DJD (degenerative joint disease)   . Elevated hemoglobin A1c   . GERD (gastroesophageal reflux disease)   . Hyperlipidemia   . Hypertension   . Obesity   . Sickle cell trait (HCC)     Allergies:  Allergies  Allergen Reactions  . Ace Inhibitors     Cough  . Paxil [Paroxetine Hcl]     Mood swings  . Prednisone     Nausea/ irratated  . Vitamin D Analogs     Cramping     Review of Systems:  Review of Systems  Constitutional: Negative for chills, fever and  malaise/fatigue.  HENT: Negative for congestion, ear pain and sore throat.   Eyes: Negative.   Respiratory: Negative for cough, shortness of breath and wheezing.   Cardiovascular: Negative for chest pain, palpitations and leg swelling.  Gastrointestinal: Negative for abdominal pain, blood in stool, constipation, diarrhea, heartburn and melena.  Genitourinary: Negative.   Musculoskeletal: Positive for back pain.  Skin: Negative.   Neurological: Negative for dizziness, sensory change, loss of consciousness and headaches.  Psychiatric/Behavioral: Negative for depression. The patient is not nervous/anxious and does not have insomnia.     Family history- Review and unchanged  Social history- Review and unchanged  Physical Exam: BP 138/80   Pulse 84   Temp 97.7 F (36.5 C)   Ht 5\' 6"  (1.676 m)   Wt (!) 341 lb (154.7 kg)   BMI 55.04 kg/m  Wt Readings from Last 3 Encounters:  04/14/16 (!) 341 lb (154.7 kg)  01/08/16 (!) 343 lb 9.6 oz (155.9 kg)  10/02/15 (!) 341 lb (154.7 kg)    General Appearance: Well nourished well developed, in no apparent distress. Eyes: PERRLA, EOMs, conjunctiva no swelling or erythema ENT/Mouth: Ear canals normal without obstruction, swelling, erythma, discharge.  TMs normal bilaterally.  Oropharynx moist, clear, without exudate, or postoropharyngeal swelling. Neck: Supple, thyroid normal,no cervical adenopathy  Respiratory: Respiratory effort normal, Breath sounds clear A&P without rhonchi, wheeze, or rale.  No retractions, no accessory usage. Cardio: RRR with no MRGs. Brisk peripheral pulses without edema.  Abdomen: Soft, + BS,  Non tender, no guarding, rebound, hernias, masses. Musculoskeletal: Full ROM, 5/5 strength, antalgic gait.  Skin: Warm, dry without rashes, lesions, ecchymosis.  Neuro: Awake and oriented X 3, Cranial nerves intact. Normal muscle tone, no cerebellar symptoms. Psych: Normal affect, Insight and Judgment appropriate.    Vicie Mutters, PA-C 10:33 AM [facility]

## 2016-04-14 NOTE — Patient Instructions (Signed)
Stop diet soda Increase water to 80oz a day Eat breakfast, try protein shake, protein yogurt like oikos triple zero, or eggs  Decrease gabapentin to 1 at night, non in the morning,  Going to start you on topamax, start on 1/2 pill for 2 weeks at night can increase to a whole pill for , and can go up to 50mg  at night (2 pills). Let me know how you are doing with this.

## 2016-04-15 LAB — VITAMIN D 25 HYDROXY (VIT D DEFICIENCY, FRACTURES): Vit D, 25-Hydroxy: 15 ng/mL — ABNORMAL LOW (ref 30–100)

## 2016-05-22 ENCOUNTER — Other Ambulatory Visit: Payer: Self-pay | Admitting: Internal Medicine

## 2016-05-22 DIAGNOSIS — M150 Primary generalized (osteo)arthritis: Principal | ICD-10-CM

## 2016-05-22 DIAGNOSIS — M8949 Other hypertrophic osteoarthropathy, multiple sites: Secondary | ICD-10-CM

## 2016-05-22 DIAGNOSIS — M159 Polyosteoarthritis, unspecified: Secondary | ICD-10-CM

## 2016-07-13 ENCOUNTER — Other Ambulatory Visit: Payer: Self-pay | Admitting: Obstetrics and Gynecology

## 2016-07-13 DIAGNOSIS — Z1231 Encounter for screening mammogram for malignant neoplasm of breast: Secondary | ICD-10-CM

## 2016-07-27 ENCOUNTER — Ambulatory Visit (INDEPENDENT_AMBULATORY_CARE_PROVIDER_SITE_OTHER): Payer: 59 | Admitting: Internal Medicine

## 2016-07-27 ENCOUNTER — Encounter: Payer: Self-pay | Admitting: Internal Medicine

## 2016-07-27 VITALS — BP 136/88 | HR 72 | Temp 97.3°F | Resp 16 | Ht 66.25 in | Wt 332.1 lb

## 2016-07-27 DIAGNOSIS — Z0001 Encounter for general adult medical examination with abnormal findings: Secondary | ICD-10-CM | POA: Diagnosis not present

## 2016-07-27 DIAGNOSIS — Z1212 Encounter for screening for malignant neoplasm of rectum: Secondary | ICD-10-CM

## 2016-07-27 DIAGNOSIS — E559 Vitamin D deficiency, unspecified: Secondary | ICD-10-CM

## 2016-07-27 DIAGNOSIS — I10 Essential (primary) hypertension: Secondary | ICD-10-CM

## 2016-07-27 DIAGNOSIS — Z111 Encounter for screening for respiratory tuberculosis: Secondary | ICD-10-CM | POA: Diagnosis not present

## 2016-07-27 DIAGNOSIS — Z79899 Other long term (current) drug therapy: Secondary | ICD-10-CM

## 2016-07-27 DIAGNOSIS — Z136 Encounter for screening for cardiovascular disorders: Secondary | ICD-10-CM

## 2016-07-27 DIAGNOSIS — R5383 Other fatigue: Secondary | ICD-10-CM

## 2016-07-27 DIAGNOSIS — M1000 Idiopathic gout, unspecified site: Secondary | ICD-10-CM

## 2016-07-27 DIAGNOSIS — R7309 Other abnormal glucose: Secondary | ICD-10-CM

## 2016-07-27 DIAGNOSIS — E782 Mixed hyperlipidemia: Secondary | ICD-10-CM

## 2016-07-27 LAB — HEPATIC FUNCTION PANEL
ALK PHOS: 116 U/L (ref 33–130)
ALT: 15 U/L (ref 6–29)
AST: 16 U/L (ref 10–35)
Albumin: 4.1 g/dL (ref 3.6–5.1)
BILIRUBIN DIRECT: 0.2 mg/dL (ref <=0.2)
BILIRUBIN INDIRECT: 0.7 mg/dL (ref 0.2–1.2)
BILIRUBIN TOTAL: 0.9 mg/dL (ref 0.2–1.2)
Total Protein: 7.7 g/dL (ref 6.1–8.1)

## 2016-07-27 LAB — CBC WITH DIFFERENTIAL/PLATELET
BASOS PCT: 0 %
Basophils Absolute: 0 cells/uL (ref 0–200)
EOS PCT: 2 %
Eosinophils Absolute: 118 cells/uL (ref 15–500)
HCT: 39.5 % (ref 35.0–45.0)
HEMOGLOBIN: 12.8 g/dL (ref 11.7–15.5)
LYMPHS ABS: 2124 {cells}/uL (ref 850–3900)
Lymphocytes Relative: 36 %
MCH: 27.6 pg (ref 27.0–33.0)
MCHC: 32.4 g/dL (ref 32.0–36.0)
MCV: 85.1 fL (ref 80.0–100.0)
MPV: 9.7 fL (ref 7.5–12.5)
Monocytes Absolute: 354 cells/uL (ref 200–950)
Monocytes Relative: 6 %
Neutro Abs: 3304 cells/uL (ref 1500–7800)
Neutrophils Relative %: 56 %
PLATELETS: 224 10*3/uL (ref 140–400)
RBC: 4.64 MIL/uL (ref 3.80–5.10)
RDW: 14 % (ref 11.0–15.0)
WBC: 5.9 10*3/uL (ref 3.8–10.8)

## 2016-07-27 LAB — BASIC METABOLIC PANEL WITH GFR
BUN: 15 mg/dL (ref 7–25)
CHLORIDE: 102 mmol/L (ref 98–110)
CO2: 24 mmol/L (ref 20–31)
Calcium: 9.3 mg/dL (ref 8.6–10.4)
Creat: 0.92 mg/dL (ref 0.50–0.99)
GFR, EST NON AFRICAN AMERICAN: 67 mL/min (ref >=60)
GFR, Est African American: 77 mL/min (ref >=60)
Glucose, Bld: 112 mg/dL — ABNORMAL HIGH (ref 65–99)
POTASSIUM: 4.3 mmol/L (ref 3.5–5.3)
SODIUM: 139 mmol/L (ref 135–146)

## 2016-07-27 LAB — IRON AND TIBC
%SAT: 22 % (ref 11–50)
IRON: 76 ug/dL (ref 45–160)
TIBC: 345 ug/dL (ref 250–450)
UIBC: 269 ug/dL (ref 125–400)

## 2016-07-27 LAB — LIPID PANEL
CHOL/HDL RATIO: 2.8 ratio (ref <5.0)
Cholesterol: 167 mg/dL (ref <200)
HDL: 60 mg/dL (ref >50)
LDL CALC: 89 mg/dL (ref <100)
Triglycerides: 91 mg/dL (ref <150)
VLDL: 18 mg/dL (ref <30)

## 2016-07-27 LAB — TSH: TSH: 3.32 m[IU]/L

## 2016-07-27 LAB — MAGNESIUM: MAGNESIUM: 2 mg/dL (ref 1.5–2.5)

## 2016-07-27 LAB — URIC ACID: URIC ACID, SERUM: 8.7 mg/dL — AB (ref 2.5–7.0)

## 2016-07-27 NOTE — Progress Notes (Signed)
[facility] Unk Pinto, M.D.    Uvaldo Bristle. Silverio Lay, P.A.-C      Starlyn Skeans, P.A.-C  [facility]                [address] [place] [address] Telephone [phone] Telefax [phone]  Annual Screening/Preventative Visit & Comprehensive Evaluation &  Examination     This very nice 61 y.o. SBF presents for a Screening/Preventative Visit & comprehensive evaluation and management of multiple medical co-morbidities.  Patient has been followed for HTN, Morbid Obesity, Prediabetes, Hyperlipidemia and Vitamin D Deficiency.      HTN predates since 65. Patient's BP has been controlled at home and patient denies any cardiac symptoms as chest pain, palpitations, shortness of breath, dizziness or ankle swelling. Today's BP is near goal - 136/88.      Patient's hyperlipidemia is controlled with diet and medications. Patient denies myalgias or other medication SE's. Last lipids were at goal: Lab Results  Component Value Date   CHOL 159 04/14/2016   HDL 57 04/14/2016   LDLCALC 88 04/14/2016   TRIG 70 04/14/2016   CHOLHDL 2.8 04/14/2016      Patient has Morbid Obesity (BMI 53+) and  Prediabetes (A1c 5.9% in 2011)  and patient denies reactive hypoglycemic symptoms, visual blurring, diabetic polys, or paresthesias. Last A1c was at goal:  Lab Results  Component Value Date   HGBA1C 5.3 04/14/2016      Finally, patient has history of Vitamin D Deficiency ("18" in 2008) and she will not supplement as repeatedly recommended in the past due to perceived GI Intolerance. Consequently her last Vitamin D was still EXTREMELY LOW: Lab Results  Component Value Date   VD25OH 15 (L) 04/14/2016   Current Outpatient Prescriptions on File Prior to Visit  Medication Sig  . allopurinol (ZYLOPRIM) 300 MG tablet TAKE 1 TABLET (300 MG TOTAL) BY MOUTH DAILY.  Marland Kitchen aspirin 81 MG tablet Take 81 mg by mouth daily.  .  bumetanide (BUMEX) 1 MG tablet Take 1 tablet (1 mg total) by mouth as needed.  . doxazosin (CARDURA) 4 MG tablet TAKE 1 TO 2 TABLETS BY MOUTH AT BEDTIME FOR BLOOD PRESSURE  . gabapentin (NEURONTIN) 600 MG tablet Take 600 mg by mouth 3 (three) times daily.  . traMADol (ULTRAM) 50 MG tablet TAKE 1 TABLET BY MOUTH 4 TIMES A DAY ONLY IF NEEDED FOR PAIN   No current facility-administered medications on file prior to visit.    Allergies  Allergen Reactions  . Ace Inhibitors     Cough  . Paxil [Paroxetine Hcl]     Mood swings  . Prednisone     Nausea/ irratated  . Vitamin D Analogs     Cramping   Past Medical History:  Diagnosis Date  . Anemia   . DDD (degenerative disc disease)   . DJD (degenerative joint disease)   . Elevated hemoglobin A1c   . GERD (gastroesophageal reflux disease)   . Hyperlipidemia   . Hypertension   . Obesity   . Sickle cell trait [facility])    Health Maintenance  Topic Date Due  . PAP SMEAR  06/09/1975  . COLONOSCOPY  06/08/2004  . INFLUENZA VACCINE  12/23/2015  . MAMMOGRAM  07/02/2017  . TETANUS/TDAP  06/30/2025  . Hepatitis C Screening  Completed  . HIV Screening  Completed   Immunization History  Administered Date(s) Administered  . PPD Test 05/02/2013, 06/18/2014, 07/27/2016  . Td 07/01/2015   Past Surgical History:  Procedure Laterality Date  . SHOULDER SURGERY Left 2007   Family History  Problem Relation Age of Onset  . Heart disease Mother   . Cancer Father     prostate  . Multiple myeloma Father   . Diabetes Sister   . Mental illness Sister   . Diabetes Brother   . Obstructive Sleep Apnea Brother    Social History  Substance Use Topics  . Smoking status: Never Smoker  . Smokeless tobacco: Not on file  . Alcohol use 3.0 oz/week    6 drink(s) per week    ROS Constitutional: Denies fever, chills, weight loss/gain, headaches, insomnia,  night sweats, and change in appetite. Does c/o fatigue. Eyes: Denies redness, blurred vision,  diplopia, discharge, itchy, watery eyes.  ENT: Denies discharge, congestion, post nasal drip, epistaxis, sore throat, earache, hearing loss, dental pain, Tinnitus, Vertigo, Sinus pain, snoring.  Cardio: Denies chest pain, palpitations, irregular heartbeat, syncope, dyspnea, diaphoresis, orthopnea, PND, claudication, edema Respiratory: denies cough, dyspnea, DOE, pleurisy, hoarseness, laryngitis, wheezing.  Gastrointestinal: Denies dysphagia, heartburn, reflux, water brash, pain, cramps, nausea, vomiting, bloating, diarrhea, constipation, hematemesis, melena, hematochezia, jaundice, hemorrhoids Genitourinary: Denies dysuria, frequency, urgency, nocturia, hesitancy, discharge, hematuria, flank pain Breast: Breast lumps, nipple discharge, bleeding.  Musculoskeletal: Denies arthralgia, myalgia, stiffness, Jt. Swelling, pain, limp, and strain/sprain. Denies falls. Skin: Denies puritis, rash, hives, warts, acne, eczema, changing in skin lesion Neuro: No weakness, tremor, incoordination, spasms, paresthesia, pain Psychiatric: Denies confusion, memory loss, sensory loss. Denies Depression. Endocrine: Denies change in weight, skin, hair change, nocturia, and paresthesia, diabetic polys, visual blurring, hyper / hypo glycemic episodes.  Heme/Lymph: No excessive bleeding, bruising, enlarged lymph nodes.  Physical Exam  BP 136/88   Pulse 72   Temp 97.3 F (36.3 C)   Resp 16   Ht 5' 6.25" (1.683 m)   Wt (!) 332 lb 1.6 oz (150.6 kg)   BMI 53.20 kg/m   General Appearance: Well nourished and in no apparent distress.  Eyes: PERRLA, EOMs, conjunctiva no swelling or erythema, normal fundi and vessels. Sinuses: No frontal/maxillary tenderness ENT/Mouth: EACs patent / TMs  nl. Nares clear without erythema, swelling, mucoid exudates. Oral hygiene is good. No erythema, swelling, or exudate. Tongue normal, non-obstructing. Tonsils not swollen or erythematous. Hearing normal.  Neck: Supple, thyroid normal. No  bruits, nodes or JVD. Respiratory: Respiratory effort normal.  BS equal and clear bilateral without rales, rhonci, wheezing or stridor. Cardio: Heart sounds are normal with regular rate and rhythm and no murmurs, rubs or gallops. Peripheral pulses are normal and equal bilaterally without edema. No aortic or femoral bruits. Chest: symmetric with normal excursions and percussion. Breasts: Deferred to Dr Ulanda Edison (upcoming pap, MGM & breast exam) Abdomen: Flat, soft with bowel sounds active. Nontender, no guarding, rebound, hernias, masses, or organomegaly.  Lymphatics: Non tender without lymphadenopathy.  Musculoskeletal: Full ROM all peripheral extremities, joint stability, 5/5 strength, and normal gait. Skin: Warm and dry without rashes, lesions, cyanosis, clubbing or  ecchymosis.  Neuro: Cranial nerves intact, reflexes equal bilaterally. Normal muscle tone, no cerebellar symptoms. Sensation intact.  Pysch: Alert and oriented X 3, normal affect, Insight and Judgment appropriate.   Assessment and Plan  1. Annual Preventative Screening Examination   2. Essential hypertension  - Microalbumin / creatinine urine ratio - EKG 12-Lead - [place], RETROPERITNL ABD,  LTD - Urinalysis, Routine w reflex microscopic - BASIC METABOLIC  PANEL WITH GFR - Magnesium - TSH  3. Mixed hyperlipidemia  - EKG 12-Lead - [place], RETROPERITNL ABD,  LTD - Hepatic function panel - Lipid panel - TSH  4. PreDiabetes  - EKG 12-Lead - [place], RETROPERITNL ABD,  LTD - Hemoglobin A1c - Insulin, random  5. Vitamin D deficiency  - VITAMIN D 25 Hydroxy   6. Morbid obesity due to excess calories ([place])   7. Idiopathic gout,  - Uric acid  8. Screening for rectal cancer  - POC Hemoccult Bld/Stl   9. Screening for ischemic heart disease  - EKG 12-Lead  10. Screening for AAA (aortic abdominal aneurysm)  - [place], RETROPERITNL ABD,  LTD  11. Medication management  - EKG 12-Lead - Uric acid - CBC with  Differential/Platelet - BASIC METABOLIC PANEL WITH GFR - Hepatic function panel - Magnesium - Lipid panel - TSH - Hemoglobin A1c - Insulin, random - VITAMIN D 25 Hydroxy   12. Fatigue  - Vitamin B12 - Iron and TIBC - CBC with Differential/Platelet  13. Screening examination for pulmonary tuberculosis  - PPD       Continue prudent diet as discussed, weight control, BP monitoring, regular exercise, and medications. Discussed med's effects and SE's. Screening labs and tests as requested with regular follow-up as recommended. Over 40 minutes of exam, counseling, chart review and high complex critical decision making was performed.

## 2016-07-27 NOTE — Patient Instructions (Signed)

## 2016-07-28 ENCOUNTER — Ambulatory Visit
Admission: RE | Admit: 2016-07-28 | Discharge: 2016-07-28 | Disposition: A | Discharge status: Home / Self Care | Payer: 59 | Source: Ambulatory Visit | Attending: Obstetrics and Gynecology | Admitting: Obstetrics and Gynecology

## 2016-07-28 DIAGNOSIS — Z124 Encounter for screening for malignant neoplasm of cervix: Secondary | ICD-10-CM | POA: Diagnosis not present

## 2016-07-28 DIAGNOSIS — Z01419 Encounter for gynecological examination (general) (routine) without abnormal findings: Secondary | ICD-10-CM | POA: Diagnosis not present

## 2016-07-28 DIAGNOSIS — Z1231 Encounter for screening mammogram for malignant neoplasm of breast: Secondary | ICD-10-CM

## 2016-07-28 DIAGNOSIS — R8290 Unspecified abnormal findings in urine: Secondary | ICD-10-CM | POA: Diagnosis not present

## 2016-07-28 LAB — URINALYSIS, ROUTINE W REFLEX MICROSCOPIC
BILIRUBIN URINE: NEGATIVE
Glucose, UA: NEGATIVE
HGB URINE DIPSTICK: NEGATIVE
KETONES UR: NEGATIVE
Leukocytes, UA: NEGATIVE
NITRITE: NEGATIVE
PH: 5 (ref 5.0–8.0)
Protein, ur: NEGATIVE
SPECIFIC GRAVITY, URINE: 1.016 (ref 1.001–1.035)

## 2016-07-28 LAB — MICROALBUMIN / CREATININE URINE RATIO
CREATININE, URINE: 108 mg/dL (ref 20–320)
Microalb Creat Ratio: 19 mcg/mg creat (ref <30)
Microalb, Ur: 2.1 mg/dL

## 2016-07-28 LAB — VITAMIN D 25 HYDROXY (VIT D DEFICIENCY, FRACTURES): VIT D 25 HYDROXY: 14 ng/mL — AB (ref 30–100)

## 2016-07-28 LAB — INSULIN, RANDOM: Insulin: 16 u[IU]/mL (ref 2.0–19.6)

## 2016-07-28 LAB — HEMOGLOBIN A1C
Hgb A1c MFr Bld: 5.2 % (ref <5.7)
Mean Plasma Glucose: 103 mg/dL

## 2016-07-28 LAB — VITAMIN B12: VITAMIN B 12: 366 pg/mL (ref 200–1100)

## 2016-07-29 LAB — TB SKIN TEST
INDURATION: 0 mm
TB Skin Test: NEGATIVE

## 2016-08-04 DIAGNOSIS — H524 Presbyopia: Secondary | ICD-10-CM | POA: Diagnosis not present

## 2016-08-04 DIAGNOSIS — H35033 Hypertensive retinopathy, bilateral: Secondary | ICD-10-CM | POA: Diagnosis not present

## 2016-09-13 DIAGNOSIS — H35033 Hypertensive retinopathy, bilateral: Secondary | ICD-10-CM | POA: Diagnosis not present

## 2016-10-19 ENCOUNTER — Encounter: Payer: Self-pay | Admitting: Podiatry

## 2016-10-19 ENCOUNTER — Ambulatory Visit (INDEPENDENT_AMBULATORY_CARE_PROVIDER_SITE_OTHER): Payer: 59 | Admitting: Podiatry

## 2016-10-19 DIAGNOSIS — Q828 Other specified congenital malformations of skin: Secondary | ICD-10-CM

## 2016-10-19 DIAGNOSIS — M79676 Pain in unspecified toe(s): Secondary | ICD-10-CM | POA: Diagnosis not present

## 2016-10-19 DIAGNOSIS — L819 Disorder of pigmentation, unspecified: Secondary | ICD-10-CM | POA: Diagnosis not present

## 2016-10-19 DIAGNOSIS — L603 Nail dystrophy: Secondary | ICD-10-CM | POA: Diagnosis not present

## 2016-10-19 DIAGNOSIS — A499 Bacterial infection, unspecified: Secondary | ICD-10-CM | POA: Diagnosis not present

## 2016-10-19 DIAGNOSIS — L609 Nail disorder, unspecified: Secondary | ICD-10-CM | POA: Diagnosis not present

## 2016-10-19 NOTE — Progress Notes (Signed)
   Subjective:    Patient ID: Alicia Diaz, female    DOB: Sep 28, 1955, 61 y.o.   MRN: [id number]  HPI: She presents today on recommendation of her primary care provider for history of corns and thick painful toenails. She relates that she had Dr. Janus Molder perform nail surgery on her in the past he also worked on her corns. She states that her primary provider had put her on oral medication one time for the toenails but he did nothing.    Review of Systems  All other systems reviewed and are negative.      Objective:   Physical Exam: Vital signs are stable she is alert and oriented 3. Pulses are palpable. Neurologic sensorium is intact. Deep tendon reflexes are intact. Muscle strength is normal. Mild pes planus is noted bilateral. Hammertoe deformities with over reactive hyperkeratotic tissue overlying the PIPJ's of the lesser digits. No open lesions or wounds are noted. Her toenails appear be thick yellow dystrophic clinically mycotic        Assessment & Plan:  Assessment: Porokeratosis painful thick dystrophic nails.  Plan: Cells of her nails are taken today to be sent for pathologic evaluation I debrided all reactive hyperkeratoses for her.

## 2016-10-19 NOTE — Addendum Note (Signed)
Addended by: Rip Harbour on: 10/19/2016 02:53 PM   Modules accepted: Orders

## 2016-10-28 ENCOUNTER — Telehealth: Payer: Self-pay | Admitting: *Deleted

## 2016-10-28 NOTE — Telephone Encounter (Addendum)
-----   Message from Garrel Ridgel, [place] sent at 10/28/2016  6:58 AM EDT ----- Negative for fungus. Left message informing pt I could try to contact on home phone with lab results. I informed pt of Dr. Stephenie Acres review of results.

## 2016-11-01 ENCOUNTER — Ambulatory Visit: Payer: Self-pay | Admitting: Internal Medicine

## 2016-11-15 NOTE — Progress Notes (Signed)
Assessment and Plan:  Hypertension:  -Continue medication,  -monitor blood pressure at home CALL IF STAYS ABOVE 140/90 -Continue DASH diet.   -Reminder to go to the ER if any CP, SOB, nausea, dizziness, severe HA, changes vision/speech, left arm numbness and tingling, and jaw pain.  Cholesterol: -Continue diet and exercise.  -Check cholesterol.   Pre-diabetes: -recommended looking into water aerobics for exercise -Continue diet and exercise.  -Check A1C  Vitamin D Def: -check level -continue medications.   Sickle cell trait monitor  Morbid Obesity with co morbidities - long discussion about weight loss, diet, and exercise  Abscess breast Keflex, heat, follow up 1 month, if not better will get diagnostic MGM  Continue diet and meds as discussed. Further disposition pending results of labs.  HPI 61 y.o. female  presents for 3 month follow up with hypertension, hyperlipidemia, prediabetes and vitamin D.   Her blood pressure has been controlled at home, she is on bumex 1 mg and cardura 4mg  a day, today their BP is BP: (!) 144/80.   She does not workout. She denies chest pain, shortness of breath, dizziness.  Left breast with knot medial to nipple, last week felt rough, knot, no discharge from this one, no warmth, tenderness. Has had something similar in the past.    She is on cholesterol medication and denies myalgias. Her cholesterol is at goal. The cholesterol last visit was:   Lab Results  Component Value Date   CHOL 167 07/27/2016   HDL 60 07/27/2016   LDLCALC 89 07/27/2016   TRIG 91 07/27/2016   CHOLHDL 2.8 07/27/2016    She has been working on diet and exercise for prediabetes, and denies foot ulcerations, hyperglycemia, hypoglycemia , increased appetite, nausea, paresthesia of the feet, polydipsia, polyuria, visual disturbances, vomiting and weight loss. Last A1C in the office was:  Lab Results  Component Value Date   HGBA1C 5.2 07/27/2016   Patient is NOT on  Vitamin D supplement.  Lab Results  Component Value Date   VD25OH 14 (L) 07/27/2016     She follows with Dr. Mina Marble for her back pain.   BMI is Body mass index is 52.61 kg/m., she is working on diet and exercise. Wt Readings from Last 3 Encounters:  11/16/16 (!) 328 lb 6.4 oz (149 kg)  07/27/16 (!) 332 lb 1.6 oz (150.6 kg)  04/14/16 (!) 341 lb (154.7 kg)   Patient is on allopurinol for gout and does not report a recent flare.  Lab Results  Component Value Date   LABURIC 8.7 (H) 07/27/2016       Current Medications:  Current Outpatient Prescriptions on File Prior to Visit  Medication Sig Dispense Refill  . allopurinol (ZYLOPRIM) 300 MG tablet TAKE 1 TABLET (300 MG TOTAL) BY MOUTH DAILY. 90 tablet 1  . aspirin 81 MG tablet Take 81 mg by mouth daily.    . bumetanide (BUMEX) 1 MG tablet Take 1 tablet (1 mg total) by mouth as needed. 30 tablet 4  . doxazosin (CARDURA) 4 MG tablet TAKE 1 TO 2 TABLETS BY MOUTH AT BEDTIME FOR BLOOD PRESSURE 180 tablet 1  . gabapentin (NEURONTIN) 600 MG tablet Take 600 mg by mouth 3 (three) times daily.     No current facility-administered medications on file prior to visit.     Medical History:  Past Medical History:  Diagnosis Date  . Anemia   . DDD (degenerative disc disease)   . DJD (degenerative joint disease)   . Elevated  hemoglobin A1c   . GERD (gastroesophageal reflux disease)   . Hyperlipidemia   . Hypertension   . Obesity   . Sickle cell trait (HCC)     Allergies:  Allergies  Allergen Reactions  . Ace Inhibitors     Cough  . Losartan   . Paxil [Paroxetine Hcl]     Mood swings  . Prednisone     Nausea/ irratated  . Vitamin D Analogs     Cramping     Review of Systems:  Review of Systems  Constitutional: Negative for chills, fever and malaise/fatigue.  HENT: Negative for congestion, ear pain and sore throat.   Eyes: Negative.   Respiratory: Negative for cough, shortness of breath and wheezing.   Cardiovascular:  Negative for chest pain, palpitations and leg swelling.  Gastrointestinal: Negative for abdominal pain, blood in stool, constipation, diarrhea, heartburn and melena.  Genitourinary: Negative.   Musculoskeletal: Negative for back pain.  Skin: Negative.   Neurological: Negative for dizziness, sensory change, loss of consciousness and headaches.  Psychiatric/Behavioral: Negative for depression. The patient is not nervous/anxious and does not have insomnia.     Family history- Review and unchanged  Social history- Review and unchanged  Physical Exam: BP (!) 144/80   Pulse 74   Temp 97.3 F (36.3 C)   Resp 14   Wt (!) 328 lb 6.4 oz (149 kg)   BMI 52.61 kg/m  Wt Readings from Last 3 Encounters:  11/16/16 (!) 328 lb 6.4 oz (149 kg)  07/27/16 (!) 332 lb 1.6 oz (150.6 kg)  04/14/16 (!) 341 lb (154.7 kg)    General Appearance: Well nourished well developed, in no apparent distress. Eyes: PERRLA, EOMs, conjunctiva no swelling or erythema ENT/Mouth: Ear canals normal without obstruction, swelling, erythma, discharge.  TMs normal bilaterally.  Oropharynx moist, clear, without exudate, or postoropharyngeal swelling. Neck: Supple, thyroid normal,no cervical adenopathy  Respiratory: Respiratory effort normal, Breath sounds clear A&P without rhonchi, wheeze, or rale.  No retractions, no accessory usage. Cardio: RRR with no MRGs. Brisk peripheral pulses without edema.  Abdomen: Soft, + BS,  Non tender, no guarding, rebound, hernias, masses. Musculoskeletal: Full ROM, 5/5 strength, antalgic gait.  Skin: Warm, dry without rashes, lesions, ecchymosis.  Neuro: Awake and oriented X 3, Cranial nerves intact. Normal muscle tone, no cerebellar symptoms. Breast Left breast at 10 oclock with non erythematous nodule with mild tenderness, non fluctuant, some peeling skin circularly around it.  Psych: Normal affect, Insight and Judgment appropriate.    Vicie Mutters, PA-C 12:31 PM [facility]

## 2016-11-16 ENCOUNTER — Encounter: Payer: Self-pay | Admitting: Physician Assistant

## 2016-11-16 ENCOUNTER — Ambulatory Visit (INDEPENDENT_AMBULATORY_CARE_PROVIDER_SITE_OTHER): Payer: 59 | Admitting: Physician Assistant

## 2016-11-16 VITALS — BP 144/80 | HR 74 | Temp 97.3°F | Resp 14 | Wt 328.4 lb

## 2016-11-16 DIAGNOSIS — N611 Abscess of the breast and nipple: Secondary | ICD-10-CM

## 2016-11-16 DIAGNOSIS — M109 Gout, unspecified: Secondary | ICD-10-CM

## 2016-11-16 DIAGNOSIS — M8949 Other hypertrophic osteoarthropathy, multiple sites: Secondary | ICD-10-CM

## 2016-11-16 DIAGNOSIS — I10 Essential (primary) hypertension: Secondary | ICD-10-CM | POA: Diagnosis not present

## 2016-11-16 DIAGNOSIS — E559 Vitamin D deficiency, unspecified: Secondary | ICD-10-CM

## 2016-11-16 DIAGNOSIS — M150 Primary generalized (osteo)arthritis: Secondary | ICD-10-CM | POA: Diagnosis not present

## 2016-11-16 DIAGNOSIS — E782 Mixed hyperlipidemia: Secondary | ICD-10-CM

## 2016-11-16 DIAGNOSIS — D573 Sickle-cell trait: Secondary | ICD-10-CM | POA: Diagnosis not present

## 2016-11-16 DIAGNOSIS — Z79899 Other long term (current) drug therapy: Secondary | ICD-10-CM | POA: Diagnosis not present

## 2016-11-16 DIAGNOSIS — R7309 Other abnormal glucose: Secondary | ICD-10-CM

## 2016-11-16 DIAGNOSIS — M159 Polyosteoarthritis, unspecified: Secondary | ICD-10-CM

## 2016-11-16 LAB — CBC WITH DIFFERENTIAL/PLATELET
Basophils Absolute: 61 cells/uL (ref 0–200)
Basophils Relative: 1 %
EOS ABS: 183 {cells}/uL (ref 15–500)
Eosinophils Relative: 3 %
HEMATOCRIT: 38.9 % (ref 35.0–45.0)
Hemoglobin: 12.3 g/dL (ref 11.7–15.5)
LYMPHS PCT: 32 %
Lymphs Abs: 1952 cells/uL (ref 850–3900)
MCH: 27 pg (ref 27.0–33.0)
MCHC: 31.6 g/dL — AB (ref 32.0–36.0)
MCV: 85.5 fL (ref 80.0–100.0)
MONO ABS: 549 {cells}/uL (ref 200–950)
MPV: 10.2 fL (ref 7.5–12.5)
Monocytes Relative: 9 %
NEUTROS PCT: 55 %
Neutro Abs: 3355 cells/uL (ref 1500–7800)
Platelets: 234 10*3/uL (ref 140–400)
RBC: 4.55 MIL/uL (ref 3.80–5.10)
RDW: 14.7 % (ref 11.0–15.0)
WBC: 6.1 10*3/uL (ref 3.8–10.8)

## 2016-11-16 LAB — TSH: TSH: 3.35 mIU/L

## 2016-11-16 MED ORDER — TRIAMCINOLONE ACETONIDE 0.1 % EX OINT: 1.0000 "application " | TOPICAL_OINTMENT | Freq: Two times a day (BID) | CUTANEOUS | 1 refills | Status: DC

## 2016-11-16 MED ORDER — TRAMADOL HCL 50 MG PO TABS: ORAL_TABLET | ORAL | 2 refills | Status: DC

## 2016-11-16 MED ORDER — CEPHALEXIN 500 MG PO CAPS: 500.0000 mg | ORAL_CAPSULE | Freq: Four times a day (QID) | ORAL | 0 refills | Status: DC

## 2016-11-16 NOTE — Patient Instructions (Addendum)
[place]ron wrist blood pressure cuff  Monitor your blood pressure at home. Go to the ER if any CP, SOB, nausea, dizziness, severe HA, changes vision/speech  Goal BP:  For patients younger than 60: Goal BP < 140/90. For patients 60 and older: Goal BP < 150/90. For patients with diabetes: Goal BP < 140/90. Your most recent BP: BP: (!) 160/98  Recheck 144  Take your medications faithfully as instructed. Maintain a healthy weight. Get at least 150 minutes of aerobic exercise per week. Minimize salt intake. Minimize alcohol intake  DASH Eating Plan DASH stands for "Dietary Approaches to Stop Hypertension." The DASH eating plan is a healthy eating plan that has been shown to reduce high blood pressure (hypertension). Additional health benefits may include reducing the risk of type 2 diabetes mellitus, heart disease, and stroke. The DASH eating plan may also help with weight loss. WHAT DO I NEED TO KNOW ABOUT THE DASH EATING PLAN? For the DASH eating plan, you will follow these general guidelines:  Choose foods with a percent daily value for sodium of less than 5% (as listed on the food label).  Use salt-free seasonings or herbs instead of table salt or sea salt.  Check with your health care provider or pharmacist before using salt substitutes.  Eat lower-sodium products, often labeled as "lower sodium" or "no salt added."  Eat fresh foods.  Eat more vegetables, fruits, and low-fat dairy products.  Choose whole grains. Look for the word "whole" as the first word in the ingredient list.  Choose fish and skinless chicken or [place] more often than red meat. Limit fish, poultry, and meat to 6 oz (170 g) each day.  Limit sweets, desserts, sugars, and sugary drinks.  Choose heart-healthy fats.  Limit cheese to 1 oz (28 g) per day.  Eat more home-cooked food and less restaurant, buffet, and fast food.  Limit fried foods.  Cook foods using methods other than frying.  Limit canned  vegetables. If you do use them, rinse them well to decrease the sodium.  When eating at a restaurant, ask that your food be prepared with less salt, or no salt if possible. WHAT FOODS CAN I EAT? Seek help from a dietitian for individual calorie needs. Grains Whole grain or whole wheat bread. Brown rice. Whole grain or whole wheat pasta. Quinoa, bulgur, and whole grain cereals. Low-sodium cereals. Corn or whole wheat flour tortillas. Whole grain cornbread. Whole grain crackers. Low-sodium crackers. Vegetables Fresh or frozen vegetables (raw, steamed, roasted, or grilled). Low-sodium or reduced-sodium tomato and vegetable juices. Low-sodium or reduced-sodium tomato sauce and paste. Low-sodium or reduced-sodium canned vegetables.  Fruits All fresh, canned (in natural juice), or frozen fruits. Meat and Other Protein Products Ground beef (85% or leaner), grass-fed beef, or beef trimmed of fat. Skinless chicken or [place]. Ground chicken or [place]. Pork trimmed of fat. All fish and seafood. Eggs. Dried beans, peas, or lentils. Unsalted nuts and seeds. Unsalted canned beans. Dairy Low-fat dairy products, such as skim or 1% milk, 2% or reduced-fat cheeses, low-fat ricotta or cottage cheese, or plain low-fat yogurt. Low-sodium or reduced-sodium cheeses. Fats and Oils Tub margarines without trans fats. Light or reduced-fat mayonnaise and salad dressings (reduced sodium). Avocado. Safflower, olive, or canola oils. Natural peanut or almond butter. Other Unsalted popcorn and pretzels. The items listed above may not be a complete list of recommended foods or beverages. Contact your dietitian for more options. WHAT FOODS ARE NOT RECOMMENDED? Grains White bread. White pasta. White rice.  Refined cornbread. Bagels and croissants. Crackers that contain trans fat. Vegetables Creamed or fried vegetables. Vegetables in a cheese sauce. Regular canned vegetables. Regular canned tomato sauce and paste. Regular tomato  and vegetable juices. Fruits Dried fruits. Canned fruit in light or heavy syrup. Fruit juice. Meat and Other Protein Products Fatty cuts of meat. Ribs, chicken wings, bacon, sausage, bologna, salami, chitterlings, fatback, hot dogs, bratwurst, and packaged luncheon meats. Salted nuts and seeds. Canned beans with salt. Dairy Whole or 2% milk, cream, half-and-half, and cream cheese. Whole-fat or sweetened yogurt. Full-fat cheeses or blue cheese. Nondairy creamers and whipped toppings. Processed cheese, cheese spreads, or cheese curds. Condiments Onion and garlic salt, seasoned salt, table salt, and sea salt. Canned and packaged gravies. Worcestershire sauce. Tartar sauce. Barbecue sauce. Teriyaki sauce. Soy sauce, including reduced sodium. Steak sauce. Fish sauce. Oyster sauce. Cocktail sauce. Horseradish. Ketchup and mustard. Meat flavorings and tenderizers. Bouillon cubes. Hot sauce. Tabasco sauce. Marinades. Taco seasonings. Relishes. Fats and Oils Butter, stick margarine, lard, shortening, ghee, and bacon fat. Coconut, palm kernel, or palm oils. Regular salad dressings. Other Pickles and olives. Salted popcorn and pretzels. The items listed above may not be a complete list of foods and beverages to avoid. Contact your dietitian for more information. WHERE CAN I FIND MORE INFORMATION? National Heart, Lung, and Blood Institute: travelstabloid.com Document Released: 04/29/2011 Document Revised: 09/24/2013 Document Reviewed: 03/14/2013 [facility] Patient Information 2015 [place], [place]. This information is not intended to replace advice given to you by your health care provider. Make sure you discuss any questions you have with your health care provider.   Vitamin D goal is between 60-80  Please make sure that you are taking your Vitamin D as directed.   It is very important as a natural anti-inflammatory   helping hair, skin, and nails, as well as reducing  stroke and heart attack risk.   It helps your bones and helps with mood.  We want you on at least 5000 IU daily  It also decreases numerous cancer risks so please take it as directed.   Low Vit D is associated with a 200-300% higher risk for CANCER   and 200-300% higher risk for HEART   ATTACK  &  STROKE.    .....................................Marland Kitchen  It is also associated with higher death rate at younger ages,   autoimmune diseases like Rheumatoid arthritis, Lupus, Multiple Sclerosis.     Also many other serious conditions, like depression, Alzheimer's  Dementia, infertility, muscle aches, fatigue, fibromyalgia - just to name a few.  +++++++++++++++++++  Can get liquid vitamin D from [place] here in [place] at  [facility] alternatives [address], [place], [place] [address] Or you can try earth fare

## 2016-11-16 NOTE — Progress Notes (Signed)
Tramadol was called into pharmacy on 26th June 2018 @ 1:50pm by DD

## 2016-11-17 LAB — BASIC METABOLIC PANEL WITH GFR
BUN: 18 mg/dL (ref 7–25)
CALCIUM: 9.2 mg/dL (ref 8.6–10.4)
CO2: 24 mmol/L (ref 20–31)
CREATININE: 0.79 mg/dL (ref 0.50–0.99)
Chloride: 101 mmol/L (ref 98–110)
GFR, Est Non African American: 80 mL/min (ref >=60)
GLUCOSE: 114 mg/dL — AB (ref 65–99)
Potassium: 4.5 mmol/L (ref 3.5–5.3)
Sodium: 137 mmol/L (ref 135–146)

## 2016-11-17 LAB — HEPATIC FUNCTION PANEL
ALBUMIN: 4.1 g/dL (ref 3.6–5.1)
ALT: 14 U/L (ref 6–29)
AST: 17 U/L (ref 10–35)
Alkaline Phosphatase: 121 U/L (ref 33–130)
Bilirubin, Direct: 0.2 mg/dL (ref <=0.2)
Indirect Bilirubin: 0.6 mg/dL (ref 0.2–1.2)
TOTAL PROTEIN: 7.6 g/dL (ref 6.1–8.1)
Total Bilirubin: 0.8 mg/dL (ref 0.2–1.2)

## 2016-11-17 LAB — MAGNESIUM: MAGNESIUM: 2.1 mg/dL (ref 1.5–2.5)

## 2016-11-17 LAB — LIPID PANEL
CHOL/HDL RATIO: 2.9 ratio (ref <5.0)
CHOLESTEROL: 177 mg/dL (ref <200)
HDL: 61 mg/dL (ref >50)
LDL CALC: 103 mg/dL — AB (ref <100)
Triglycerides: 66 mg/dL (ref <150)
VLDL: 13 mg/dL (ref <30)

## 2016-11-17 LAB — URIC ACID: URIC ACID, SERUM: 4.9 mg/dL (ref 2.5–7.0)

## 2016-11-17 NOTE — Progress Notes (Signed)
Pt aware of lab results & voiced understanding of those results.

## 2016-11-22 ENCOUNTER — Other Ambulatory Visit: Payer: Self-pay | Admitting: Internal Medicine

## 2016-11-23 ENCOUNTER — Ambulatory Visit (INDEPENDENT_AMBULATORY_CARE_PROVIDER_SITE_OTHER): Payer: 59 | Admitting: Podiatry

## 2016-11-23 DIAGNOSIS — L603 Nail dystrophy: Secondary | ICD-10-CM

## 2016-11-23 DIAGNOSIS — Q828 Other specified congenital malformations of skin: Secondary | ICD-10-CM

## 2016-11-23 NOTE — Progress Notes (Signed)
She presents today chief complaint of painful calluses to the fifth toes and for follow-up of her pathology report of her toenails.  Objective: Pathology report demonstrates nail dystrophy with some bacterial colonization. Otherwise no fungus or yeast or multiple are visible. She does have hammertoe deformities resulting in reactive hyperkeratosis which is debrided for her today greater than 3 in number.  Assessment: Nail dystrophy bilateral. Calluses bilateral.  Plan: Debridement of porokeratotic lesions bilateral.

## 2016-12-09 NOTE — Progress Notes (Deleted)
   Assessment and Plan:    HPI 61 y.o.female presents for follow up for HTN and for breast abscess.  Her blood pressure {HAS HAS NOT:18834} been controlled at home, today their BP is   BMI is There is no height or weight on file to calculate BMI., she is working on diet and exercise. Wt Readings from Last 3 Encounters:  11/16/16 (!) 328 lb 6.4 oz (149 kg)  07/27/16 (!) 332 lb 1.6 oz (150.6 kg)  04/14/16 (!) 341 lb (154.7 kg)    Past Medical History:  Diagnosis Date  . Anemia   . DDD (degenerative disc disease)   . DJD (degenerative joint disease)   . Elevated hemoglobin A1c   . GERD (gastroesophageal reflux disease)   . Hyperlipidemia   . Hypertension   . Obesity   . Sickle cell trait (HCC)      Allergies  Allergen Reactions  . Ace Inhibitors     Cough  . Losartan   . Paxil [Paroxetine Hcl]     Mood swings  . Prednisone     Nausea/ irratated  . Vitamin D Analogs     Cramping    Current Outpatient Prescriptions on File Prior to Visit  Medication Sig  . allopurinol (ZYLOPRIM) 300 MG tablet TAKE 1 TABLET BY MOUTH EVERY DAY  . aspirin 81 MG tablet Take 81 mg by mouth daily.  . bumetanide (BUMEX) 1 MG tablet Take 1 tablet (1 mg total) by mouth as needed.  . cephALEXin (KEFLEX) 500 MG capsule Take 1 capsule (500 mg total) by mouth 4 (four) times daily. For 10 days.  Take with food.  . doxazosin (CARDURA) 4 MG tablet TAKE 1 TO 2 TABLETS BY MOUTH AT BEDTIME FOR BLOOD PRESSURE  . gabapentin (NEURONTIN) 600 MG tablet Take 600 mg by mouth 3 (three) times daily.  . traMADol (ULTRAM) 50 MG tablet TAKE 1 TABLET BY MOUTH 3 TIMES A DAY ONLY IF NEEDED FOR PAIN  . triamcinolone ointment (KENALOG) 0.1 % Apply 1 application topically 2 (two) times daily.   No current facility-administered medications on file prior to visit.     ROS: all negative except above.   Physical Exam: There were no vitals filed for this visit. There were no vitals taken for this visit. General  Appearance: Well nourished, in no apparent distress. Eyes: PERRLA, EOMs, conjunctiva no swelling or erythema Sinuses: No Frontal/maxillary tenderness ENT/Mouth: Ext aud canals clear, TMs without erythema, bulging. No erythema, swelling, or exudate on post pharynx.  Tonsils not swollen or erythematous. Hearing normal.  Neck: Supple, thyroid normal.  Respiratory: Respiratory effort normal, BS equal bilaterally without rales, rhonchi, wheezing or stridor.  Cardio: RRR with no MRGs. Brisk peripheral pulses without edema.  Abdomen: Soft, + BS.  Non tender, no guarding, rebound, hernias, masses. Lymphatics: Non tender without lymphadenopathy.  Musculoskeletal: Full ROM, 5/5 strength, normal gait.  Skin: Warm, dry without rashes, lesions, ecchymosis.  Neuro: Cranial nerves intact. Normal muscle tone, no cerebellar symptoms. Sensation intact.  Psych: Awake and oriented X 3, normal affect, Insight and Judgment appropriate.     Vicie Mutters, PA-C 7:42 AM [facility]

## 2016-12-13 ENCOUNTER — Ambulatory Visit: Payer: Self-pay | Admitting: Physician Assistant

## 2016-12-27 ENCOUNTER — Ambulatory Visit: Payer: Self-pay | Admitting: Physician Assistant

## 2017-02-04 ENCOUNTER — Ambulatory Visit: Payer: Self-pay | Admitting: Internal Medicine

## 2017-02-16 ENCOUNTER — Encounter: Payer: Self-pay | Admitting: Internal Medicine

## 2017-02-16 ENCOUNTER — Ambulatory Visit (INDEPENDENT_AMBULATORY_CARE_PROVIDER_SITE_OTHER): Payer: 59 | Admitting: Internal Medicine

## 2017-02-16 VITALS — BP 134/84 | HR 68 | Temp 97.7°F | Resp 18 | Ht 66.25 in | Wt 328.0 lb

## 2017-02-16 DIAGNOSIS — R7309 Other abnormal glucose: Secondary | ICD-10-CM

## 2017-02-16 DIAGNOSIS — E559 Vitamin D deficiency, unspecified: Secondary | ICD-10-CM

## 2017-02-16 DIAGNOSIS — M109 Gout, unspecified: Secondary | ICD-10-CM

## 2017-02-16 DIAGNOSIS — E782 Mixed hyperlipidemia: Secondary | ICD-10-CM | POA: Diagnosis not present

## 2017-02-16 DIAGNOSIS — Z79899 Other long term (current) drug therapy: Secondary | ICD-10-CM

## 2017-02-16 DIAGNOSIS — I10 Essential (primary) hypertension: Secondary | ICD-10-CM | POA: Diagnosis not present

## 2017-02-16 NOTE — Patient Instructions (Signed)

## 2017-02-16 NOTE — Progress Notes (Signed)
This very nice 61 y.o. single BF presents for 6 month follow up with Hypertension, Hyperlipidemia, Pre-Diabetes and Vitamin D Deficiency. Patient also has hx/o gout in remission.     Patient is treated for HTN (1982) & BP has been controlled at home. Today's BP is at goal - 134/84. Patient has had no complaints of any cardiac type chest pain, palpitations, dyspnea/orthopnea/PND, dizziness, claudication, or dependent edema.     Hyperlipidemia is controlled with diet & meds. Patient denies myalgias or other med SE's. Last Lipids were  Lab Results  Component Value Date   CHOL 177 11/16/2016   HDL 61 11/16/2016   LDLCALC 103 (H) 11/16/2016   TRIG 66 11/16/2016   CHOLHDL 2.9 11/16/2016      Also, the patient has history of  Morbid Obesity (BMI 52) and PreDiabetes (A1c 5.9%  / 2011) and has had no symptoms of reactive hypoglycemia, diabetic polys, paresthesias or visual blurring.  Last A1c was at goal) Lab Results  Component Value Date   HGBA1C 5.2 07/27/2016      Further, the patient also has history of severe Vitamin D Deficiency and alleges intolerance to Vit D. Last vitamin D was still very low: Lab Results  Component Value Date   VD25OH 14 (L) 07/27/2016   Current Outpatient Prescriptions on File Prior to Visit  Medication Sig  . allopurinol (ZYLOPRIM) 300 MG tablet TAKE 1 TABLET BY MOUTH EVERY DAY  . aspirin 81 MG tablet Take 81 mg by mouth daily.  . bumetanide (BUMEX) 1 MG tablet Take 1 tablet (1 mg total) by mouth as needed.  . doxazosin (CARDURA) 4 MG tablet TAKE 1 TO 2 TABLETS BY MOUTH AT BEDTIME FOR BLOOD PRESSURE  . gabapentin (NEURONTIN) 600 MG tablet Take 600 mg by mouth 3 (three) times daily.  . traMADol (ULTRAM) 50 MG tablet TAKE 1 TABLET BY MOUTH 3 TIMES A DAY ONLY IF NEEDED FOR PAIN  . triamcinolone ointment (KENALOG) 0.1 % Apply 1 application topically 2 (two) times daily.   No current facility-administered medications on file prior to visit.    Allergies    Allergen Reactions  . Ace Inhibitors     Cough  . Losartan   . Paxil [Paroxetine Hcl]     Mood swings  . Prednisone     Nausea/ irratated  . Vitamin D Analogs     Cramping   PMHx:   Past Medical History:  Diagnosis Date  . Anemia   . DDD (degenerative disc disease)   . DJD (degenerative joint disease)   . Elevated hemoglobin A1c   . GERD (gastroesophageal reflux disease)   . Hyperlipidemia   . Hypertension   . Obesity   . Sickle cell trait ([place])    Immunization History  Administered Date(s) Administered  . PPD Test 05/02/2013, 06/18/2014, 07/27/2016  . Td 07/01/2015   Past Surgical History:  Procedure Laterality Date  . SHOULDER SURGERY Left 2007   FHx:    Reviewed / unchanged  SHx:    Reviewed / unchanged  Systems Review:  Constitutional: Denies fever, chills, wt changes, headaches, insomnia, fatigue, night sweats, change in appetite. Eyes: Denies redness, blurred vision, diplopia, discharge, itchy, watery eyes.  ENT: Denies discharge, congestion, post nasal drip, epistaxis, sore throat, earache, hearing loss, dental pain, tinnitus, vertigo, sinus pain, snoring.  CV: Denies chest pain, palpitations, irregular heartbeat, syncope, dyspnea, diaphoresis, orthopnea, PND, claudication or edema. Respiratory: denies cough, dyspnea, DOE, pleurisy, hoarseness, laryngitis, wheezing.  Gastrointestinal: Denies dysphagia, odynophagia, heartburn, reflux, water brash, abdominal pain or cramps, nausea, vomiting, bloating, diarrhea, constipation, hematemesis, melena, hematochezia  or hemorrhoids. Genitourinary: Denies dysuria, frequency, urgency, nocturia, hesitancy, discharge, hematuria or flank pain. Musculoskeletal: Denies arthralgias, myalgias, stiffness, jt. swelling, pain, limping or strain/sprain.  Skin: Denies pruritus, rash, hives, warts, acne, eczema or change in skin lesion(s). Neuro: No weakness, tremor, incoordination, spasms, paresthesia or pain. Psychiatric: Denies  confusion, memory loss or sensory loss. Endo: Denies change in weight, skin or hair change.  Heme/Lymph: No excessive bleeding, bruising or enlarged lymph nodes.  Physical Exam  BP 134/84   Pulse 68   Temp 97.7 F (36.5 C)   Resp 18   Ht 5' 6.25" (1.683 m)   Wt (!) 328 lb (148.8 kg)   BMI 52.54 kg/m   Appears over nourished, well groomed  and in no distress.  Eyes: PERRLA, EOMs, conjunctiva no swelling or erythema. Sinuses: No frontal/maxillary tenderness ENT/Mouth: EAC's clear, TM's nl w/o erythema, bulging. Nares clear w/o erythema, swelling, exudates. Oropharynx clear without erythema or exudates. Oral hygiene is good. Tongue normal, non obstructing. Hearing intact.  Neck: Supple. Thyroid nl. Car 2+/2+ without bruits, nodes or JVD. Chest: Respirations nl with BS clear & equal w/o rales, rhonchi, wheezing or stridor.  Cor: Heart sounds normal w/ regular rate and rhythm without sig. murmurs, gallops, clicks or rubs. Peripheral pulses normal and equal  without edema.  Abdomen: Soft & bowel sounds normal. Non-tender w/o guarding, rebound, hernias, masses or organomegaly.  Lymphatics: Unremarkable.  Musculoskeletal: Full ROM all peripheral extremities, joint stability, 5/5 strength and normal gait.  Skin: Warm, dry without exposed rashes, lesions or ecchymosis apparent.  Neuro: Cranial nerves intact, reflexes equal bilaterally. Sensory-motor testing grossly intact. Tendon reflexes grossly intact.  Pysch: Alert & oriented x 3.  Insight and judgement nl & appropriate. No ideations.  Assessment and Plan:  1. Essential hypertension  - Continue medication, monitor blood pressure at home.  - Continue DASH diet. Reminder to go to the ER if any CP,  SOB, nausea, dizziness, severe HA, changes vision/speech. - CBC with Differential/Platelet - BASIC METABOLIC PANEL WITH GFR - Magnesium - TSH  2. Hyperlipidemia, mixed  - Continue diet/meds, exercise,& lifestyle modifications.  -  Continue monitor periodic cholesterol/liver & renal functions  - Hepatic function panel - Lipid panel - TSH  3. PreDiabetes  - Continue diet, exercise, lifestyle modifications.  - Monitor appropriate labs.  - Hemoglobin A1c - Insulin, random  4. Vitamin D deficiency  - Continue supplementation.  - VITAMIN D 25 Hydroxy   5. Gout  - Uric acid  6. Medication management  - CBC with Differential/Platelet - BASIC METABOLIC PANEL WITH GFR - Hepatic function panel - Magnesium - Lipid panel - TSH - Hemoglobin A1c - Insulin, random - VITAMIN D 25 Hydroxy  - Uric acid        Discussed  regular exercise, BP monitoring, weight control to achieve/maintain BMI less than 25 and discussed med and SE's. Recommended labs to assess and monitor clinical status with further disposition pending results of labs. Over 30 minutes of exam, counseling, chart review was performed.

## 2017-02-17 LAB — CBC WITH DIFFERENTIAL/PLATELET
BASOS ABS: 22 {cells}/uL (ref 0–200)
BASOS PCT: 0.4 %
Eosinophils Absolute: 121 cells/uL (ref 15–500)
Eosinophils Relative: 2.2 %
HEMATOCRIT: 37.2 % (ref 35.0–45.0)
Hemoglobin: 12 g/dL (ref 11.7–15.5)
Lymphs Abs: 1843 cells/uL (ref 850–3900)
MCH: 27.5 pg (ref 27.0–33.0)
MCHC: 32.3 g/dL (ref 32.0–36.0)
MCV: 85.3 fL (ref 80.0–100.0)
MPV: 10.5 fL (ref 7.5–12.5)
Monocytes Relative: 7 %
NEUTROS PCT: 56.9 %
Neutro Abs: 3130 cells/uL (ref 1500–7800)
Platelets: 224 10*3/uL (ref 140–400)
RBC: 4.36 10*6/uL (ref 3.80–5.10)
RDW: 13.3 % (ref 11.0–15.0)
TOTAL LYMPHOCYTE: 33.5 %
WBC mixed population: 385 cells/uL (ref 200–950)
WBC: 5.5 10*3/uL (ref 3.8–10.8)

## 2017-02-17 LAB — MAGNESIUM: MAGNESIUM: 2.1 mg/dL (ref 1.5–2.5)

## 2017-02-17 LAB — TSH: TSH: 2.27 m[IU]/L (ref 0.40–4.50)

## 2017-02-17 LAB — HEPATIC FUNCTION PANEL
AG Ratio: 1.2 (calc) (ref 1.0–2.5)
ALT: 15 U/L (ref 6–29)
AST: 19 U/L (ref 10–35)
Albumin: 4.1 g/dL (ref 3.6–5.1)
Alkaline phosphatase (APISO): 114 U/L (ref 33–130)
BILIRUBIN DIRECT: 0.2 mg/dL (ref 0.0–0.2)
BILIRUBIN INDIRECT: 0.9 mg/dL (ref 0.2–1.2)
Globulin: 3.3 g/dL (calc) (ref 1.9–3.7)
TOTAL PROTEIN: 7.4 g/dL (ref 6.1–8.1)
Total Bilirubin: 1.1 mg/dL (ref 0.2–1.2)

## 2017-02-17 LAB — BASIC METABOLIC PANEL WITH GFR
BUN: 13 mg/dL (ref 7–25)
CHLORIDE: 102 mmol/L (ref 98–110)
CO2: 26 mmol/L (ref 20–32)
Calcium: 8.9 mg/dL (ref 8.6–10.4)
Creat: 0.74 mg/dL (ref 0.50–0.99)
GFR, Est African American: 101 mL/min/{1.73_m2} (ref >=60)
GFR, Est Non African American: 87 mL/min/{1.73_m2} (ref >=60)
GLUCOSE: 97 mg/dL (ref 65–99)
Potassium: 4.4 mmol/L (ref 3.5–5.3)
SODIUM: 138 mmol/L (ref 135–146)

## 2017-02-17 LAB — LIPID PANEL
Cholesterol: 176 mg/dL (ref <200)
HDL: 70 mg/dL (ref >50)
LDL CHOLESTEROL (CALC): 91 mg/dL
Non-HDL Cholesterol (Calc): 106 mg/dL (calc) (ref <130)
TRIGLYCERIDES: 60 mg/dL (ref <150)
Total CHOL/HDL Ratio: 2.5 (calc) (ref <5.0)

## 2017-02-17 LAB — URIC ACID: URIC ACID, SERUM: 5.1 mg/dL (ref 2.5–7.0)

## 2017-02-17 LAB — VITAMIN D 25 HYDROXY (VIT D DEFICIENCY, FRACTURES): VIT D 25 HYDROXY: 11 ng/mL — AB (ref 30–100)

## 2017-02-17 LAB — HEMOGLOBIN A1C
HEMOGLOBIN A1C: 5.2 %{Hb} (ref <5.7)
Mean Plasma Glucose: 103 (calc)
eAG (mmol/L): 5.7 (calc)

## 2017-02-17 LAB — INSULIN, RANDOM: INSULIN: 7.8 u[IU]/mL (ref 2.0–19.6)

## 2017-03-07 ENCOUNTER — Other Ambulatory Visit: Payer: Self-pay | Admitting: Internal Medicine

## 2017-03-15 ENCOUNTER — Other Ambulatory Visit: Payer: Self-pay | Admitting: Internal Medicine

## 2017-03-17 ENCOUNTER — Other Ambulatory Visit: Payer: Self-pay | Admitting: Internal Medicine

## 2017-03-17 ENCOUNTER — Telehealth: Payer: Self-pay | Admitting: Internal Medicine

## 2017-03-17 MED ORDER — DEXAMETHASONE 0.5 MG PO TABS: ORAL_TABLET | ORAL | 0 refills | Status: DC

## 2017-03-17 NOTE — Telephone Encounter (Signed)
Patient called c/o shoulder and down back pain for 1 wk, no appt avail til Tuesday. requests recommedation for 1 wk shoulder down back pain. tried advil and heat. [facility].

## 2017-03-21 NOTE — Telephone Encounter (Signed)
Pt aware of Rx that was sent to pharmacy.

## 2017-03-25 ENCOUNTER — Other Ambulatory Visit: Payer: Self-pay | Admitting: Internal Medicine

## 2017-05-22 NOTE — Progress Notes (Signed)
FOLLOW UP  Assessment and Plan:   Hypertension Elevated today in office; increased stress levels - continue bumex, is only taking 4 mg of cardura; increase to 8 mg (2 tabs) Does not tolerate ACE/ARB - if still elevated at next visit consider low dose CCB or BB Monitor blood pressure at home; patient to call if consistently greater than 130/80 Continue DASH diet.   Reminder to go to the ER if any CP, SOB, nausea, dizziness, severe HA, changes vision/speech, left arm numbness and tingling and jaw pain.  Cholesterol Currently managed by lifestyle with LDLs near goal of <100  Continue low cholesterol diet and exercise.  Check lipid panel.   Other abnormal glucose Discussed risks associated with morbid obesity and elevated glucose Continue diet and exercise.  Perform daily foot/skin check, notify office of any concerning changes.  Check A1C  Obesity with co morbidities Long discussion about weight loss, diet, and exercise Recommended diet heavy in fruits and veggies and low in animal meats, cheeses, and dairy products, appropriate calorie intake Discussed ideal weight for height  Patient is unwilling to make lifestyle changes at this time.  Will follow up in 3 months  Vitamin D Def She has not been supplementing despite recommendation; risks discussed - she agrees to initiate 2000 units daily Defer Vit D level  Continue diet and meds as discussed. Further disposition pending results of labs. Discussed med's effects and SE's.   Over 30 minutes of exam, counseling, chart review, and critical decision making was performed.   Future Appointments  Date Time Provider [place]  08/22/2017 10:00 AM Unk Pinto, MD [facility] None    ----------------------------------------------------------------------------------------------------------------------  HPI 61 y.o. female  presents for 3 month follow up on hypertension, cholesterol, history of prediabetes, morbid obesity  (BMI 50+) and vitamin D deficiency. She reports she has been going through increased stress related to jobs changes and now having to drive to [facility] for her job.   BMI is Body mass index is 52.22 kg/m., she has not been working on diet and exercise. Wt Readings from Last 3 Encounters:  05/23/17 (!) 326 lb (147.9 kg)  02/16/17 (!) 328 lb (148.8 kg)  11/16/16 (!) 328 lb 6.4 oz (149 kg)   She reports she has not checked her BP recently, today their BP is BP: (!) 146/90  She does not workout. She denies chest pain, shortness of breath, dizziness.   She is not on cholesterol medication and denies myalgias. Her cholesterol is not at goal. The cholesterol last visit was:   Lab Results  Component Value Date   CHOL 176 02/16/2017   HDL 70 02/16/2017   LDLCALC 103 (H) 11/16/2016   TRIG 60 02/16/2017   CHOLHDL 2.5 02/16/2017    She has not been working on diet and exercise for history of prediabetes, and denies increased appetite, nausea, paresthesia of the feet, polydipsia, polyuria, visual disturbances, vomiting and weight loss. Last A1C in the office was:  Lab Results  Component Value Date   HGBA1C 5.2 02/16/2017   Patient is not on Vitamin D supplement and remains very low at last check:   Lab Results  Component Value Date   VD25OH 11 (L) 02/16/2017       Current Medications:  Current Outpatient Medications on File Prior to Visit  Medication Sig  . allopurinol (ZYLOPRIM) 300 MG tablet TAKE 1 TABLET BY MOUTH EVERY DAY  . aspirin 81 MG tablet Take 81 mg by mouth daily.  . bumetanide (BUMEX)  1 MG tablet Take 1 tablet (1 mg total) by mouth as needed.  Marland Kitchen dexamethasone (DECADRON) 0.5 MG tablet Take 1 tab 3 x day - 3 days, then 2 x day - 3 days, then 1 tab daily  . doxazosin (CARDURA) 4 MG tablet TAKE 1 TO 2 TABLETS AT BEDTIME FOR BLOOD PRESSURE  . gabapentin (NEURONTIN) 600 MG tablet Take 600 mg by mouth 3 (three) times daily.  . naproxen sodium (ANAPROX) 220 MG tablet Take 220  mg by mouth 2 (two) times daily with a meal.  . traMADol (ULTRAM) 50 MG tablet TAKE 1 TABLET BY MOUTH 3 TIMES A DAY ONLY IF NEEDED FOR PAIN  . triamcinolone ointment (KENALOG) 0.1 % Apply 1 application topically 2 (two) times daily.   No current facility-administered medications on file prior to visit.      Allergies:  Allergies  Allergen Reactions  . Ace Inhibitors     Cough  . Losartan   . Paxil [Paroxetine Hcl]     Mood swings  . Prednisone     Nausea/ irratated  . Vitamin D Analogs     Cramping     Medical History:  Past Medical History:  Diagnosis Date  . Anemia   . DDD (degenerative disc disease)   . DJD (degenerative joint disease)   . Elevated hemoglobin A1c   . GERD (gastroesophageal reflux disease)   . Hyperlipidemia   . Hypertension   . Obesity   . Sickle cell trait ([place])    Family history- Reviewed and unchanged Social history- Reviewed and unchanged   Review of Systems:  Review of Systems  Constitutional: Negative for malaise/fatigue and weight loss.  HENT: Negative for hearing loss and tinnitus.   Eyes: Negative for blurred vision and double vision.  Respiratory: Negative for cough, shortness of breath and wheezing.   Cardiovascular: Negative for chest pain, palpitations, orthopnea, claudication and leg swelling.  Gastrointestinal: Negative for abdominal pain, blood in stool, constipation, diarrhea, heartburn, melena, nausea and vomiting.  Genitourinary: Negative.   Musculoskeletal: Negative for joint pain and myalgias.  Skin: Negative for rash.  Neurological: Negative for dizziness, tingling, sensory change, weakness and headaches.  Endo/Heme/Allergies: Negative for polydipsia.  Psychiatric/Behavioral: Negative.  Negative for depression. The patient is not nervous/anxious and does not have insomnia.   All other systems reviewed and are negative.     Physical Exam: BP (!) 146/90   Pulse 80   Temp 97.9 F (36.6 C)   Ht 5' 6.25" (1.683 m)    Wt (!) 326 lb (147.9 kg)   SpO2 98%   BMI 52.22 kg/m  Wt Readings from Last 3 Encounters:  05/23/17 (!) 326 lb (147.9 kg)  02/16/17 (!) 328 lb (148.8 kg)  11/16/16 (!) 328 lb 6.4 oz (149 kg)   General Appearance: Well nourished, morbidly obese, in no apparent distress. Eyes: PERRLA, EOMs, conjunctiva no swelling or erythema Sinuses: No Frontal/maxillary tenderness ENT/Mouth: Ext aud canals clear, TMs without erythema, bulging. No erythema, swelling, or exudate on post pharynx.  Tonsils not swollen or erythematous. Hearing normal.  Neck: Supple, thyroid normal.  Respiratory: Respiratory effort normal, BS equal bilaterally without rales, rhonchi, wheezing or stridor.  Cardio: RRR with no MRGs. Brisk peripheral pulses without edema.  Abdomen: Soft, + BS.  Non tender, no guarding, rebound, hernias, masses. Lymphatics: Non tender without lymphadenopathy.  Musculoskeletal: Full ROM, 5/5 strength, Normal gait Skin: Warm, dry without rashes, lesions, ecchymosis.  Neuro: Cranial nerves intact. No cerebellar symptoms.  Psych:  Awake and oriented X 3, normal affect, Insight and Judgment appropriate.    Izora Ribas, NP 2:38 PM [facility]

## 2017-05-23 ENCOUNTER — Ambulatory Visit: Payer: 59 | Admitting: Adult Health

## 2017-05-23 ENCOUNTER — Encounter: Payer: Self-pay | Admitting: Adult Health

## 2017-05-23 VITALS — BP 146/90 | HR 80 | Temp 97.9°F | Ht 66.25 in | Wt 326.0 lb

## 2017-05-23 DIAGNOSIS — Z6841 Body Mass Index (BMI) 40.0 and over, adult: Secondary | ICD-10-CM

## 2017-05-23 DIAGNOSIS — Z79899 Other long term (current) drug therapy: Secondary | ICD-10-CM

## 2017-05-23 DIAGNOSIS — E782 Mixed hyperlipidemia: Secondary | ICD-10-CM

## 2017-05-23 DIAGNOSIS — E559 Vitamin D deficiency, unspecified: Secondary | ICD-10-CM

## 2017-05-23 DIAGNOSIS — R7309 Other abnormal glucose: Secondary | ICD-10-CM

## 2017-05-23 DIAGNOSIS — I10 Essential (primary) hypertension: Secondary | ICD-10-CM

## 2017-05-23 MED ORDER — GABAPENTIN 600 MG PO TABS: 600.0000 mg | ORAL_TABLET | Freq: Three times a day (TID) | ORAL | 2 refills | Status: DC

## 2017-05-23 NOTE — Patient Instructions (Addendum)
Get some vitamin D! Recommend 2000 units daily -   Start checking BP at home Monitor your blood pressure at home. Go to the ER if any CP, SOB, nausea, dizziness, severe HA, changes vision/speech  Goal BP:  For patients younger than 60: Goal BP < 140/90. For patients 60 and older: Goal BP < 150/90. For patients with diabetes: Goal BP < 140/90. Your most recent BP: BP: (!) 146/90   Take your medications faithfully as instructed. Maintain a healthy weight. Get at least 150 minutes of aerobic exercise per week. Minimize salt intake. Minimize alcohol intake   Really encourage weight loss in 2019 - goal to get under 300lb  Focus on fruits/vegetables - aim for 50% of each meal Eat beans daily for heart health and fiber Use a smaller plate Make sure you drink 80-100 fluid ounces of water daily Avoid drinking calories- sodas/sweet teas/sugar in coffee Start walking 20 min daily  We want weight loss that will last so you should lose 1-2 pounds a week.  THAT IS IT! Please pick THREE things a month to change. Once it is a habit check off the item. Then pick another three items off the list to become habits.  If you are already doing a habit on the list GREAT!  Cross that item off! o Don't drink your calories. Ie, alcohol, soda, fruit juice, and sweet tea.  o Drink more water. Drink a glass when you feel hungry or before each meal.  o Eat breakfast - Complex carb and protein (likeDannon light and fit yogurt, oatmeal, fruit, eggs, [place] bacon). o Measure your cereal.  Eat no more than one cup a day. (ie [place]) o Eat an apple a day. o Add a vegetable a day. o Try a new vegetable a month. o Use Pam! Stop using oil or butter to cook. o Don't finish your plate or use smaller plates. o Share your dessert. o Eat sugar free Jello for dessert or frozen grapes. o Don't eat 2-3 hours before bed. o Switch to whole wheat bread, pasta, and brown rice. o Make healthier choices when you eat out. No  fries! o Pick baked chicken, NOT fried. o Don't forget to SLOW DOWN when you eat. It is not going anywhere.  o Take the stairs. o Park far away in the parking lot o [employer] (or weights) for 10 minutes while watching TV. o Walk at work for 10 minutes during break. o Walk outside 1 time a week with your friend, kids, dog, or significant other. o Start a walking group at [place] the mall as much as you can tolerate.  o Keep a food diary. o Weigh yourself daily. o Walk for 15 minutes 3 days per week. o Cook at home more often and eat out less.  If life happens and you go back to old habits, it is okay.  Just start over. You can do it!   If you experience chest pain, get short of breath, or tired during the exercise, please stop immediately and inform your doctor.

## 2017-05-24 LAB — BASIC METABOLIC PANEL WITH GFR
BUN: 17 mg/dL (ref 7–25)
CO2: 27 mmol/L (ref 20–32)
CREATININE: 0.73 mg/dL (ref 0.50–0.99)
Calcium: 8.9 mg/dL (ref 8.6–10.4)
Chloride: 102 mmol/L (ref 98–110)
GFR, EST NON AFRICAN AMERICAN: 88 mL/min/{1.73_m2} (ref >=60)
GFR, Est African American: 102 mL/min/{1.73_m2} (ref >=60)
Glucose, Bld: 99 mg/dL (ref 65–99)
Potassium: 4.2 mmol/L (ref 3.5–5.3)
SODIUM: 138 mmol/L (ref 135–146)

## 2017-05-24 LAB — HEPATIC FUNCTION PANEL
AG RATIO: 1.3 (calc) (ref 1.0–2.5)
ALBUMIN MSPROF: 4 g/dL (ref 3.6–5.1)
ALKALINE PHOSPHATASE (APISO): 109 U/L (ref 33–130)
ALT: 14 U/L (ref 6–29)
AST: 16 U/L (ref 10–35)
BILIRUBIN TOTAL: 0.7 mg/dL (ref 0.2–1.2)
Bilirubin, Direct: 0.2 mg/dL (ref 0.0–0.2)
Globulin: 3.1 g/dL (calc) (ref 1.9–3.7)
Indirect Bilirubin: 0.5 mg/dL (calc) (ref 0.2–1.2)
Total Protein: 7.1 g/dL (ref 6.1–8.1)

## 2017-05-24 LAB — CBC WITH DIFFERENTIAL/PLATELET
BASOS PCT: 0.6 %
Basophils Absolute: 41 cells/uL (ref 0–200)
EOS ABS: 102 {cells}/uL (ref 15–500)
Eosinophils Relative: 1.5 %
HEMATOCRIT: 35.8 % (ref 35.0–45.0)
HEMOGLOBIN: 12 g/dL (ref 11.7–15.5)
LYMPHS ABS: 2462 {cells}/uL (ref 850–3900)
MCH: 28.4 pg (ref 27.0–33.0)
MCHC: 33.5 g/dL (ref 32.0–36.0)
MCV: 84.6 fL (ref 80.0–100.0)
MPV: 10.3 fL (ref 7.5–12.5)
Monocytes Relative: 6.9 %
Neutro Abs: 3726 cells/uL (ref 1500–7800)
Neutrophils Relative %: 54.8 %
Platelets: 224 10*3/uL (ref 140–400)
RBC: 4.23 10*6/uL (ref 3.80–5.10)
RDW: 12.8 % (ref 11.0–15.0)
Total Lymphocyte: 36.2 %
WBC: 6.8 10*3/uL (ref 3.8–10.8)
WBCMIX: 469 {cells}/uL (ref 200–950)

## 2017-05-24 LAB — HEMOGLOBIN A1C
Hgb A1c MFr Bld: 5.4 % of total Hgb (ref <5.7)
Mean Plasma Glucose: 108 (calc)
eAG (mmol/L): 6 (calc)

## 2017-05-24 LAB — LIPID PANEL
CHOL/HDL RATIO: 2.5 (calc) (ref <5.0)
Cholesterol: 172 mg/dL (ref <200)
HDL: 70 mg/dL (ref >50)
LDL CHOLESTEROL (CALC): 85 mg/dL
NON-HDL CHOLESTEROL (CALC): 102 mg/dL (ref <130)
TRIGLYCERIDES: 83 mg/dL (ref <150)

## 2017-05-24 LAB — TSH: TSH: 3.12 m[IU]/L (ref 0.40–4.50)

## 2017-08-20 ENCOUNTER — Encounter: Payer: Self-pay | Admitting: Internal Medicine

## 2017-08-20 NOTE — Progress Notes (Signed)
[place] [facility] Unk Pinto, M.D.     Uvaldo Bristle. Silverio Lay, P.A.-C Liane Comber, [place] [address] [place] [address] Telephone [phone] Telefax [phone] Annual Screening/Preventative Visit & Comprehensive Evaluation &  Examination     This very nice 62 y.o. SBF presents for a Screening/Preventative Visit & comprehensive evaluation and management of multiple medical co-morbidities.  Patient has been followed for HTN, HLD, Morbid Obesity, Prediabetes  and Vitamin D Deficiency.She also has Gout controlled on her meds. Patient has seen Dr Mina Marble for he back & Rt sciatica with last EDSI about April 2017.       HTN predates circa 1982. Patient's BP has been controlled at home and patient denies any cardiac symptoms as chest pain, palpitations, shortness of breath, dizziness or ankle swelling. Today's BP is borderline elevated at 142/90  (confirmed).     Patient's hyperlipidemia is controlled with diet and medications. Patient denies myalgias or other medication SE's. Last lipids were at goal:  Lab Results  Component Value Date   CHOL 172 05/23/2017   HDL 70 05/23/2017   LDLCALC 85 05/23/2017   TRIG 83 05/23/2017   CHOLHDL 2.5 05/23/2017      Patient has Morbid Obesity (BMI  53+) and prediabetes (A1c 5.9%/2011) and patient denies reactive hypoglycemic symptoms, visual blurring, diabetic polys, or paresthesias. Last A1c was normal & at goal: Lab Results  Component Value Date   HGBA1C 5.4 05/23/2017      Finally, patient has history of Vitamin D Deficiency ("18"/2008)  and since she alleges intolerance Vitamin D was still not at goal: Lab Results  Component Value Date   VD25OH 11 (L) 02/16/2017   Current Outpatient Medications on File Prior to Visit  Medication Sig  . allopurinol (ZYLOPRIM) 300 MG tablet TAKE 1 TABLET BY MOUTH EVERY DAY  . aspirin 81 MG tablet Take 81 mg by mouth daily.  .  bumetanide (BUMEX) 1 MG tablet Take 1 tablet (1 mg total) by mouth as needed.  . doxazosin (CARDURA) 4 MG tablet TAKE 1 TO 2 TABLETS AT BEDTIME FOR BLOOD PRESSURE  . gabapentin (NEURONTIN) 600 MG tablet Take 1 tablet (600 mg total) by mouth 3 (three) times daily.  . naproxen sodium (ANAPROX) 220 MG tablet Take 220 mg by mouth 2 (two) times daily with a meal.  . traMADol (ULTRAM) 50 MG tablet TAKE 1 TABLET BY MOUTH 3 TIMES A DAY ONLY IF NEEDED FOR PAIN   No current facility-administered medications on file prior to visit.    Allergies  Allergen Reactions  . Ace Inhibitors     Cough  . Losartan   . Paxil [Paroxetine Hcl]     Mood swings  . Prednisone     Nausea/ irratated  . Vitamin D Analogs     Cramping   Past Medical History:  Diagnosis Date  . Anemia   . DDD (degenerative disc disease)   . DJD (degenerative joint disease)   . Elevated hemoglobin A1c   . GERD (gastroesophageal reflux disease)   . Hyperlipidemia   . Hypertension   . Obesity   . Sickle cell trait [facility])    Health Maintenance  Topic Date Due  . PAP SMEAR  06/09/1975  . COLONOSCOPY  06/08/2004  . INFLUENZA VACCINE  01/23/2018 (Originally 12/22/2017)  . MAMMOGRAM  07/29/2018  . TETANUS/TDAP  06/30/2025  . Hepatitis C Screening  Completed  . HIV Screening  Completed   Immunization History  Administered Date(s) Administered  . PPD Test 05/02/2013, 06/18/2014, 07/27/2016  . Td 07/01/2015   Last Colon -   Feb 2011 - Dr Amedeo Plenty recc 10 yr f/u Feb 2021.   Last MGM - 03.07.2018 and scheduling f/u w/ Dr Ulanda Edison.  Past Surgical History:  Procedure Laterality Date  . SHOULDER SURGERY Left 2007   Family History  Problem Relation Age of Onset  . Heart disease Mother   . Cancer Father        prostate  . Multiple myeloma Father   . Diabetes Sister   . Mental illness Sister   . Diabetes Brother   . Obstructive Sleep Apnea Brother    Social History   Tobacco Use  . Smoking status: Never Smoker  . Smokeless  tobacco: Never Used  Substance Use Topics  . Alcohol use: Yes    Alcohol/week: 3.0 oz    Types: 6 Standard drinks or equivalent per week  . Drug use: Not on file    ROS Constitutional: Denies fever, chills, weight loss/gain, headaches, insomnia,  night sweats, and change in appetite. Does c/o fatigue. Eyes: Denies redness, blurred vision, diplopia, discharge, itchy, watery eyes.  ENT: Denies discharge, congestion, post nasal drip, epistaxis, sore throat, earache, hearing loss, dental pain, Tinnitus, Vertigo, Sinus pain, snoring.  Cardio: Denies chest pain, palpitations, irregular heartbeat, syncope, dyspnea, diaphoresis, orthopnea, PND, claudication, edema Respiratory: denies cough, dyspnea, DOE, pleurisy, hoarseness, laryngitis, wheezing.  Gastrointestinal: Denies dysphagia, heartburn, reflux, water brash, pain, cramps, nausea, vomiting, bloating, diarrhea, constipation, hematemesis, melena, hematochezia, jaundice, hemorrhoids Genitourinary: Denies dysuria, frequency, urgency, nocturia, hesitancy, discharge, hematuria, flank pain Breast: Breast lumps, nipple discharge, bleeding.  Musculoskeletal: Denies arthralgia, myalgia, stiffness, Jt. Swelling, pain, limp, and strain/sprain. Denies falls. Skin: Denies puritis, rash, hives, warts, acne, eczema, changing in skin lesion Neuro: No weakness, tremor, incoordination, spasms, paresthesia, pain Psychiatric: Denies confusion, memory loss, sensory loss. Denies Depression. Endocrine: Denies change in weight, skin, hair change, nocturia, and paresthesia, diabetic polys, visual blurring, hyper / hypo glycemic episodes.  Heme/Lymph: No excessive bleeding, bruising, enlarged lymph nodes.  Physical Exam  BP (!) 142/90   Pulse 80   Temp (!) 97.3 F (36.3 C)   Resp 18   Ht 5' 7.25" (1.708 m)   Wt (!) 326 lb 6.4 oz (148.1 kg)   BMI 50.74 kg/m   General Appearance: Well nourished, well groomed and in no apparent distress.  Eyes: PERRLA, EOMs,  conjunctiva no swelling or erythema, normal fundi and vessels. Sinuses: No frontal/maxillary tenderness ENT/Mouth: EACs patent / TMs  nl. Nares clear without erythema, swelling, mucoid exudates. Oral hygiene is good. No erythema, swelling, or exudate. Tongue normal, non-obstructing. Tonsils not swollen or erythematous. Hearing normal.  Neck: Supple, thyroid not palpable. No bruits, nodes or JVD. Respiratory: Respiratory effort normal.  BS equal and clear bilateral without rales, rhonci, wheezing or stridor. Cardio: Heart sounds are normal with regular rate and rhythm and no murmurs, rubs or gallops. Peripheral pulses are normal and equal bilaterally without edema. No aortic or femoral bruits. Chest: symmetric with normal excursions and percussion. Breasts: Symmetric, without lumps, nipple discharge, retractions, or fibrocystic changes.  Abdomen: Flat, soft with bowel sounds active. Nontender, no guarding, rebound, hernias, masses, or organomegaly.  Lymphatics: Non tender without lymphadenopathy.  Genitourinary:  Musculoskeletal: Full ROM all peripheral extremities, joint stability, 5/5 strength, and normal gait. Skin: Warm and dry without rashes, lesions, cyanosis, clubbing or  ecchymosis.  Neuro: Cranial nerves intact, reflexes equal bilaterally. Normal muscle tone, no  cerebellar symptoms. Sensation intact.  Pysch: Alert and oriented X 3, normal affect, Insight and Judgment appropriate.   Assessment and Plan  1. Annual Preventative Screening Examination  1. Encounter for general adult medical examination with abnormal findings   2. Essential hypertension  - EKG 12-Lead - [place], RETROPERITNL ABD,  LTD - Urinalysis, Routine w reflex microscopic - Microalbumin / creatinine urine ratio - CBC with Differential/Platelet - BASIC METABOLIC PANEL WITH GFR - Magnesium - TSH  3. Hyperlipidemia, mixed  - EKG 12-Lead - [place], RETROPERITNL ABD,  LTD - Hepatic function panel - Lipid panel -  TSH  4. PreDiabetes  - EKG 12-Lead - [place], RETROPERITNL ABD,  LTD - Hemoglobin A1c - Insulin, random  5. Vitamin D deficiency  - VITAMIN D 25 Hydroxyl  6. Gout  - Uric acid  7. Screening for colorectal cancer  - POC Hemoccult Bld/Stl   8. Obesity, morbid ([place])   9. Screening for ischemic heart disease  - EKG 12-Lead  10. Screening for AAA (aortic abdominal aneurysm)  - [place], RETROPERITNL ABD,  LTD  11. FHx: heart disease  - EKG 12-Lead - [place], RETROPERITNL ABD,  LTD  12. Screening examination for pulmonary tuberculosis   13. Fatigue, unspecified type  - Iron,Total/Total Iron Binding Cap - Vitamin B12 - TSH  14. Medication management  - Urinalysis, Routine w reflex microscopic - Microalbumin / creatinine urine ratio - Uric acid - CBC with Differential/Platelet - BASIC METABOLIC PANEL WITH GFR - Hepatic function panel - Magnesium - Lipid panel - TSH - Hemoglobin A1c - Insulin, random - VITAMIN D 25 Hydroxyl           Patient was counseled in prudent diet to achieve/maintain BMI less than 25 for weight control, BP monitoring, regular exercise and medications. Discussed med's effects and SE's. Screening labs and tests as requested with regular follow-up as recommended. Over 40 minutes of exam, counseling, chart review and high complex critical decision making was performed.

## 2017-08-20 NOTE — Patient Instructions (Signed)

## 2017-08-22 ENCOUNTER — Ambulatory Visit: Payer: 59 | Admitting: Internal Medicine

## 2017-08-22 ENCOUNTER — Other Ambulatory Visit: Payer: Self-pay | Admitting: Internal Medicine

## 2017-08-22 VITALS — BP 142/90 | HR 80 | Temp 97.3°F | Resp 18 | Ht 67.25 in | Wt 326.4 lb

## 2017-08-22 DIAGNOSIS — M109 Gout, unspecified: Secondary | ICD-10-CM

## 2017-08-22 DIAGNOSIS — Z0000 Encounter for general adult medical examination without abnormal findings: Secondary | ICD-10-CM

## 2017-08-22 DIAGNOSIS — Z79899 Other long term (current) drug therapy: Secondary | ICD-10-CM | POA: Diagnosis not present

## 2017-08-22 DIAGNOSIS — I10 Essential (primary) hypertension: Secondary | ICD-10-CM

## 2017-08-22 DIAGNOSIS — Z111 Encounter for screening for respiratory tuberculosis: Secondary | ICD-10-CM | POA: Diagnosis not present

## 2017-08-22 DIAGNOSIS — E782 Mixed hyperlipidemia: Secondary | ICD-10-CM | POA: Diagnosis not present

## 2017-08-22 DIAGNOSIS — Z136 Encounter for screening for cardiovascular disorders: Secondary | ICD-10-CM | POA: Diagnosis not present

## 2017-08-22 DIAGNOSIS — R5383 Other fatigue: Secondary | ICD-10-CM

## 2017-08-22 DIAGNOSIS — Z1212 Encounter for screening for malignant neoplasm of rectum: Principal | ICD-10-CM

## 2017-08-22 DIAGNOSIS — Z1211 Encounter for screening for malignant neoplasm of colon: Secondary | ICD-10-CM

## 2017-08-22 DIAGNOSIS — E559 Vitamin D deficiency, unspecified: Secondary | ICD-10-CM

## 2017-08-22 DIAGNOSIS — Z0001 Encounter for general adult medical examination with abnormal findings: Secondary | ICD-10-CM

## 2017-08-22 DIAGNOSIS — R7309 Other abnormal glucose: Secondary | ICD-10-CM

## 2017-08-22 DIAGNOSIS — Z8249 Family history of ischemic heart disease and other diseases of the circulatory system: Secondary | ICD-10-CM

## 2017-08-23 ENCOUNTER — Other Ambulatory Visit: Payer: Self-pay | Admitting: Internal Medicine

## 2017-08-23 DIAGNOSIS — N3090 Cystitis, unspecified without hematuria: Secondary | ICD-10-CM

## 2017-08-23 LAB — URINALYSIS, ROUTINE W REFLEX MICROSCOPIC
BACTERIA UA: NONE SEEN /HPF
BILIRUBIN URINE: NEGATIVE
Glucose, UA: NEGATIVE
Hyaline Cast: NONE SEEN /LPF
KETONES UR: NEGATIVE
Nitrite: NEGATIVE
PROTEIN: NEGATIVE
RBC / HPF: NONE SEEN /HPF (ref 0–2)
SPECIFIC GRAVITY, URINE: 1.015 (ref 1.001–1.03)
pH: <=5 (ref 5.0–8.0)

## 2017-08-23 LAB — LIPID PANEL
Cholesterol: 183 mg/dL (ref <200)
HDL: 67 mg/dL (ref >50)
LDL Cholesterol (Calc): 99 mg/dL (calc)
Non-HDL Cholesterol (Calc): 116 mg/dL (calc) (ref <130)
TRIGLYCERIDES: 83 mg/dL (ref <150)
Total CHOL/HDL Ratio: 2.7 (calc) (ref <5.0)

## 2017-08-23 LAB — CBC WITH DIFFERENTIAL/PLATELET
BASOS PCT: 0.5 %
Basophils Absolute: 29 cells/uL (ref 0–200)
EOS ABS: 110 {cells}/uL (ref 15–500)
Eosinophils Relative: 1.9 %
HCT: 39 % (ref 35.0–45.0)
HEMOGLOBIN: 12.8 g/dL (ref 11.7–15.5)
Lymphs Abs: 1833 cells/uL (ref 850–3900)
MCH: 27.7 pg (ref 27.0–33.0)
MCHC: 32.8 g/dL (ref 32.0–36.0)
MCV: 84.4 fL (ref 80.0–100.0)
MONOS PCT: 6.6 %
MPV: 10.5 fL (ref 7.5–12.5)
NEUTROS ABS: 3445 {cells}/uL (ref 1500–7800)
Neutrophils Relative %: 59.4 %
PLATELETS: 237 10*3/uL (ref 140–400)
RBC: 4.62 10*6/uL (ref 3.80–5.10)
RDW: 13.1 % (ref 11.0–15.0)
TOTAL LYMPHOCYTE: 31.6 %
WBC: 5.8 10*3/uL (ref 3.8–10.8)
WBCMIX: 383 {cells}/uL (ref 200–950)

## 2017-08-23 LAB — IRON, TOTAL/TOTAL IRON BINDING CAP
%SAT: 18 % (calc) (ref 11–50)
Iron: 67 ug/dL (ref 45–160)
TIBC: 366 mcg/dL (calc) (ref 250–450)

## 2017-08-23 LAB — MAGNESIUM: MAGNESIUM: 2 mg/dL (ref 1.5–2.5)

## 2017-08-23 LAB — BASIC METABOLIC PANEL WITH GFR
BUN: 15 mg/dL (ref 7–25)
CHLORIDE: 104 mmol/L (ref 98–110)
CO2: 27 mmol/L (ref 20–32)
Calcium: 9.1 mg/dL (ref 8.6–10.4)
Creat: 0.9 mg/dL (ref 0.50–0.99)
GFR, EST AFRICAN AMERICAN: 79 mL/min/{1.73_m2} (ref >=60)
GFR, Est Non African American: 68 mL/min/{1.73_m2} (ref >=60)
Glucose, Bld: 103 mg/dL — ABNORMAL HIGH (ref 65–99)
POTASSIUM: 4.4 mmol/L (ref 3.5–5.3)
Sodium: 140 mmol/L (ref 135–146)

## 2017-08-23 LAB — VITAMIN D 25 HYDROXY (VIT D DEFICIENCY, FRACTURES): VIT D 25 HYDROXY: 12 ng/mL — AB (ref 30–100)

## 2017-08-23 LAB — URIC ACID: URIC ACID, SERUM: 4.7 mg/dL (ref 2.5–7.0)

## 2017-08-23 LAB — HEMOGLOBIN A1C
EAG (MMOL/L): 5.8 (calc)
Hgb A1c MFr Bld: 5.3 % of total Hgb (ref <5.7)
Mean Plasma Glucose: 105 (calc)

## 2017-08-23 LAB — HEPATIC FUNCTION PANEL
AG Ratio: 1.3 (calc) (ref 1.0–2.5)
ALBUMIN MSPROF: 4.2 g/dL (ref 3.6–5.1)
ALKALINE PHOSPHATASE (APISO): 117 U/L (ref 33–130)
ALT: 16 U/L (ref 6–29)
AST: 17 U/L (ref 10–35)
BILIRUBIN DIRECT: 0.2 mg/dL (ref 0.0–0.2)
Globulin: 3.2 g/dL (calc) (ref 1.9–3.7)
Indirect Bilirubin: 0.7 mg/dL (calc) (ref 0.2–1.2)
Total Bilirubin: 0.9 mg/dL (ref 0.2–1.2)
Total Protein: 7.4 g/dL (ref 6.1–8.1)

## 2017-08-23 LAB — MICROALBUMIN / CREATININE URINE RATIO
Creatinine, Urine: 103 mg/dL (ref 20–275)
Microalb Creat Ratio: 17 mcg/mg creat (ref <30)
Microalb, Ur: 1.8 mg/dL

## 2017-08-23 LAB — VITAMIN B12: VITAMIN B 12: 593 pg/mL (ref 200–1100)

## 2017-08-23 LAB — TSH: TSH: 2.73 m[IU]/L (ref 0.40–4.50)

## 2017-08-23 LAB — INSULIN, RANDOM: INSULIN: 10.8 u[IU]/mL (ref 2.0–19.6)

## 2017-08-23 MED ORDER — CIPROFLOXACIN HCL 250 MG PO TABS: ORAL_TABLET | ORAL | 0 refills | Status: DC

## 2017-08-24 LAB — TB SKIN TEST
Induration: 0 mm
TB Skin Test: NEGATIVE

## 2017-09-16 ENCOUNTER — Other Ambulatory Visit: Payer: Self-pay | Admitting: Obstetrics and Gynecology

## 2017-09-16 DIAGNOSIS — Z1231 Encounter for screening mammogram for malignant neoplasm of breast: Secondary | ICD-10-CM

## 2017-09-23 ENCOUNTER — Other Ambulatory Visit: Payer: 59

## 2017-09-23 DIAGNOSIS — N3090 Cystitis, unspecified without hematuria: Secondary | ICD-10-CM | POA: Diagnosis not present

## 2017-09-23 LAB — URINALYSIS, ROUTINE W REFLEX MICROSCOPIC
BILIRUBIN URINE: NEGATIVE
Bacteria, UA: NONE SEEN /HPF
GLUCOSE, UA: NEGATIVE
Hyaline Cast: NONE SEEN /LPF
KETONES UR: NEGATIVE
NITRITE: NEGATIVE
PROTEIN: NEGATIVE
Specific Gravity, Urine: 1.012 (ref 1.001–1.03)

## 2017-09-24 LAB — URINE CULTURE
MICRO NUMBER: 90544449
RESULT: NO GROWTH
SPECIMEN QUALITY:: ADEQUATE

## 2017-10-07 ENCOUNTER — Ambulatory Visit
Admission: RE | Admit: 2017-10-07 | Discharge: 2017-10-07 | Disposition: A | Discharge status: Home / Self Care | Payer: 59 | Source: Ambulatory Visit | Attending: Obstetrics and Gynecology | Admitting: Obstetrics and Gynecology

## 2017-10-07 DIAGNOSIS — Z1231 Encounter for screening mammogram for malignant neoplasm of breast: Secondary | ICD-10-CM

## 2017-10-12 ENCOUNTER — Other Ambulatory Visit: Payer: Self-pay | Admitting: Physician Assistant

## 2017-12-19 NOTE — Progress Notes (Deleted)
FOLLOW UP  Assessment and Plan:   Hypertension Continue medications Monitor blood pressure at home; patient to call if consistently greater than 130/80 Continue DASH diet.   Reminder to go to the ER if any CP, SOB, nausea, dizziness, severe HA, changes vision/speech, left arm numbness and tingling and jaw pain.  Cholesterol Currently managed by lifestyle with LDLs at goal of LDL <100 Continue low cholesterol diet and exercise.  Check lipid panel.   Other abnormal glucose Discussed risks associated with morbid obesity and elevated glucose Continue diet and exercise.  Perform daily foot/skin check, notify office of any concerning changes.  Defer A1C to next visit; check CMP/GFR  Obesity with co morbidities Long discussion about weight loss, diet, and exercise Recommended diet heavy in fruits and veggies and low in animal meats, cheeses, and dairy products, appropriate calorie intake Discussed ideal weight for height  Patient is unwilling to make lifestyle changes at this time.  Will follow up in 3 months  Vitamin D Def She has not been supplementing despite recommendation; risks discussed - she agrees to initiate 2000 units daily *** Defer Vit D level  Continue diet and meds as discussed. Further disposition pending results of labs. Discussed med's effects and SE's.   Over 30 minutes of exam, counseling, chart review, and critical decision making was performed.   Future Appointments  Date Time Provider [place]  12/20/2017 10:30 AM Liane Comber, NP [facility] None  03/23/2018 10:30 AM Unk Pinto, MD [facility] None  09/12/2018  9:00 AM Unk Pinto, MD [facility] None    ----------------------------------------------------------------------------------------------------------------------  HPI 62 y.o. female  presents for 3 month follow up on hypertension, cholesterol, history of prediabetes, morbid obesity (BMI 50+) and vitamin D deficiency. She  reports she has been going through increased stress related to jobs changes and now having to drive to [employer] [facility] for her job. ***  BMI is There is no height or weight on file to calculate BMI., she has not been working on diet and exercise. Wt Readings from Last 3 Encounters:  08/22/17 (!) 326 lb 6.4 oz (148.1 kg)  05/23/17 (!) 326 lb (147.9 kg)  02/16/17 (!) 328 lb (148.8 kg)   She reports she has not checked her BP recently, today their BP is    She does not workout. She denies chest pain, shortness of breath, dizziness.   She is not on cholesterol medication and denies myalgias. Her cholesterol is at goal. The cholesterol last visit was:   Lab Results  Component Value Date   CHOL 183 08/22/2017   HDL 67 08/22/2017   LDLCALC 99 08/22/2017   TRIG 83 08/22/2017   CHOLHDL 2.7 08/22/2017    She has not been working on diet and exercise for history of prediabetes, and denies increased appetite, nausea, paresthesia of the feet, polydipsia, polyuria, visual disturbances, vomiting and weight loss. Last A1C in the office was:  Lab Results  Component Value Date   HGBA1C 5.3 08/22/2017   Patient is not on Vitamin D supplement and remains very low at last check:   Lab Results  Component Value Date   VD25OH 12 (L) 08/22/2017       Current Medications:  Current Outpatient Medications on File Prior to Visit  Medication Sig  . allopurinol (ZYLOPRIM) 300 MG tablet TAKE 1 TABLET BY MOUTH EVERY DAY  . aspirin 81 MG tablet Take 81 mg by mouth daily.  . bumetanide (BUMEX) 1 MG tablet Take 1 tablet (1 mg total) by mouth  as needed.  . ciprofloxacin (CIPRO) 250 MG tablet Take 1 tablet 2 x / day with food for Infection  . doxazosin (CARDURA) 4 MG tablet TAKE 1 TO 2 TABLETS AT BEDTIME FOR BLOOD PRESSURE  . gabapentin (NEURONTIN) 600 MG tablet Take 1 tablet (600 mg total) by mouth 3 (three) times daily.  . naproxen sodium (ANAPROX) 220 MG tablet Take 220 mg by mouth 2 (two) times daily with a  meal.  . traMADol (ULTRAM) 50 MG tablet TAKE 1 TABLET BY MOUTH 3 TIMES A DAY ONLY IF NEEDED FOR PAIN   No current facility-administered medications on file prior to visit.      Allergies:  Allergies  Allergen Reactions  . Ace Inhibitors     Cough  . Losartan   . Paxil [Paroxetine Hcl]     Mood swings  . Prednisone     Nausea/ irratated  . Vitamin D Analogs     Cramping     Medical History:  Past Medical History:  Diagnosis Date  . Anemia   . DDD (degenerative disc disease)   . DJD (degenerative joint disease)   . Elevated hemoglobin A1c   . GERD (gastroesophageal reflux disease)   . Hyperlipidemia   . Hypertension   . Obesity   . Sickle cell trait ([place])    Family history- Reviewed and unchanged Social history- Reviewed and unchanged   Review of Systems:  Review of Systems  Constitutional: Negative for malaise/fatigue and weight loss.  HENT: Negative for hearing loss and tinnitus.   Eyes: Negative for blurred vision and double vision.  Respiratory: Negative for cough, shortness of breath and wheezing.   Cardiovascular: Negative for chest pain, palpitations, orthopnea, claudication and leg swelling.  Gastrointestinal: Negative for abdominal pain, blood in stool, constipation, diarrhea, heartburn, melena, nausea and vomiting.  Genitourinary: Negative.   Musculoskeletal: Negative for joint pain and myalgias.  Skin: Negative for rash.  Neurological: Negative for dizziness, tingling, sensory change, weakness and headaches.  Endo/Heme/Allergies: Negative for polydipsia.  Psychiatric/Behavioral: Negative.  Negative for depression. The patient is not nervous/anxious and does not have insomnia.   All other systems reviewed and are negative.   Physical Exam: There were no vitals taken for this visit. Wt Readings from Last 3 Encounters:  08/22/17 (!) 326 lb 6.4 oz (148.1 kg)  05/23/17 (!) 326 lb (147.9 kg)  02/16/17 (!) 328 lb (148.8 kg)   General Appearance: Well  nourished, morbidly obese, in no apparent distress. Eyes: PERRLA, EOMs, conjunctiva no swelling or erythema Sinuses: No Frontal/maxillary tenderness ENT/Mouth: Ext aud canals clear, TMs without erythema, bulging. No erythema, swelling, or exudate on post pharynx.  Tonsils not swollen or erythematous. Hearing normal.  Neck: Supple, thyroid normal.  Respiratory: Respiratory effort normal, BS equal bilaterally without rales, rhonchi, wheezing or stridor.  Cardio: RRR with no MRGs. Brisk peripheral pulses without edema.  Abdomen: Soft, + BS.  Non tender, no guarding, rebound, hernias, masses. Lymphatics: Non tender without lymphadenopathy.  Musculoskeletal: Full ROM, 5/5 strength, Normal gait Skin: Warm, dry without rashes, lesions, ecchymosis.  Neuro: Cranial nerves intact. No cerebellar symptoms.  Psych: Awake and oriented X 3, normal affect, Insight and Judgment appropriate.    Izora Ribas, NP 8:42 AM [facility]

## 2017-12-20 ENCOUNTER — Ambulatory Visit: Payer: Self-pay | Admitting: Adult Health

## 2018-01-17 NOTE — Progress Notes (Deleted)
FOLLOW UP  Assessment and Plan:   Hypertension Continue medications Monitor blood pressure at home; patient to call if consistently greater than 130/80 Continue DASH diet.   Reminder to go to the ER if any CP, SOB, nausea, dizziness, severe HA, changes vision/speech, left arm numbness and tingling and jaw pain.  Cholesterol Currently managed by lifestyle with LDLs at goal of LDL <100 Continue low cholesterol diet and exercise.  Check lipid panel.   Other abnormal glucose Discussed risks associated with morbid obesity and elevated glucose Continue diet and exercise.  Perform daily foot/skin check, notify office of any concerning changes.  Defer A1C to next visit; check CMP/GFR  Obesity with co morbidities Long discussion about weight loss, diet, and exercise Recommended diet heavy in fruits and veggies and low in animal meats, cheeses, and dairy products, appropriate calorie intake Discussed ideal weight for height  Patient is unwilling to make lifestyle changes at this time.  Will follow up in 3 months  Vitamin D Def She has not been supplementing despite recommendation; risks discussed - she agrees to initiate 2000 units daily *** Defer Vit D level  Continue diet and meds as discussed. Further disposition pending results of labs. Discussed med's effects and SE's.   Over 30 minutes of exam, counseling, chart review, and critical decision making was performed.   Future Appointments  Date Time Provider [place]  01/18/2018 10:00 AM Liane Comber, NP [facility] None  03/23/2018 10:30 AM Unk Pinto, MD [facility] None  09/12/2018  9:00 AM Unk Pinto, MD [facility] None    ----------------------------------------------------------------------------------------------------------------------  HPI 62 y.o. female  presents for 3 month follow up on hypertension, cholesterol, history of prediabetes, morbid obesity (BMI 50+) and vitamin D deficiency. She  reports she has been going through increased stress related to jobs changes and now having to drive to [facility] for her job. ***  BMI is There is no height or weight on file to calculate BMI., she has not been working on diet and exercise. Wt Readings from Last 3 Encounters:  08/22/17 (!) 326 lb 6.4 oz (148.1 kg)  05/23/17 (!) 326 lb (147.9 kg)  02/16/17 (!) 328 lb (148.8 kg)   She reports she has not checked her BP recently, today their BP is    She does not workout. She denies chest pain, shortness of breath, dizziness.   She is not on cholesterol medication and denies myalgias. Her cholesterol is at goal. The cholesterol last visit was:   Lab Results  Component Value Date   CHOL 183 08/22/2017   HDL 67 08/22/2017   LDLCALC 99 08/22/2017   TRIG 83 08/22/2017   CHOLHDL 2.7 08/22/2017    She has not been working on diet and exercise for history of prediabetes, and denies increased appetite, nausea, paresthesia of the feet, polydipsia, polyuria, visual disturbances, vomiting and weight loss. Last A1C in the office was:  Lab Results  Component Value Date   HGBA1C 5.3 08/22/2017   Patient is not on Vitamin D supplement and remains very low at last check:   Lab Results  Component Value Date   VD25OH 12 (L) 08/22/2017       Current Medications:  Current Outpatient Medications on File Prior to Visit  Medication Sig  . allopurinol (ZYLOPRIM) 300 MG tablet TAKE 1 TABLET BY MOUTH EVERY DAY  . aspirin 81 MG tablet Take 81 mg by mouth daily.  . bumetanide (BUMEX) 1 MG tablet Take 1 tablet (1 mg total) by mouth  as needed.  . ciprofloxacin (CIPRO) 250 MG tablet Take 1 tablet 2 x / day with food for Infection  . doxazosin (CARDURA) 4 MG tablet TAKE 1 TO 2 TABLETS AT BEDTIME FOR BLOOD PRESSURE  . gabapentin (NEURONTIN) 600 MG tablet Take 1 tablet (600 mg total) by mouth 3 (three) times daily.  . naproxen sodium (ANAPROX) 220 MG tablet Take 220 mg by mouth 2 (two) times daily with a  meal.  . traMADol (ULTRAM) 50 MG tablet TAKE 1 TABLET BY MOUTH 3 TIMES A DAY ONLY IF NEEDED FOR PAIN   No current facility-administered medications on file prior to visit.      Allergies:  Allergies  Allergen Reactions  . Ace Inhibitors     Cough  . Losartan   . Paxil [Paroxetine Hcl]     Mood swings  . Prednisone     Nausea/ irratated  . Vitamin D Analogs     Cramping     Medical History:  Past Medical History:  Diagnosis Date  . Anemia   . DDD (degenerative disc disease)   . DJD (degenerative joint disease)   . Elevated hemoglobin A1c   . GERD (gastroesophageal reflux disease)   . Hyperlipidemia   . Hypertension   . Obesity   . Sickle cell trait ([place])    Family history- Reviewed and unchanged Social history- Reviewed and unchanged   Review of Systems:  Review of Systems  Constitutional: Negative for malaise/fatigue and weight loss.  HENT: Negative for hearing loss and tinnitus.   Eyes: Negative for blurred vision and double vision.  Respiratory: Negative for cough, shortness of breath and wheezing.   Cardiovascular: Negative for chest pain, palpitations, orthopnea, claudication and leg swelling.  Gastrointestinal: Negative for abdominal pain, blood in stool, constipation, diarrhea, heartburn, melena, nausea and vomiting.  Genitourinary: Negative.   Musculoskeletal: Negative for joint pain and myalgias.  Skin: Negative for rash.  Neurological: Negative for dizziness, tingling, sensory change, weakness and headaches.  Endo/Heme/Allergies: Negative for polydipsia.  Psychiatric/Behavioral: Negative.  Negative for depression. The patient is not nervous/anxious and does not have insomnia.   All other systems reviewed and are negative.   Physical Exam: There were no vitals taken for this visit. Wt Readings from Last 3 Encounters:  08/22/17 (!) 326 lb 6.4 oz (148.1 kg)  05/23/17 (!) 326 lb (147.9 kg)  02/16/17 (!) 328 lb (148.8 kg)   General Appearance: Well  nourished, morbidly obese, in no apparent distress. Eyes: PERRLA, EOMs, conjunctiva no swelling or erythema Sinuses: No Frontal/maxillary tenderness ENT/Mouth: Ext aud canals clear, TMs without erythema, bulging. No erythema, swelling, or exudate on post pharynx.  Tonsils not swollen or erythematous. Hearing normal.  Neck: Supple, thyroid normal.  Respiratory: Respiratory effort normal, BS equal bilaterally without rales, rhonchi, wheezing or stridor.  Cardio: RRR with no MRGs. Brisk peripheral pulses without edema.  Abdomen: Soft, + BS.  Non tender, no guarding, rebound, hernias, masses. Lymphatics: Non tender without lymphadenopathy.  Musculoskeletal: Full ROM, 5/5 strength, Normal gait Skin: Warm, dry without rashes, lesions, ecchymosis.  Neuro: Cranial nerves intact. No cerebellar symptoms.  Psych: Awake and oriented X 3, normal affect, Insight and Judgment appropriate.    Izora Ribas, NP 8:35 AM [facility]

## 2018-01-18 ENCOUNTER — Ambulatory Visit (INDEPENDENT_AMBULATORY_CARE_PROVIDER_SITE_OTHER): Payer: 59 | Admitting: Adult Health

## 2018-01-18 ENCOUNTER — Encounter: Payer: Self-pay | Admitting: Adult Health

## 2018-01-18 ENCOUNTER — Ambulatory Visit: Payer: Self-pay | Admitting: Adult Health

## 2018-01-18 VITALS — BP 136/86 | HR 80 | Temp 97.5°F | Ht 67.25 in | Wt 333.0 lb

## 2018-01-18 DIAGNOSIS — E782 Mixed hyperlipidemia: Secondary | ICD-10-CM

## 2018-01-18 DIAGNOSIS — I10 Essential (primary) hypertension: Secondary | ICD-10-CM | POA: Diagnosis not present

## 2018-01-18 DIAGNOSIS — R7309 Other abnormal glucose: Secondary | ICD-10-CM | POA: Diagnosis not present

## 2018-01-18 DIAGNOSIS — Z6841 Body Mass Index (BMI) 40.0 and over, adult: Secondary | ICD-10-CM

## 2018-01-18 DIAGNOSIS — Z79899 Other long term (current) drug therapy: Secondary | ICD-10-CM

## 2018-01-18 DIAGNOSIS — E559 Vitamin D deficiency, unspecified: Secondary | ICD-10-CM

## 2018-01-18 NOTE — Progress Notes (Signed)
FOLLOW UP  Assessment and Plan:   Hypertension Somewhat elevated -discussed goal - start checking BP daily - work on lifestyle Monitor blood pressure at home; patient to call if consistently greater than 130/80 Continue DASH diet.   Reminder to go to the ER if any CP, SOB, nausea, dizziness, severe HA, changes vision/speech, left arm numbness and tingling and jaw pain.  Cholesterol Currently managed by lifestyle with LDLs at goal of LDL <100 Continue low cholesterol diet and exercise.  Check lipid panel.   Other abnormal glucose Discussed risks associated with morbid obesity and elevated glucose Continue diet and exercise.  Perform daily foot/skin check, notify office of any concerning changes.  Defer A1C to next visit; check CMP/GFR  Obesity with co morbidities Long discussion about weight loss, diet, and exercise Recommended diet heavy in fruits and veggies and low in animal meats, cheeses, and dairy products, appropriate calorie intake Discussed ideal weight for height  Patient is unwilling to make lifestyle changes at this time. - suggested sleep management, stress reduction, increase fluids, find easy healthy snacks and meals that she can grab and go Weight goal set for 325 lb next visit Will follow up in 3 months  Vitamin D Def She has not been supplementing despite recommendation; risks discussed - she agrees to initiate 2000 units daily.  Defer Vit D level  Continue diet and meds as discussed. Further disposition pending results of labs. Discussed med's effects and SE's.   Over 30 minutes of exam, counseling, chart review, and critical decision making was performed.   Future Appointments  Date Time Provider [place]  03/23/2018 10:30 AM Unk Pinto, MD [facility] None  09/12/2018  9:00 AM Unk Pinto, MD [facility] None     ----------------------------------------------------------------------------------------------------------------------  HPI 62 y.o. female  presents for 3 month follow up on hypertension, cholesterol, history of prediabetes, morbid obesity (BMI 50+) and vitamin D deficiency. She reports she has been going through increased stress related to jobs changes and now having to drive to [facility] for her job.   BMI is Body mass index is 51.77 kg/m., she has not been working on diet and exercise. Wt Readings from Last 3 Encounters:  01/18/18 (!) 333 lb (151 kg)  08/22/17 (!) 326 lb 6.4 oz (148.1 kg)  05/23/17 (!) 326 lb (147.9 kg)   She reports she has not checked her BP recently but bought new BP cuff, today their BP is BP: 136/86  She does not workout. She denies chest pain, shortness of breath, dizziness.   She is not on cholesterol medication and denies myalgias. Her cholesterol is at goal. The cholesterol last visit was:   Lab Results  Component Value Date   CHOL 183 08/22/2017   HDL 67 08/22/2017   LDLCALC 99 08/22/2017   TRIG 83 08/22/2017   CHOLHDL 2.7 08/22/2017    She has not been working on diet and exercise for history of prediabetes, and denies increased appetite, nausea, paresthesia of the feet, polydipsia, polyuria, visual disturbances, vomiting and weight loss. Last A1C in the office was:  Lab Results  Component Value Date   HGBA1C 5.3 08/22/2017   Patient is not on Vitamin D supplement and remains very low at last check:   Lab Results  Component Value Date   VD25OH 12 (L) 08/22/2017       Current Medications:  Current Outpatient Medications on File Prior to Visit  Medication Sig  . allopurinol (ZYLOPRIM) 300 MG tablet TAKE 1 TABLET BY MOUTH  EVERY DAY  . aspirin 81 MG tablet Take 81 mg by mouth daily.  . bumetanide (BUMEX) 1 MG tablet Take 1 tablet (1 mg total) by mouth as needed.  . doxazosin (CARDURA) 4 MG tablet TAKE 1 TO 2 TABLETS AT BEDTIME FOR BLOOD  PRESSURE  . gabapentin (NEURONTIN) 600 MG tablet Take 1 tablet (600 mg total) by mouth 3 (three) times daily.  . naproxen sodium (ANAPROX) 220 MG tablet Take 220 mg by mouth 2 (two) times daily with a meal.   No current facility-administered medications on file prior to visit.      Allergies:  Allergies  Allergen Reactions  . Ace Inhibitors     Cough  . Losartan   . Paxil [Paroxetine Hcl]     Mood swings  . Prednisone     Nausea/ irratated  . Vitamin D Analogs     Cramping     Medical History:  Past Medical History:  Diagnosis Date  . Anemia   . DDD (degenerative disc disease)   . DJD (degenerative joint disease)   . Elevated hemoglobin A1c   . GERD (gastroesophageal reflux disease)   . Hyperlipidemia   . Hypertension   . Obesity   . Sickle cell trait ([place])    Family history- Reviewed and unchanged Social history- Reviewed and unchanged   Review of Systems:  Review of Systems  Constitutional: Negative for malaise/fatigue and weight loss.  HENT: Negative for hearing loss and tinnitus.   Eyes: Negative for blurred vision and double vision.  Respiratory: Negative for cough, shortness of breath and wheezing.   Cardiovascular: Negative for chest pain, palpitations, orthopnea, claudication and leg swelling.  Gastrointestinal: Negative for abdominal pain, blood in stool, constipation, diarrhea, heartburn, melena, nausea and vomiting.  Genitourinary: Negative.   Musculoskeletal: Negative for joint pain and myalgias.  Skin: Negative for rash.  Neurological: Negative for dizziness, tingling, sensory change, weakness and headaches.  Endo/Heme/Allergies: Negative for polydipsia.  Psychiatric/Behavioral: Negative.  Negative for depression. The patient is not nervous/anxious and does not have insomnia.   All other systems reviewed and are negative.   Physical Exam: BP 136/86   Pulse 80   Temp (!) 97.5 F (36.4 C)   Ht 5' 7.25" (1.708 m)   Wt (!) 333 lb (151 kg)   SpO2  99%   BMI 51.77 kg/m  Wt Readings from Last 3 Encounters:  01/18/18 (!) 333 lb (151 kg)  08/22/17 (!) 326 lb 6.4 oz (148.1 kg)  05/23/17 (!) 326 lb (147.9 kg)   General Appearance: Well nourished, morbidly obese, in no apparent distress. Eyes: PERRLA, EOMs, conjunctiva no swelling or erythema Sinuses: No Frontal/maxillary tenderness ENT/Mouth: Ext aud canals clear, TMs without erythema, bulging. No erythema, swelling, or exudate on post pharynx.  Tonsils not swollen or erythematous. Hearing normal.  Neck: Supple, thyroid normal.  Respiratory: Respiratory effort normal, BS equal bilaterally without rales, rhonchi, wheezing or stridor.  Cardio: RRR with no MRGs. Brisk peripheral pulses without edema.  Abdomen: Soft, + BS.  Non tender, no guarding, rebound, hernias, masses. Lymphatics: Non tender without lymphadenopathy.  Musculoskeletal: Full ROM, 5/5 strength, Normal gait Skin: Warm, dry without rashes, lesions, ecchymosis.  Neuro: Cranial nerves intact. No cerebellar symptoms.  Psych: Awake and oriented X 3, normal affect, Insight and Judgment appropriate.    Izora Ribas, NP 10:52 AM [facility]

## 2018-01-18 NOTE — Patient Instructions (Signed)
Goals    . Blood Pressure < 130/80     Start checking daily    . DIET - INCREASE WATER INTAKE     65-80+ fluid ounces of unsweetened beverages daily    . Weight (lb) < 325 lb (147.4 kg)      Plan ahead - find EASY things you can have for breakfast/snacks  Low sugar protein shakes, fruit, nuts (in moderation), greek yogurt (high in protein), sandwhiches (use high fiber whole grain bread)  Avoid temptations such as bagged chips, pastries  Push water  Managed stress - take time to calm down and listen to relaxing music, do deep breathing, take a bath, dance, etc    Can try melatonin 5mg -15 mg at night for sleep, can also do benadryl 25-50mg  at night for sleep.  If this does not help we can try prescription medication.  Also here is some information about good sleep hygiene.   Insomnia Insomnia is frequent trouble falling and/or staying asleep. Insomnia can be a long term problem or a short term problem. Both are common. Insomnia can be a short term problem when the wakefulness is related to a certain stress or worry. Long term insomnia is often related to ongoing stress during waking hours and/or poor sleeping habits. Overtime, sleep deprivation itself can make the problem worse. Every little thing feels more severe because you are overtired and your ability to cope is decreased. CAUSES   Stress, anxiety, and depression.  Poor sleeping habits.  Distractions such as TV in the bedroom.  Naps close to bedtime.  Engaging in emotionally charged conversations before bed.  Technical reading before sleep.  Alcohol and other sedatives. They may make the problem worse. They can hurt normal sleep patterns and normal dream activity.  Stimulants such as caffeine for several hours prior to bedtime.  Pain syndromes and shortness of breath can cause insomnia.  Exercise late at night.  Changing time zones may cause sleeping problems (jet lag). It is sometimes helpful to have someone  observe your sleeping patterns. They should look for periods of not breathing during the night (sleep apnea). They should also look to see how long those periods last. If you live alone or observers are uncertain, you can also be observed at a sleep clinic where your sleep patterns will be professionally monitored. Sleep apnea requires a checkup and treatment. Give your caregivers your medical history. Give your caregivers observations your family has made about your sleep.  SYMPTOMS   Not feeling rested in the morning.  Anxiety and restlessness at bedtime.  Difficulty falling and staying asleep. TREATMENT   Your caregiver may prescribe treatment for an underlying medical disorders. Your caregiver can give advice or help if you are using alcohol or other drugs for self-medication. Treatment of underlying problems will usually eliminate insomnia problems.  Medications can be prescribed for short time use. They are generally not recommended for lengthy use.  Over-the-counter sleep medicines are not recommended for lengthy use. They can be habit forming.  You can promote easier sleeping by making lifestyle changes such as:  Using relaxation techniques that help with breathing and reduce muscle tension.  Exercising earlier in the day.  Changing your diet and the time of your last meal. No night time snacks.  Establish a regular time to go to bed.  Counseling can help with stressful problems and worry.  Soothing music and white noise may be helpful if there are background noises you cannot remove.  Stop tedious  detailed work at least one hour before bedtime. HOME CARE INSTRUCTIONS   Keep a diary. Inform your caregiver about your progress. This includes any medication side effects. See your caregiver regularly. Take note of:  Times when you are asleep.  Times when you are awake during the night.  The quality of your sleep.  How you feel the next day. This information will help  your caregiver care for you.  Get out of bed if you are still awake after 15 minutes. Read or do some quiet activity. Keep the lights down. Wait until you feel sleepy and go back to bed.  Keep regular sleeping and waking hours. Avoid naps.  Exercise regularly.  Avoid distractions at bedtime. Distractions include watching television or engaging in any intense or detailed activity like attempting to balance the household checkbook.  Develop a bedtime ritual. Keep a familiar routine of bathing, brushing your teeth, climbing into bed at the same time each night, listening to soothing music. Routines increase the success of falling to sleep faster.  Use relaxation techniques. This can be using breathing and muscle tension release routines. It can also include visualizing peaceful scenes. You can also help control troubling or intruding thoughts by keeping your mind occupied with boring or repetitive thoughts like the old concept of counting sheep. You can make it more creative like imagining planting one beautiful flower after another in your backyard garden.  During your day, work to eliminate stress. When this is not possible use some of the previous suggestions to help reduce the anxiety that accompanies stressful situations. MAKE SURE YOU:   Understand these instructions.  Will watch your condition.  Will get help right away if you are not doing well or get worse. Document Released: 05/07/2000 Document Revised: 08/02/2011 Document Reviewed: 06/07/2007 [facility] Patient Information 2015 [place], [place]. This information is not intended to replace advice given to you by your health care provider. Make sure you discuss any questions you have with your health care provider.

## 2018-01-19 LAB — CBC WITH DIFFERENTIAL/PLATELET
BASOS PCT: 0.5 %
Basophils Absolute: 30 cells/uL (ref 0–200)
Eosinophils Absolute: 108 cells/uL (ref 15–500)
Eosinophils Relative: 1.8 %
HCT: 38.6 % (ref 35.0–45.0)
HEMOGLOBIN: 12.4 g/dL (ref 11.7–15.5)
Lymphs Abs: 1806 cells/uL (ref 850–3900)
MCH: 28.1 pg (ref 27.0–33.0)
MCHC: 32.1 g/dL (ref 32.0–36.0)
MCV: 87.5 fL (ref 80.0–100.0)
MONOS PCT: 7.5 %
MPV: 10.3 fL (ref 7.5–12.5)
NEUTROS ABS: 3606 {cells}/uL (ref 1500–7800)
Neutrophils Relative %: 60.1 %
Platelets: 224 10*3/uL (ref 140–400)
RBC: 4.41 10*6/uL (ref 3.80–5.10)
RDW: 13.2 % (ref 11.0–15.0)
Total Lymphocyte: 30.1 %
WBC: 6 10*3/uL (ref 3.8–10.8)
WBCMIX: 450 {cells}/uL (ref 200–950)

## 2018-01-19 LAB — COMPLETE METABOLIC PANEL WITH GFR
AG Ratio: 1.1 (calc) (ref 1.0–2.5)
ALBUMIN MSPROF: 4.2 g/dL (ref 3.6–5.1)
ALT: 14 U/L (ref 6–29)
AST: 18 U/L (ref 10–35)
Alkaline phosphatase (APISO): 134 U/L — ABNORMAL HIGH (ref 33–130)
BILIRUBIN TOTAL: 0.8 mg/dL (ref 0.2–1.2)
BUN: 12 mg/dL (ref 7–25)
CALCIUM: 9.3 mg/dL (ref 8.6–10.4)
CHLORIDE: 102 mmol/L (ref 98–110)
CO2: 26 mmol/L (ref 20–32)
Creat: 0.81 mg/dL (ref 0.50–0.99)
GFR, EST AFRICAN AMERICAN: 90 mL/min/{1.73_m2} (ref >=60)
GFR, EST NON AFRICAN AMERICAN: 77 mL/min/{1.73_m2} (ref >=60)
GLUCOSE: 114 mg/dL — AB (ref 65–99)
Globulin: 3.8 g/dL (calc) — ABNORMAL HIGH (ref 1.9–3.7)
Potassium: 4.4 mmol/L (ref 3.5–5.3)
Sodium: 139 mmol/L (ref 135–146)
TOTAL PROTEIN: 8 g/dL (ref 6.1–8.1)

## 2018-01-19 LAB — LIPID PANEL
CHOL/HDL RATIO: 2.6 (calc) (ref <5.0)
Cholesterol: 183 mg/dL (ref <200)
HDL: 70 mg/dL (ref >50)
LDL CHOLESTEROL (CALC): 97 mg/dL
NON-HDL CHOLESTEROL (CALC): 113 mg/dL (ref <130)
Triglycerides: 71 mg/dL (ref <150)

## 2018-01-19 LAB — TSH: TSH: 3.54 m[IU]/L (ref 0.40–4.50)

## 2018-03-23 ENCOUNTER — Ambulatory Visit: Payer: Self-pay | Admitting: Internal Medicine

## 2018-04-23 ENCOUNTER — Encounter: Payer: Self-pay | Admitting: Internal Medicine

## 2018-04-23 DIAGNOSIS — R7309 Other abnormal glucose: Secondary | ICD-10-CM | POA: Insufficient documentation

## 2018-04-23 NOTE — Patient Instructions (Signed)

## 2018-04-23 NOTE — Progress Notes (Signed)
This very nice 62 y.o. SBF presents for 6 month follow up with HTN, HLD, Pre-Diabetes and Vitamin D Deficiency.  Patient is c/o bilat knee pains and her orthopedist is deferring surgical intervention pending bariatric weight loss surgery which she states her insurance company still will not cover. She is amenable to try meds for weight loss.       Patient is treated for HTN (1982) & BP has been controlled at home. Today's BP was initially elevated and rechecked at goal - 134/82. Patient has had no complaints of any cardiac type chest pain, palpitations, dyspnea / orthopnea / PND, dizziness, claudication, or dependent edema. Patient has Gout controlled on her meds. She has DDD and has EDSI's by Dr Mina Marble.      Hyperlipidemia is controlled with diet & meds. Patient denies myalgias or other med SE's. Last Lipids were at goal: Lab Results  Component Value Date   CHOL 183 01/18/2018   HDL 70 01/18/2018   LDLCALC 97 01/18/2018   TRIG 71 01/18/2018   CHOLHDL 2.6 01/18/2018      Also, the patient has history of Morbid Obesity (BMI 50+) and PreDiabetes (A1c 5.9% / 2011)  and has had no symptoms of reactive hypoglycemia, diabetic polys, paresthesias or visual blurring.  Last A1c was Normal & at goal: Lab Results  Component Value Date   HGBA1C 5.3 08/22/2017      Further, the patient also has history of Vitamin D Deficiency ("18" / 2008) and supplements vitamin D without any suspected side-effects. Last vitamin D was  Still very low: Lab Results  Component Value Date   VD25OH 12 (L) 08/22/2017   Current Outpatient Medications on File Prior to Visit  Medication Sig  . allopurinol (ZYLOPRIM) 300 MG tablet TAKE 1 TABLET BY MOUTH EVERY DAY  . aspirin 81 MG tablet Take 81 mg by mouth daily.  . bumetanide (BUMEX) 1 MG tablet Take 1 tablet (1 mg total) by mouth as needed.  . doxazosin (CARDURA) 4 MG tablet TAKE 1 TO 2 TABLETS AT BEDTIME FOR BLOOD PRESSURE  . gabapentin (NEURONTIN) 600 MG tablet Take  1 tablet (600 mg total) by mouth 3 (three) times daily.  . naproxen sodium (ANAPROX) 220 MG tablet Take 220 mg by mouth 2 (two) times daily with a meal.   No current facility-administered medications on file prior to visit.    Allergies  Allergen Reactions  . Ace Inhibitors     Cough  . Losartan   . Paxil [Paroxetine Hcl]     Mood swings  . Prednisone     Nausea/ irratated  . Vitamin D Analogs     Cramping   PMHx:   Past Medical History:  Diagnosis Date  . Anemia   . DDD (degenerative disc disease)   . DJD (degenerative joint disease)   . Elevated hemoglobin A1c   . GERD (gastroesophageal reflux disease)   . Hyperlipidemia   . Hypertension   . Obesity   . Sickle cell trait ([place])    Immunization History  Administered Date(s) Administered  . PPD Test 05/02/2013, 06/18/2014, 07/27/2016, 08/22/2017  . Td 07/01/2015   Past Surgical History:  Procedure Laterality Date  . SHOULDER SURGERY Left 2007   FHx:    Reviewed / unchanged  SHx:    Reviewed / unchanged   Systems Review:  Constitutional: Denies fever, chills, wt changes, headaches, insomnia, fatigue, night sweats, change in appetite. Eyes: Denies redness, blurred vision, diplopia, discharge, itchy,  watery eyes.  ENT: Denies discharge, congestion, post nasal drip, epistaxis, sore throat, earache, hearing loss, dental pain, tinnitus, vertigo, sinus pain, snoring.  CV: Denies chest pain, palpitations, irregular heartbeat, syncope, dyspnea, diaphoresis, orthopnea, PND, claudication or edema. Respiratory: denies cough, dyspnea, DOE, pleurisy, hoarseness, laryngitis, wheezing.  Gastrointestinal: Denies dysphagia, odynophagia, heartburn, reflux, water brash, abdominal pain or cramps, nausea, vomiting, bloating, diarrhea, constipation, hematemesis, melena, hematochezia  or hemorrhoids. Genitourinary: Denies dysuria, frequency, urgency, nocturia, hesitancy, discharge, hematuria or flank pain. Musculoskeletal: Denies  arthralgias, myalgias, stiffness, jt. swelling, pain, limping or strain/sprain.  Skin: Denies pruritus, rash, hives, warts, acne, eczema or change in skin lesion(s). Neuro: No weakness, tremor, incoordination, spasms, paresthesia or pain. Psychiatric: Denies confusion, memory loss or sensory loss. Endo: Denies change in weight, skin or hair change.  Heme/Lymph: No excessive bleeding, bruising or enlarged lymph nodes.  Physical Exam  BP 134/82   Pulse 76   Temp (!) 97.5 F (36.4 C)   Ht 5' 7.25" (1.708 m)   Wt (!) 332 lb 6.4 oz (150.8 kg)   BMI 51.67 kg/m   Appears over nourished, well groomed  and in no distress.  Eyes: PERRLA, EOMs, conjunctiva no swelling or erythema. Sinuses: No frontal/maxillary tenderness ENT/Mouth: EAC's clear, TM's nl w/o erythema, bulging. Nares clear w/o erythema, swelling, exudates. Oropharynx clear without erythema or exudates. Oral hygiene is good. Tongue normal, non obstructing. Hearing intact.  Neck: Supple. Thyroid not palpable. Car 2+/2+ without bruits, nodes or JVD. Chest: Respirations nl with BS clear & equal w/o rales, rhonchi, wheezing or stridor.  Cor: Heart sounds normal w/ regular rate and rhythm without sig. murmurs, gallops, clicks or rubs. Peripheral pulses normal and equal  without edema.  Abdomen: Soft & bowel sounds normal. Non-tender w/o guarding, rebound, hernias, masses or organomegaly.  Lymphatics: Unremarkable.  Musculoskeletal: Full ROM all peripheral extremities, joint stability, 5/5 strength and normal gait.  Skin: Warm, dry without exposed rashes, lesions or ecchymosis apparent.  Neuro: Cranial nerves intact, reflexes equal bilaterally. Sensory-motor testing grossly intact. Tendon reflexes grossly intact.  Pysch: Alert & oriented x 3.  Insight and judgement nl & appropriate. No ideations.  Assessment and Plan:  1. Essential hypertension  - Continue medication, monitor blood pressure at home.  - Continue DASH diet.  Reminder  to go to the ER if any CP,  SOB, nausea, dizziness, severe HA, changes vision/speech.  - CBC with Differential/Platelet - COMPLETE METABOLIC PANEL WITH GFR - Magnesium - TSH  2. Hyperlipidemia, mixed  - Continue diet/meds, exercise,& lifestyle modifications.  - Continue monitor periodic cholesterol/liver & renal functions   - Lipid panel - TSH  3. PreDiabetes  - Continue diet, exercise,  - lifestyle modifications.  - Monitor appropriate labs.  - Hemoglobin A1c - Insulin, random  4. Vitamin D deficiency  - Continue supplementation.  - VITAMIN D 25 Hydroxyl  5. Idiopathic gout  - Uric acid  6. Class 3 severe obesity due to excess calories with serious comorbidity and body mass index (BMI) of 50.0 to 59.9 in adult (HCC)  - Rx - Phentermine 37.5 mg #30 x 5 Rf - take 1/2 -1 tab qam and  - Rx - Topamax 50 mg #180 x 1 Rf - take 1 tab bid at suppertime & bedtime   - Lipid panel - Hemoglobin A1c  7. Medication management  - CBC with Differential/Platelet - COMPLETE METABOLIC PANEL WITH GFR - Magnesium - Lipid panel - TSH - Hemoglobin A1c - Insulin, random - VITAMIN D  25 Hydroxyl - Uric acid       Discussed  regular exercise, BP monitoring, weight control to achieve/maintain BMI less than 25 and discussed med and SE's. Recommended labs to assess and monitor clinical status with further disposition pending results of labs. Over 30 minutes of exam, counseling, chart review was performed.

## 2018-04-24 ENCOUNTER — Encounter: Payer: Self-pay | Admitting: Internal Medicine

## 2018-04-24 ENCOUNTER — Ambulatory Visit: Payer: 59 | Admitting: Internal Medicine

## 2018-04-24 VITALS — BP 134/82 | HR 76 | Temp 97.5°F | Ht 67.25 in | Wt 332.4 lb

## 2018-04-24 DIAGNOSIS — E782 Mixed hyperlipidemia: Secondary | ICD-10-CM | POA: Diagnosis not present

## 2018-04-24 DIAGNOSIS — M1000 Idiopathic gout, unspecified site: Secondary | ICD-10-CM

## 2018-04-24 DIAGNOSIS — I10 Essential (primary) hypertension: Secondary | ICD-10-CM

## 2018-04-24 DIAGNOSIS — E559 Vitamin D deficiency, unspecified: Secondary | ICD-10-CM | POA: Diagnosis not present

## 2018-04-24 DIAGNOSIS — Z79899 Other long term (current) drug therapy: Secondary | ICD-10-CM

## 2018-04-24 DIAGNOSIS — R7309 Other abnormal glucose: Secondary | ICD-10-CM

## 2018-04-24 DIAGNOSIS — Z6841 Body Mass Index (BMI) 40.0 and over, adult: Secondary | ICD-10-CM

## 2018-04-24 MED ORDER — DOXAZOSIN MESYLATE 8 MG PO TABS: ORAL_TABLET | ORAL | 3 refills | Status: DC

## 2018-04-24 MED ORDER — TOPIRAMATE 50 MG PO TABS: ORAL_TABLET | ORAL | 1 refills | Status: DC

## 2018-04-24 MED ORDER — PHENTERMINE HCL 37.5 MG PO TABS: ORAL_TABLET | ORAL | 5 refills | Status: DC

## 2018-04-25 LAB — LIPID PANEL
Cholesterol: 183 mg/dL (ref <200)
HDL: 69 mg/dL (ref >50)
LDL Cholesterol (Calc): 99 mg/dL (calc)
Non-HDL Cholesterol (Calc): 114 mg/dL (calc) (ref <130)
Total CHOL/HDL Ratio: 2.7 (calc) (ref <5.0)
Triglycerides: 66 mg/dL (ref <150)

## 2018-04-25 LAB — CBC WITH DIFFERENTIAL/PLATELET
BASOS PCT: 0.5 %
Basophils Absolute: 33 cells/uL (ref 0–200)
Eosinophils Absolute: 112 cells/uL (ref 15–500)
Eosinophils Relative: 1.7 %
HCT: 36.7 % (ref 35.0–45.0)
Hemoglobin: 12.2 g/dL (ref 11.7–15.5)
Lymphs Abs: 2000 cells/uL (ref 850–3900)
MCH: 28.4 pg (ref 27.0–33.0)
MCHC: 33.2 g/dL (ref 32.0–36.0)
MCV: 85.5 fL (ref 80.0–100.0)
MONOS PCT: 6.1 %
MPV: 10.9 fL (ref 7.5–12.5)
NEUTROS PCT: 61.4 %
Neutro Abs: 4052 cells/uL (ref 1500–7800)
PLATELETS: 217 10*3/uL (ref 140–400)
RBC: 4.29 10*6/uL (ref 3.80–5.10)
RDW: 12.8 % (ref 11.0–15.0)
TOTAL LYMPHOCYTE: 30.3 %
WBC: 6.6 10*3/uL (ref 3.8–10.8)
WBCMIX: 403 {cells}/uL (ref 200–950)

## 2018-04-25 LAB — COMPLETE METABOLIC PANEL WITH GFR
AG RATIO: 1.2 (calc) (ref 1.0–2.5)
ALT: 17 U/L (ref 6–29)
AST: 18 U/L (ref 10–35)
Albumin: 4.1 g/dL (ref 3.6–5.1)
Alkaline phosphatase (APISO): 115 U/L (ref 33–130)
BUN: 16 mg/dL (ref 7–25)
CALCIUM: 9.1 mg/dL (ref 8.6–10.4)
CO2: 26 mmol/L (ref 20–32)
CREATININE: 0.84 mg/dL (ref 0.50–0.99)
Chloride: 102 mmol/L (ref 98–110)
GFR, EST AFRICAN AMERICAN: 86 mL/min/{1.73_m2} (ref >=60)
GFR, EST NON AFRICAN AMERICAN: 74 mL/min/{1.73_m2} (ref >=60)
GLOBULIN: 3.4 g/dL (ref 1.9–3.7)
GLUCOSE: 106 mg/dL — AB (ref 65–99)
POTASSIUM: 4.5 mmol/L (ref 3.5–5.3)
SODIUM: 138 mmol/L (ref 135–146)
TOTAL PROTEIN: 7.5 g/dL (ref 6.1–8.1)
Total Bilirubin: 1.1 mg/dL (ref 0.2–1.2)

## 2018-04-25 LAB — HEMOGLOBIN A1C
Hgb A1c MFr Bld: 5.3 % of total Hgb (ref <5.7)
Mean Plasma Glucose: 105 (calc)
eAG (mmol/L): 5.8 (calc)

## 2018-04-25 LAB — MAGNESIUM: Magnesium: 2 mg/dL (ref 1.5–2.5)

## 2018-04-25 LAB — INSULIN, RANDOM: INSULIN: 11.2 u[IU]/mL (ref 2.0–19.6)

## 2018-04-25 LAB — TSH: TSH: 2.72 mIU/L (ref 0.40–4.50)

## 2018-04-25 LAB — VITAMIN D 25 HYDROXY (VIT D DEFICIENCY, FRACTURES): Vit D, 25-Hydroxy: 9 ng/mL — ABNORMAL LOW (ref 30–100)

## 2018-04-25 LAB — URIC ACID: Uric Acid, Serum: 5 mg/dL (ref 2.5–7.0)

## 2018-06-17 ENCOUNTER — Other Ambulatory Visit: Payer: Self-pay | Admitting: Adult Health

## 2018-07-13 ENCOUNTER — Other Ambulatory Visit: Payer: Self-pay | Admitting: Internal Medicine

## 2018-09-11 ENCOUNTER — Encounter: Payer: Self-pay | Admitting: Internal Medicine

## 2018-09-11 NOTE — Progress Notes (Signed)
[facility] Unk Pinto, M.D.     Uvaldo Bristle. Silverio Lay, P.A.-C Liane Comber, [place] [address] [place], [place] [address] Telephone [phone] Telefax [phone] Annual Screening/Preventative Visit & Comprehensive Evaluation &  Examination  History of Present Illness:        This very nice 63 y.o.  SBFpresents for a Screening /Preventative Visit & comprehensive evaluation and management of multiple medical co-morbidities.  Patient has been followed for HTN, HLD, Morbid Obesity, Prediabetes  and Vitamin D Deficiency. Patient has Gout controlled on her Allopurinol.      HTN predates since 46. Patient's BP has been controlled at home and patient denies any cardiac symptoms as chest pain, palpitations, shortness of breath, dizziness or ankle swelling. Today's BP was initially elevated and rechecked at goal - 136/76      Patient's hyperlipidemia is controlled with diet and medications. Patient denies myalgias or other medication SE's. Last lipids were at goal: Lab Results  Component Value Date   CHOL 170 09/12/2018   HDL 64 09/12/2018   LDLCALC 91 09/12/2018   TRIG 66 09/12/2018   CHOLHDL 2.7 09/12/2018      Patient has  Morbid Obesity (BMI 52+) and hx/o prediabetes predating  (A1c 5.9% / 2011) and patient denies reactive hypoglycemic symptoms, visual blurring, diabetic polys or paresthesias. Last A1c was Normal & at goal: Lab Results  Component Value Date   HGBA1C 5.6 09/12/2018      Finally, patient has history of Vitamin D Deficiency ("18" / 2008)  and last Vitamin D was still extremely low (goal 70-100): Lab Results  Component Value Date   VD25OH 10 (L) 09/12/2018    Current Outpatient Medications on File Prior to Visit  Medication Sig  . allopurinol (ZYLOPRIM) 300 MG tablet TAKE 1 TABLET BY MOUTH EVERY DAY  . aspirin 81 MG tablet Take 81 mg by mouth daily.  . bumetanide (BUMEX) 1 MG  tablet Take 1 tablet (1 mg total) by mouth as needed.  . doxazosin (CARDURA) 8 MG tablet Take 1 tablet at bedtime for BP  . gabapentin (NEURONTIN) 600 MG tablet TAKE 1 TABLET BY MOUTH THREE TIMES A DAY  . naproxen sodium (ANAPROX) 220 MG tablet Take 220 mg by mouth 2 (two) times daily with a meal.   No current facility-administered medications on file prior to visit.    Allergies  Allergen Reactions  . Ace Inhibitors     Cough  . Losartan   . Paxil [Paroxetine Hcl]     Mood swings  . Prednisone     Nausea/ irratated  . Vitamin D Analogs     Cramping   Past Medical History:  Diagnosis Date  . Anemia   . DDD (degenerative disc disease)   . DJD (degenerative joint disease)   . Elevated hemoglobin A1c   . GERD (gastroesophageal reflux disease)   . Hyperlipidemia   . Hypertension   . Obesity   . Sickle cell trait [facility])    Health Maintenance  Topic Date Due  . PAP SMEAR-Modifier  06/09/1975  . MAMMOGRAM  10/08/2018  . INFLUENZA VACCINE  12/23/2018  . COLONOSCOPY  06/25/2019  . TETANUS/TDAP  06/30/2025  . Hepatitis C Screening  Completed  . HIV Screening  Completed   Immunization History  Administered Date(s) Administered  . PPD Test 05/02/2013, 06/18/2014, 07/27/2016, 08/22/2017, 09/12/2018  . Td 07/01/2015   Last Colon - 06/24/2009 - Dr Amedeo Plenty recc 10 yr f/u -  due Feb 2021.   Last MGM - 10/07/2017  Past Surgical History:  Procedure Laterality Date  . SHOULDER SURGERY Left 2007   Family History  Problem Relation Age of Onset  . Heart disease Mother   . Cancer Father        prostate  . Multiple myeloma Father   . Diabetes Sister   . Mental illness Sister   . Diabetes Brother   . Obstructive Sleep Apnea Brother   . Breast cancer Neg Hx    Social History   Tobacco Use  . Smoking status: Never Smoker  . Smokeless tobacco: Never Used  Substance Use Topics  . Alcohol use: Yes    Alcohol/week: 6.0 standard drinks    Types: 6 Standard drinks or equivalent  per week  . Drug use: Not on file    ROS Constitutional: Denies fever, chills, weight loss/gain, headaches, insomnia,  night sweats, and change in appetite. Does c/o fatigue. Eyes: Denies redness, blurred vision, diplopia, discharge, itchy, watery eyes.  ENT: Denies discharge, congestion, post nasal drip, epistaxis, sore throat, earache, hearing loss, dental pain, Tinnitus, Vertigo, Sinus pain, snoring.  Cardio: Denies chest pain, palpitations, irregular heartbeat, syncope, dyspnea, diaphoresis, orthopnea, PND, claudication, edema Respiratory: denies cough, dyspnea, DOE, pleurisy, hoarseness, laryngitis, wheezing.  Gastrointestinal: Denies dysphagia, heartburn, reflux, water brash, pain, cramps, nausea, vomiting, bloating, diarrhea, constipation, hematemesis, melena, hematochezia, jaundice, hemorrhoids Genitourinary: Denies dysuria, frequency, urgency, nocturia, hesitancy, discharge, hematuria, flank pain Breast: Breast lumps, nipple discharge, bleeding.  Musculoskeletal: Denies arthralgia, myalgia, stiffness, Jt. Swelling, pain, limp, and strain/sprain. Denies falls. Skin: Denies puritis, rash, hives, warts, acne, eczema, changing in skin lesion Neuro: No weakness, tremor, incoordination, spasms, paresthesia, pain Psychiatric: Denies confusion, memory loss, sensory loss. Denies Depression. Endocrine: Denies change in weight, skin, hair change, nocturia, and paresthesia, diabetic polys, visual blurring, hyper / hypo glycemic episodes.  Heme/Lymph: No excessive bleeding, bruising, enlarged lymph nodes.  Physical Exam  BP 136/76   Pulse 80   Temp (!) 97.4 F (36.3 C)   Resp 18   Ht '5\' 7"'  (1.702 m)   Wt (!) 333 lb 8 oz (151.3 kg)   BMI 52.23 kg/m   General Appearance: Well nourished, well groomed and in no apparent distress.  Eyes: PERRLA, EOMs, conjunctiva no swelling or erythema, normal fundi and vessels. Sinuses: No frontal/maxillary tenderness ENT/Mouth: EACs patent / TMs  nl.  Nares clear without erythema, swelling, mucoid exudates. Oral hygiene is good. No erythema, swelling, or exudate. Tongue normal, non-obstructing. Tonsils not swollen or erythematous. Hearing normal.  Neck: Supple, thyroid not palpable. No bruits, nodes or JVD. Respiratory: Respiratory effort normal.  BS equal and clear bilateral without rales, rhonci, wheezing or stridor. Cardio: Heart sounds are normal with regular rate and rhythm and no murmurs, rubs or gallops. Peripheral pulses are normal and equal bilaterally without edema. No aortic or femoral bruits. Chest: symmetric with normal excursions and percussion. Breasts: deferred to Dr Ulanda Edison Abdomen: Flat, soft with bowel sounds active. Nontender, no guarding, rebound, hernias, masses, or organomegaly.  Lymphatics: Non tender without lymphadenopathy.  Musculoskeletal: Full ROM all peripheral extremities, joint stability, 5/5 strength, and normal gait. Skin: Warm and dry without rashes, lesions, cyanosis, clubbing or  ecchymosis.  Neuro: Cranial nerves intact, reflexes equal bilaterally. Normal muscle tone, no cerebellar symptoms. Sensation intact.  Pysch: Alert and oriented X 3, normal affect, Insight and Judgment appropriate.   Assessment and Plan  1. Annual Preventative Screening Examination  2. Essential hypertension  - EKG 12-Lead -  [place], RETROPERITNL ABD,  LTD - Urinalysis, Routine w reflex microscopic - Microalbumin / creatinine urine ratio - CBC with Differential/Platelet - COMPLETE METABOLIC PANEL WITH GFR - Magnesium - TSH  3. Hyperlipidemia, mixed  - EKG 12-Lead - [place], RETROPERITNL ABD,  LTD - Lipid panel - TSH  4. Abnormal glucose  - EKG 12-Lead - [place], RETROPERITNL ABD,  LTD - Hemoglobin A1c - Insulin, random  5. Vitamin D deficiency  - VITAMIN D 25 Hydroxyl  6. PreDiabetes  - EKG 12-Lead - [place], RETROPERITNL ABD,  LTD - Hemoglobin A1c - Insulin, random  7. Idiopathic gout  - Uric acid  8. Primary  osteoarthritis involving multiple joints   9. Class 3 severe obesity due to excess calories with serious comorbidity and body mass index (BMI) of 50.0 to 59.9 in adult ([place])   10. Screening for colorectal cancer  - POC Hemoccult Bld/Stl   11. Screening for ischemic heart disease  - EKG 12-Lead  12. FHx: heart disease  - EKG 12-Lead - [place], RETROPERITNL ABD,  LTD  13. Screening-pulmonary TB  - TB Skin Test - Uric acid  14. Screening for AAA (aortic abdominal aneurysm)  - [place], RETROPERITNL ABD,  LTD  15. Fatigue  - Iron,Total/Total Iron Binding Cap - Vitamin B12 - CBC with Differential/Platelet - TSH  16. Medication management  - Urinalysis, Routine w reflex microscopic - Microalbumin / creatinine urine ratio - CBC with Differential/Platelet - COMPLETE METABOLIC PANEL WITH GFR - Magnesium - Lipid panel - TSH - Hemoglobin A1c - Insulin, random - VITAMIN D 25 Hydroxyl        Patient was counseled in prudent diet to achieve/maintain BMI less than 25 for weight control, BP monitoring, regular exercise and medications. Discussed med's effects and SE's. Screening labs and tests as requested with regular follow-up as recommended. I discussed the assessment and treatment plan as above with the patient. The patient was provided an opportunity to ask questions and all were answered. The patient agreed with the plan and demonstrated an understanding of the instructions. Over 40 minutes of exam, counseling, chart review and high complex critical decision making was performed.    Kirtland Bouchard, MD

## 2018-09-11 NOTE — Patient Instructions (Signed)
>>>>>>>>>>>>>>>>>>>>>>>>>>>>>>>>>>>>>>>>>>>>>>>>>>>>>>> Coronavirus (COVID-19) Are you at risk?  Are you at risk for the Coronavirus (COVID-19)?  To be considered HIGH RISK for Coronavirus (COVID-19), you have to meet the following criteria:  . Traveled to [place], [place], [place], [place] or [place]; or in the [place] to [place], [place], [place]  . or [place]; and have fever, cough, and shortness of breath within the last 2 weeks of travel OR . Been in close contact with a person diagnosed with COVID-19 within the last 2 weeks and have  . fever, cough,and shortness of breath .  . IF YOU DO NOT MEET THESE CRITERIA, YOU ARE CONSIDERED LOW RISK FOR COVID-19.  What to do if you are HIGH RISK for COVID-19?  Marland Kitchen If you are having a medical emergency, call 911. . Seek medical care right away. Before you go to a doctor's office, urgent care or emergency department, .  call ahead and tell them about your recent travel, contact with someone diagnosed with COVID-19  .  and your symptoms.  . You should receive instructions from your physician's office regarding next steps of care.  . When you arrive at healthcare provider, tell the healthcare staff immediately you have returned from  . visiting [place], [place], [place], [place] or [place]; or traveled in the [place] to [place], [place],  . [place] or [place] in the last two weeks or you have been in close contact with a person diagnosed with  . COVID-19 in the last 2 weeks.   . Tell the health care staff about your symptoms: fever, cough and shortness of breath. . After you have been seen by a medical provider, you will be either: o Tested for (COVID-19) and discharged home on quarantine except to seek medical care if  o symptoms worsen, and asked to  - Stay home and avoid contact with others until you get your results (4-5 days)  - Avoid travel on public transportation if possible (such as bus, train, or airplane)  or o Sent to the Emergency Department by EMS for evaluation, COVID-19 testing  and  o possible admission depending on your condition and test results.  What to do if you are LOW RISK for COVID-19?  Reduce your risk of any infection by using the same precautions used for avoiding the common cold or flu:  Marland Kitchen Wash your hands often with soap and warm water for at least 20 seconds.  If soap and water are not readily available,  . use an alcohol-based hand sanitizer with at least 60% alcohol.  . If coughing or sneezing, cover your mouth and nose by coughing or sneezing into the elbow areas of your shirt or coat, .  into a tissue or into your sleeve (not your hands). . Avoid shaking hands with others and consider head nods or verbal greetings only. . Avoid touching your eyes, nose, or mouth with unwashed hands.  . Avoid close contact with people who are sick. . Avoid places or events with large numbers of people in one location, like concerts or sporting events. . Carefully consider travel plans you have or are making. . If you are planning any travel outside or inside the [place], visit the CDC's Travelers' Health webpage for the latest health notices. . If you have some symptoms but not all symptoms, continue to monitor at home and seek medical attention  . if your symptoms worsen. . If you are having a medical emergency, call 911.   . >>>>>>>>>>>>>>>>>>>>>>>>>>>>>>>>> .  We Do NOT Approve of  [facility], [employer]  Our Patients  To Do Home Visits & We Do NOT Approve of [employer] > > > > > > > > > > > > > > > > > > > > > > > > > > > > > > > > > > > > > > >  Preventive Care for Adults  A healthy lifestyle and preventive care can promote health and wellness. Preventive health guidelines for women include the following key practices.  A routine yearly physical is a good way to check with your health care provider about your health and preventive screening. It is a  chance to share any concerns and updates on your health and to receive a thorough exam.  Visit your dentist for a routine exam and preventive care every 6 months. Brush your teeth twice a day and floss once a day. Good oral hygiene prevents tooth decay and gum disease.  The frequency of eye exams is based on your age, health, family medical history, use of contact lenses, and other factors. Follow your health care provider's recommendations for frequency of eye exams.  Eat a healthy diet. Foods like vegetables, fruits, whole grains, low-fat dairy products, and lean protein foods contain the nutrients you need without too many calories. Decrease your intake of foods high in solid fats, added sugars, and salt. Eat the right amount of calories for you. Get information about a proper diet from your health care provider, if necessary.  Regular physical exercise is one of the most important things you can do for your health. Most adults should get at least 150 minutes of moderate-intensity exercise (any activity that increases your heart rate and causes you to sweat) each week. In addition, most adults need muscle-strengthening exercises on 2 or more days a week.  Maintain a healthy weight. The body mass index (BMI) is a screening tool to identify possible weight problems. It provides an estimate of body fat based on height and weight. Your health care provider can find your BMI and can help you achieve or maintain a healthy weight. For adults 20 years and older:  A BMI below 18.5 is considered underweight.  A BMI of 18.5 to 24.9 is normal.  A BMI of 25 to 29.9 is considered overweight.  A BMI of 30 and above is considered obese.  Maintain normal blood lipids and cholesterol levels by exercising and minimizing your intake of saturated fat. Eat a balanced diet with plenty of fruit and vegetables. If your lipid or cholesterol levels are high, you are over 50, or you are at high risk for heart disease,  you may need your cholesterol levels checked more frequently. Ongoing high lipid and cholesterol levels should be treated with medicines if diet and exercise are not working.  If you smoke, find out from your health care provider how to quit. If you do not use tobacco, do not start.  Lung cancer screening is recommended for adults aged 60-80 years who are at high risk for developing lung cancer because of a history of smoking. A yearly low-dose CT scan of the lungs is recommended for people who have at least a 30-pack-year history of smoking and are a current smoker or have quit within the past 15 years. A pack year of smoking is smoking an average of 1 pack of cigarettes a day for 1 year (for example: 1 pack a day for 30 years or 2 packs a  day for 15 years). Yearly screening should continue until the smoker has stopped smoking for at least 15 years. Yearly screening should be stopped for people who develop a health problem that would prevent them from having lung cancer treatment.  Avoid use of street drugs. Do not share needles with anyone. Ask for help if you need support or instructions about stopping the use of drugs.  High blood pressure causes heart disease and increases the risk of stroke.  Ongoing high blood pressure should be treated with medicines if weight loss and exercise do not work.  If you are 16-48 years old, ask your health care provider if you should take aspirin to prevent strokes.  Diabetes screening involves taking a blood sample to check your fasting blood sugar level. This should be done once every 3 years, after age 8, if you are within normal weight and without risk factors for diabetes. Testing should be considered at a younger age or be carried out more frequently if you are overweight and have at least 1 risk factor for diabetes.  Breast cancer screening is essential preventive care for women. You should practice "breast self-awareness." This means understanding the  normal appearance and feel of your breasts and may include breast self-examination. Any changes detected, no matter how small, should be reported to a health care provider. Women in their 28s and 30s should have a clinical breast exam (CBE) by a health care provider as part of a regular health exam every 1 to 3 years. After age 45, women should have a CBE every year. Starting at age 38, women should consider having a mammogram (breast X-ray test) every year. Women who have a family history of breast cancer should talk to their health care provider about genetic screening. Women at a high risk of breast cancer should talk to their health care providers about having an MRI and a mammogram every year.  Breast cancer gene (BRCA)-related cancer risk assessment is recommended for women who have family members with BRCA-related cancers. BRCA-related cancers include breast, ovarian, tubal, and peritoneal cancers. Having family members with these cancers may be associated with an increased risk for harmful changes (mutations) in the breast cancer genes BRCA1 and BRCA2. Results of the assessment will determine the need for genetic counseling and BRCA1 and BRCA2 testing.  Routine pelvic exams to screen for cancer are no longer recommended for nonpregnant women who are considered low risk for cancer of the pelvic organs (ovaries, uterus, and vagina) and who do not have symptoms. Ask your health care provider if a screening pelvic exam is right for you.  If you have had past treatment for cervical cancer or a condition that could lead to cancer, you need Pap tests and screening for cancer for at least 20 years after your treatment. If Pap tests have been discontinued, your risk factors (such as having a new sexual partner) need to be reassessed to determine if screening should be resumed. Some women have medical problems that increase the chance of getting cervical cancer. In these cases, your health care provider may  recommend more frequent screening and Pap tests.    Colorectal cancer can be detected and often prevented. Most routine colorectal cancer screening begins at the age of 69 years and continues through age 55 years. However, your health care provider may recommend screening at an earlier age if you have risk factors for colon cancer. On a yearly basis, your health care provider may provide home test kits to  check for hidden blood in the stool. Use of a small camera at the end of a tube, to directly examine the colon (sigmoidoscopy or colonoscopy), can detect the earliest forms of colorectal cancer. Talk to your health care provider about this at age 50, when routine screening begins.  Direct exam of the colon should be repeated every 5-10 years through age 75 years, unless early forms of pre-cancerous polyps or small growths are found.  Osteoporosis is a disease in which the bones lose minerals and strength with aging. This can result in serious bone fractures or breaks. The risk of osteoporosis can be identified using a bone density scan. Women ages 65 years and over and women at risk for fractures or osteoporosis should discuss screening with their health care providers. Ask your health care provider whether you should take a calcium supplement or vitamin D to reduce the rate of osteoporosis.  Menopause can be associated with physical symptoms and risks. Hormone replacement therapy is available to decrease symptoms and risks. You should talk to your health care provider about whether hormone replacement therapy is right for you.  Use sunscreen. Apply sunscreen liberally and repeatedly throughout the day. You should seek shade when your shadow is shorter than you. Protect yourself by wearing long sleeves, pants, a wide-brimmed hat, and sunglasses year round, whenever you are outdoors.  Once a month, do a whole body skin exam, using a mirror to look at the skin on your back. Tell your health care provider  of new moles, moles that have irregular borders, moles that are larger than a pencil eraser, or moles that have changed in shape or color.  Stay current with required vaccines (immunizations).  Influenza vaccine. All adults should be immunized every year.  Tetanus, diphtheria, and acellular pertussis (Td, Tdap) vaccine. Pregnant women should receive 1 dose of Tdap vaccine during each pregnancy. The dose should be obtained regardless of the length of time since the last dose. Immunization is preferred during the 27th-36th week of gestation. An adult who has not previously received Tdap or who does not know her vaccine status should receive 1 dose of Tdap. This initial dose should be followed by tetanus and diphtheria toxoids (Td) booster doses every 10 years. Adults with an unknown or incomplete history of completing a 3-dose immunization series with Td-containing vaccines should begin or complete a primary immunization series including a Tdap dose. Adults should receive a Td booster every 10 years.    Zoster vaccine. One dose is recommended for adults aged 60 years or older unless certain conditions are present.    Pneumococcal 13-valent conjugate (PCV13) vaccine. When indicated, a person who is uncertain of her immunization history and has no record of immunization should receive the PCV13 vaccine. An adult aged 19 years or older who has certain medical conditions and has not been previously immunized should receive 1 dose of PCV13 vaccine. This PCV13 should be followed with a dose of pneumococcal polysaccharide (PPSV23) vaccine. The PPSV23 vaccine dose should be obtained at least 1 or more year(s) after the dose of PCV13 vaccine. An adult aged 19 years or older who has certain medical conditions and previously received 1 or more doses of PPSV23 vaccine should receive 1 dose of PCV13. The PCV13 vaccine dose should be obtained 1 or more years after the last PPSV23 vaccine dose.    Pneumococcal  polysaccharide (PPSV23) vaccine. When PCV13 is also indicated, PCV13 should be obtained first. All adults aged 65 years and older   should be immunized. An adult younger than age 58 years who has certain medical conditions should be immunized. Any person who resides in a nursing home or long-term care facility should be immunized. An adult smoker should be immunized. People with an immunocompromised condition and certain other conditions should receive both PCV13 and PPSV23 vaccines. People with human immunodeficiency virus (HIV) infection should be immunized as soon as possible after diagnosis. Immunization during chemotherapy or radiation therapy should be avoided. Routine use of PPSV23 vaccine is not recommended for American Indians, [place] Natives, or people younger than 65 years unless there are medical conditions that require PPSV23 vaccine. When indicated, people who have unknown immunization and have no record of immunization should receive PPSV23 vaccine. One-time revaccination 5 years after the first dose of PPSV23 is recommended for people aged 19-64 years who have chronic kidney failure, nephrotic syndrome, asplenia, or immunocompromised conditions. People who received 1-2 doses of PPSV23 before age 82 years should receive another dose of PPSV23 vaccine at age 2 years or later if at least 5 years have passed since the previous dose. Doses of PPSV23 are not needed for people immunized with PPSV23 at or after age 9 years.   Preventive Services / Frequency  Ages 40 years and over  Blood pressure check.  Lipid and cholesterol check.  Lung cancer screening. / Every year if you are aged 49-80 years and have a 30-pack-year history of smoking and currently smoke or have quit within the past 15 years. Yearly screening is stopped once you have quit smoking for at least 15 years or develop a health problem that would prevent you from having lung cancer treatment.  Clinical breast exam.** / Every year  after age 53 years.   BRCA-related cancer risk assessment.** / For women who have family members with a BRCA-related cancer (breast, ovarian, tubal, or peritoneal cancers).  Mammogram.** / Every year beginning at age 56 years and continuing for as long as you are in good health. Consult with your health care provider.  Pap test.** / Every 3 years starting at age 73 years through age 8 or 48 years with 3 consecutive normal Pap tests. Testing can be stopped between 65 and 70 years with 3 consecutive normal Pap tests and no abnormal Pap or HPV tests in the past 10 years.  Fecal occult blood test (FOBT) of stool. / Every year beginning at age 58 years and continuing until age 53 years. You may not need to do this test if you get a colonoscopy every 10 years.  Flexible sigmoidoscopy or colonoscopy.** / Every 5 years for a flexible sigmoidoscopy or every 10 years for a colonoscopy beginning at age 35 years and continuing until age 78 years.  Hepatitis C blood test.** / For all people born from [birth year] through 1966 and any individual with known risks for hepatitis C.  Osteoporosis screening.** / A one-time screening for women ages 54 years and over and women at risk for fractures or osteoporosis.  Skin self-exam. / Monthly.  Influenza vaccine. / Every year.  Tetanus, diphtheria, and acellular pertussis (Tdap/Td) vaccine.** / 1 dose of Td every 10 years.  Zoster vaccine.** / 1 dose for adults aged 48 years or older.  Pneumococcal 13-valent conjugate (PCV13) vaccine.** / Consult your health care provider.  Pneumococcal polysaccharide (PPSV23) vaccine.** / 1 dose for all adults aged 14 years and older. Screening for abdominal aortic aneurysm (AAA)  by ultrasound is recommended for people who have history of high blood pressure  or who are current or former smokers. ++++++++++++++++++++ Recommend Adult Low Dose Aspirin or  coated  Aspirin 81 mg daily  To reduce risk of Colon Cancer 20 %,  Skin  Cancer 26 % ,  Melanoma 46%  and  Pancreatic cancer 60% ++++++++++++++++++++ Vitamin D goal  is between 70-100.  Please make sure that you are taking your Vitamin D as directed.  It is very important as a natural anti-inflammatory  helping hair, skin, and nails, as well as reducing stroke and heart attack risk.  It helps your bones and helps with mood. It also decreases numerous cancer risks so please take it as directed.  Low Vit D is associated with a 200-300% higher risk for CANCER  and 200-300% higher risk for HEART   ATTACK  &  STROKE.   .....................................Marland Kitchen It is also associated with higher death rate at younger ages,  autoimmune diseases like Rheumatoid arthritis, Lupus, Multiple Sclerosis.    Also many other serious conditions, like depression, Alzheimer's Dementia, infertility, muscle aches, fatigue, fibromyalgia - just to name a few. ++++++++++++++++++ Recommend the book "The END of DIETING" by Dr Excell Seltzer  & the book "The END of DIABETES " by Dr Excell Seltzer At [email or web address] - get book & Audio CD's    Being diabetic has a  300% increased risk for heart attack, stroke, cancer, and alzheimer- type vascular dementia. It is very important that you work harder with diet by avoiding all foods that are white. Avoid white rice (brown & wild rice is OK), white potatoes (sweetpotatoes in moderation is OK), White bread or wheat bread or anything made out of white flour like bagels, donuts, rolls, buns, biscuits, cakes, pastries, cookies, pizza crust, and pasta (made from white flour & egg whites) - vegetarian pasta or spinach or wheat pasta is OK. Multigrain breads like Arnold's or Pepperidge [employer], or multigrain sandwich thins or flatbreads.  Diet, exercise and weight loss can reverse and cure diabetes in the early stages.  Diet, exercise and weight loss is very important in the control and prevention of complications of diabetes which affects every system in your body, ie.  Brain - dementia/stroke, eyes - glaucoma/blindness, heart - heart attack/heart failure, kidneys - dialysis, stomach - gastric paralysis, intestines - malabsorption, nerves - severe painful neuritis, circulation - gangrene & loss of a leg(s), and finally cancer and Alzheimers.    I recommend avoid fried & greasy foods,  sweets/candy, white rice (brown or wild rice or Quinoa is OK), white potatoes (sweet potatoes are OK) - anything made from white flour - bagels, doughnuts, rolls, buns, biscuits,white and wheat breads, pizza crust and traditional pasta made of white flour & egg white(vegetarian pasta or spinach or wheat pasta is OK).  Multi-grain bread is OK - like multi-grain flat bread or sandwich thins. Avoid alcohol in excess. Exercise is also important.    Eat all the vegetables you want - avoid meat, especially red meat and dairy - especially cheese.  Cheese is the most concentrated form of trans-fats which is the worst thing to clog up our arteries. Veggie cheese is OK which can be found in the fresh produce section at [employer] or [employer] or [employer]  +++++++++++++++++++ DASH Eating Plan  DASH stands for "Dietary Approaches to Stop Hypertension."   The DASH eating plan is a healthy eating plan that has been shown to reduce high blood pressure (hypertension). Additional health benefits may include reducing the risk of type 2 diabetes mellitus, heart  disease, and stroke. The DASH eating plan may also help with weight loss. WHAT DO I NEED TO KNOW ABOUT THE DASH EATING PLAN? For the DASH eating plan, you will follow these general guidelines:  Choose foods with a percent daily value for sodium of less than 5% (as listed on the food label).  Use salt-free seasonings or herbs instead of table salt or sea salt.  Check with your health care provider or pharmacist before using salt substitutes.  Eat lower-sodium products, often labeled as "lower sodium" or "no salt added."  Eat fresh  foods.  Eat more vegetables, fruits, and low-fat dairy products.  Choose whole grains. Look for the word "whole" as the first word in the ingredient list.  Choose fish   Limit sweets, desserts, sugars, and sugary drinks.  Choose heart-healthy fats.  Eat veggie cheese   Eat more home-cooked food and less restaurant, buffet, and fast food.  Limit fried foods.  Cook foods using methods other than frying.  Limit canned vegetables. If you do use them, rinse them well to decrease the sodium.  When eating at a restaurant, ask that your food be prepared with less salt, or no salt if possible.                      WHAT FOODS CAN I EAT? Read Dr Fara Olden Fuhrman's books on The End of Dieting & The End of Diabetes  Grains Whole grain or whole wheat bread. Brown rice. Whole grain or whole wheat pasta. Quinoa, bulgur, and whole grain cereals. Low-sodium cereals. Corn or whole wheat flour tortillas. Whole grain cornbread. Whole grain crackers. Low-sodium crackers.  Vegetables Fresh or frozen vegetables (raw, steamed, roasted, or grilled). Low-sodium or reduced-sodium tomato and vegetable juices. Low-sodium or reduced-sodium tomato sauce and paste. Low-sodium or reduced-sodium canned vegetables.   Fruits All fresh, canned (in natural juice), or frozen fruits.  Protein Products  All fish and seafood.  Dried beans, peas, or lentils. Unsalted nuts and seeds. Unsalted canned beans.  Dairy Low-fat dairy products, such as skim or 1% milk, 2% or reduced-fat cheeses, low-fat ricotta or cottage cheese, or plain low-fat yogurt. Low-sodium or reduced-sodium cheeses.  Fats and Oils Tub margarines without trans fats. Light or reduced-fat mayonnaise and salad dressings (reduced sodium). Avocado. Safflower, olive, or canola oils. Natural peanut or almond butter.  Other Unsalted popcorn and pretzels. The items listed above may not be a complete list of recommended foods or beverages. Contact your  dietitian for more options.  +++++++++++++++  WHAT FOODS ARE NOT RECOMMENDED? Grains/ White flour or wheat flour White bread. White pasta. White rice. Refined cornbread. Bagels and croissants. Crackers that contain trans fat.  Vegetables  Creamed or fried vegetables. Vegetables in a . Regular canned vegetables. Regular canned tomato sauce and paste. Regular tomato and vegetable juices.  Fruits Dried fruits. Canned fruit in light or heavy syrup. Fruit juice.  Meat and Other Protein Products Meat in general - RED meat & White meat.  Fatty cuts of meat. Ribs, chicken wings, all processed meats as bacon, sausage, bologna, salami, fatback, hot dogs, bratwurst and packaged luncheon meats.  Dairy Whole or 2% milk, cream, half-and-half, and cream cheese. Whole-fat or sweetened yogurt. Full-fat cheeses or blue cheese. Non-dairy creamers and whipped toppings. Processed cheese, cheese spreads, or cheese curds.  Condiments Onion and garlic salt, seasoned salt, table salt, and sea salt. Canned and packaged gravies. Worcestershire sauce. Tartar sauce. Barbecue sauce. Teriyaki sauce. Soy sauce, including  reduced sodium. Steak sauce. Fish sauce. Oyster sauce. Cocktail sauce. Horseradish. Ketchup and mustard. Meat flavorings and tenderizers. Bouillon cubes. Hot sauce. Tabasco sauce. Marinades. Taco seasonings. Relishes.  Fats and Oils Butter, stick margarine, lard, shortening and bacon fat. Coconut, palm kernel, or palm oils. Regular salad dressings.  Pickles and olives. Salted popcorn and pretzels.  The items listed above may not be a complete list of foods and beverages to avoid.

## 2018-09-12 ENCOUNTER — Ambulatory Visit (INDEPENDENT_AMBULATORY_CARE_PROVIDER_SITE_OTHER): Payer: 59 | Admitting: Internal Medicine

## 2018-09-12 ENCOUNTER — Other Ambulatory Visit: Payer: Self-pay

## 2018-09-12 VITALS — BP 136/76 | HR 80 | Temp 97.4°F | Resp 18 | Ht 67.0 in | Wt 333.5 lb

## 2018-09-12 DIAGNOSIS — Z0001 Encounter for general adult medical examination with abnormal findings: Secondary | ICD-10-CM

## 2018-09-12 DIAGNOSIS — Z0000 Encounter for general adult medical examination without abnormal findings: Secondary | ICD-10-CM

## 2018-09-12 DIAGNOSIS — Z6841 Body Mass Index (BMI) 40.0 and over, adult: Secondary | ICD-10-CM

## 2018-09-12 DIAGNOSIS — Z111 Encounter for screening for respiratory tuberculosis: Secondary | ICD-10-CM | POA: Diagnosis not present

## 2018-09-12 DIAGNOSIS — E782 Mixed hyperlipidemia: Secondary | ICD-10-CM

## 2018-09-12 DIAGNOSIS — E559 Vitamin D deficiency, unspecified: Secondary | ICD-10-CM

## 2018-09-12 DIAGNOSIS — M1000 Idiopathic gout, unspecified site: Secondary | ICD-10-CM

## 2018-09-12 DIAGNOSIS — Z1212 Encounter for screening for malignant neoplasm of rectum: Secondary | ICD-10-CM

## 2018-09-12 DIAGNOSIS — Z8249 Family history of ischemic heart disease and other diseases of the circulatory system: Secondary | ICD-10-CM

## 2018-09-12 DIAGNOSIS — Z136 Encounter for screening for cardiovascular disorders: Secondary | ICD-10-CM

## 2018-09-12 DIAGNOSIS — R7309 Other abnormal glucose: Secondary | ICD-10-CM

## 2018-09-12 DIAGNOSIS — I10 Essential (primary) hypertension: Secondary | ICD-10-CM

## 2018-09-12 DIAGNOSIS — Z1211 Encounter for screening for malignant neoplasm of colon: Secondary | ICD-10-CM

## 2018-09-12 DIAGNOSIS — M8949 Other hypertrophic osteoarthropathy, multiple sites: Secondary | ICD-10-CM

## 2018-09-12 DIAGNOSIS — Z79899 Other long term (current) drug therapy: Secondary | ICD-10-CM | POA: Diagnosis not present

## 2018-09-12 DIAGNOSIS — R5383 Other fatigue: Secondary | ICD-10-CM

## 2018-09-12 DIAGNOSIS — M159 Polyosteoarthritis, unspecified: Secondary | ICD-10-CM

## 2018-09-12 DIAGNOSIS — M150 Primary generalized (osteo)arthritis: Secondary | ICD-10-CM

## 2018-09-13 ENCOUNTER — Other Ambulatory Visit: Payer: Self-pay | Admitting: Internal Medicine

## 2018-09-13 DIAGNOSIS — N3000 Acute cystitis without hematuria: Secondary | ICD-10-CM

## 2018-09-13 LAB — HEMOGLOBIN A1C
Hgb A1c MFr Bld: 5.6 % of total Hgb (ref <5.7)
Mean Plasma Glucose: 114 (calc)
eAG (mmol/L): 6.3 (calc)

## 2018-09-13 LAB — COMPLETE METABOLIC PANEL WITH GFR
AG Ratio: 1.2 (calc) (ref 1.0–2.5)
ALT: 16 U/L (ref 6–29)
AST: 19 U/L (ref 10–35)
Albumin: 4.1 g/dL (ref 3.6–5.1)
Alkaline phosphatase (APISO): 130 U/L (ref 37–153)
BUN: 14 mg/dL (ref 7–25)
CO2: 25 mmol/L (ref 20–32)
Calcium: 9.1 mg/dL (ref 8.6–10.4)
Chloride: 102 mmol/L (ref 98–110)
Creat: 0.82 mg/dL (ref 0.50–0.99)
GFR, Est African American: 88 mL/min/{1.73_m2} (ref >=60)
GFR, Est Non African American: 76 mL/min/{1.73_m2} (ref >=60)
Globulin: 3.4 g/dL (calc) (ref 1.9–3.7)
Glucose, Bld: 129 mg/dL — ABNORMAL HIGH (ref 65–99)
Potassium: 4.8 mmol/L (ref 3.5–5.3)
Sodium: 138 mmol/L (ref 135–146)
Total Bilirubin: 0.9 mg/dL (ref 0.2–1.2)
Total Protein: 7.5 g/dL (ref 6.1–8.1)

## 2018-09-13 LAB — MICROALBUMIN / CREATININE URINE RATIO
Creatinine, Urine: 122 mg/dL (ref 20–275)
Microalb Creat Ratio: 22 mcg/mg creat (ref <30)
Microalb, Ur: 2.7 mg/dL

## 2018-09-13 LAB — URINALYSIS, ROUTINE W REFLEX MICROSCOPIC
Bilirubin Urine: NEGATIVE
Glucose, UA: NEGATIVE
Ketones, ur: NEGATIVE
Nitrite: NEGATIVE
Protein, ur: NEGATIVE
Specific Gravity, Urine: 1.016 (ref 1.001–1.03)
pH: <=5 (ref 5.0–8.0)

## 2018-09-13 LAB — TSH: TSH: 3.69 mIU/L (ref 0.40–4.50)

## 2018-09-13 LAB — CBC WITH DIFFERENTIAL/PLATELET
Absolute Monocytes: 461 cells/uL (ref 200–950)
Basophils Absolute: 38 cells/uL (ref 0–200)
Basophils Relative: 0.6 %
Eosinophils Absolute: 128 cells/uL (ref 15–500)
Eosinophils Relative: 2 %
HCT: 38 % (ref 35.0–45.0)
Hemoglobin: 12.6 g/dL (ref 11.7–15.5)
Lymphs Abs: 1792 cells/uL (ref 850–3900)
MCH: 28.1 pg (ref 27.0–33.0)
MCHC: 33.2 g/dL (ref 32.0–36.0)
MCV: 84.6 fL (ref 80.0–100.0)
MPV: 10.7 fL (ref 7.5–12.5)
Monocytes Relative: 7.2 %
Neutro Abs: 3981 cells/uL (ref 1500–7800)
Neutrophils Relative %: 62.2 %
Platelets: 225 10*3/uL (ref 140–400)
RBC: 4.49 10*6/uL (ref 3.80–5.10)
RDW: 13.2 % (ref 11.0–15.0)
Total Lymphocyte: 28 %
WBC: 6.4 10*3/uL (ref 3.8–10.8)

## 2018-09-13 LAB — LIPID PANEL
Cholesterol: 170 mg/dL (ref <200)
HDL: 64 mg/dL (ref >=50)
LDL Cholesterol (Calc): 91 mg/dL (calc)
Non-HDL Cholesterol (Calc): 106 mg/dL (calc) (ref <130)
Total CHOL/HDL Ratio: 2.7 (calc) (ref <5.0)
Triglycerides: 66 mg/dL (ref <150)

## 2018-09-13 LAB — MAGNESIUM: Magnesium: 2 mg/dL (ref 1.5–2.5)

## 2018-09-13 LAB — VITAMIN B12: Vitamin B-12: 478 pg/mL (ref 200–1100)

## 2018-09-13 LAB — URIC ACID: Uric Acid, Serum: 5.2 mg/dL (ref 2.5–7.0)

## 2018-09-13 LAB — IRON, TOTAL/TOTAL IRON BINDING CAP
%SAT: 17 % (calc) (ref 16–45)
Iron: 58 ug/dL (ref 45–160)

## 2018-09-13 LAB — VITAMIN D 25 HYDROXY (VIT D DEFICIENCY, FRACTURES): Vit D, 25-Hydroxy: 10 ng/mL — ABNORMAL LOW (ref 30–100)

## 2018-09-13 LAB — IRON,?TOTAL/TOTAL IRON BINDING CAP: TIBC: 333 mcg/dL (calc) (ref 250–450)

## 2018-09-13 LAB — INSULIN, RANDOM: Insulin: 16.9 u[IU]/mL

## 2018-09-13 MED ORDER — CIPROFLOXACIN HCL 500 MG PO TABS: ORAL_TABLET | ORAL | 0 refills | Status: DC

## 2018-09-14 LAB — TB SKIN TEST
Induration: 0 mm
TB Skin Test: NEGATIVE

## 2018-09-15 ENCOUNTER — Encounter: Payer: Self-pay | Admitting: Internal Medicine

## 2018-10-11 ENCOUNTER — Ambulatory Visit: Payer: 59

## 2018-10-17 ENCOUNTER — Ambulatory Visit: Payer: 59

## 2018-10-19 ENCOUNTER — Ambulatory Visit: Payer: 59

## 2018-10-19 ENCOUNTER — Other Ambulatory Visit: Payer: Self-pay

## 2018-10-19 DIAGNOSIS — N3000 Acute cystitis without hematuria: Secondary | ICD-10-CM

## 2018-10-20 LAB — URINE CULTURE
MICRO NUMBER:: 514754
Result:: NO GROWTH
SPECIMEN QUALITY:: ADEQUATE

## 2018-10-20 LAB — URINALYSIS, ROUTINE W REFLEX MICROSCOPIC
Bacteria, UA: NONE SEEN /HPF
Bilirubin Urine: NEGATIVE
Glucose, UA: NEGATIVE
Hyaline Cast: NONE SEEN /LPF
Ketones, ur: NEGATIVE
Nitrite: NEGATIVE
Protein, ur: NEGATIVE
Specific Gravity, Urine: 1.014 (ref 1.001–1.03)
pH: <=5 (ref 5.0–8.0)

## 2018-10-23 NOTE — Progress Notes (Signed)
Patient presents to the office for a nurse visit to leave a urine sample for acute cystitis without hematuria. Patient completed antibiotics and not experiencing any signs or symptoms at this time.  BP was elevated when taken and provider informed and instructed patient to continue to monitor closely at home.

## 2018-12-11 ENCOUNTER — Other Ambulatory Visit: Payer: Self-pay | Admitting: Obstetrics and Gynecology

## 2018-12-11 DIAGNOSIS — Z1231 Encounter for screening mammogram for malignant neoplasm of breast: Secondary | ICD-10-CM

## 2018-12-18 NOTE — Progress Notes (Signed)
FOLLOW UP  Assessment and Plan:   Hypertension Somewhat elevated -discussed goal - NEEDS TO START CHECKING- work on lifestyle  Monitor blood pressure at home; patient to call if consistently greater than 130/80 Continue DASH diet.   Reminder to go to the ER if any CP, SOB, nausea, dizziness, severe HA, changes vision/speech, left arm numbness and tingling and jaw pain.  Cholesterol Currently managed by lifestyle with LDLs at goal of LDL <100 Continue low cholesterol diet and exercise.  Check lipid panel.   Other abnormal glucose Discussed risks associated with morbid obesity and elevated glucose Continue diet and exercise.  Perform daily foot/skin check, notify office of any concerning changes.  Defer A1C to CPE; monitor weights, check CMP/GFR  Morbid obesity with co morbidities Long discussion about weight loss, diet, and exercise Stress and time constraints with job are significant limitations; strategies discussed Recommended diet heavy in fruits and veggies and low in animal meats, cheeses, and dairy products, appropriate calorie intake Discussed ideal weight for height  Patient knows she needs to prioritize health and weight- suggested sleep management, stress reduction, increase fluids, find easy healthy snacks and meals that she can grab and go Weight goal set for 325 lb; she has successfully lost significant amounts of weight in the past, knows what she needs to do Will start the patient on phentermine- reviewed at length and AE's discussed, will do close follow up.  Will follow up in 3 months  Vitamin D Def She has not been supplementing despite recommendation; risks discussed - she agrees to initiate 2000 units daily.  Defer Vit D level  Continue diet and meds as discussed. Further disposition pending results of labs. Discussed med's effects and SE's.   Over 30 minutes of exam, counseling, chart review, and critical decision making was performed.   Future  Appointments  Date Time Provider [place]  01/25/2019 12:00 PM [facility]  03/22/2019 10:30 AM Unk Pinto, MD [facility] None  09/18/2019  9:00 AM Unk Pinto, MD [facility] None    ----------------------------------------------------------------------------------------------------------------------  HPI 63 y.o. female  presents for 3 month follow up on hypertension, cholesterol, history of prediabetes, morbid obesity (BMI 50+) and vitamin D deficiency.   She reports she has been going through increased stress related to jobs changes and now having to drive to [facility] for her job.   BMI is Body mass index is 54.97 kg/m., she has not been working on diet and exercise. Admits she will commonly not eat all day then eat one very large meal at the end of the day. She reports time is significant limitor; she has successfully lost weight in the past with dietary changes (reports got as low as 162 lb at one point). She is in agreement she needs to make changes. Not currently checking weights, needs new scale.  Wt Readings from Last 3 Encounters:  12/20/18 (!) 351 lb (159.2 kg)  10/19/18 (!) 348 lb (157.9 kg)  09/12/18 (!) 333 lb 8 oz (151.3 kg)   She reports she has not checked her BP recently but bought new BP cuff, today their BP is BP: 138/84 by provider manual recheck  She does not workout. She denies chest pain, shortness of breath, dizziness.   She is not on cholesterol medication and denies myalgias. Her cholesterol is at goal. The cholesterol last visit was:   Lab Results  Component Value Date   CHOL 170 09/12/2018   HDL 64 09/12/2018   LDLCALC 91  09/12/2018   TRIG 66 09/12/2018   CHOLHDL 2.7 09/12/2018    She has not been working on diet and exercise for history of prediabetes, and denies increased appetite, nausea, paresthesia of the feet, polydipsia, polyuria, visual disturbances, vomiting and weight loss. Last A1C in the office was:   Lab Results  Component Value Date   HGBA1C 5.6 09/12/2018   Patient is not regular on Vitamin D supplement and remains very low at last check:   Lab Results  Component Value Date   VD25OH 10 (L) 09/12/2018     Patient is on allopurinol for gout and does not report a recent flare.  Lab Results  Component Value Date   LABURIC 5.2 09/12/2018      Current Medications:  Current Outpatient Medications on File Prior to Visit  Medication Sig  . allopurinol (ZYLOPRIM) 300 MG tablet TAKE 1 TABLET BY MOUTH EVERY DAY  . aspirin 81 MG tablet Take 81 mg by mouth daily.  . bumetanide (BUMEX) 1 MG tablet Take 1 tablet (1 mg total) by mouth as needed.  . doxazosin (CARDURA) 8 MG tablet Take 1 tablet at bedtime for BP  . gabapentin (NEURONTIN) 600 MG tablet TAKE 1 TABLET BY MOUTH THREE TIMES A DAY  . naproxen sodium (ANAPROX) 220 MG tablet Take 220 mg by mouth 2 (two) times daily with a meal.  . ciprofloxacin (CIPRO) 500 MG tablet Take 1 tablet 2 x /day with food for UTI   No current facility-administered medications on file prior to visit.      Allergies:  Allergies  Allergen Reactions  . Ace Inhibitors     Cough  . Losartan   . Paxil [Paroxetine Hcl]     Mood swings  . Prednisone     Nausea/ irratated  . Vitamin D Analogs     Cramping     Medical History:  Past Medical History:  Diagnosis Date  . Anemia   . DDD (degenerative disc disease)   . DJD (degenerative joint disease)   . Elevated hemoglobin A1c   . GERD (gastroesophageal reflux disease)   . Hyperlipidemia   . Hypertension   . Obesity   . Sickle cell trait ([place])    Family history- Reviewed and unchanged Social history- Reviewed and unchanged   Review of Systems:  Review of Systems  Constitutional: Negative for malaise/fatigue and weight loss.  HENT: Negative for hearing loss and tinnitus.   Eyes: Negative for blurred vision and double vision.  Respiratory: Negative for cough, shortness of breath and  wheezing.   Cardiovascular: Negative for chest pain, palpitations, orthopnea, claudication and leg swelling.  Gastrointestinal: Negative for abdominal pain, blood in stool, constipation, diarrhea, heartburn, melena, nausea and vomiting.  Genitourinary: Negative.   Musculoskeletal: Negative for joint pain and myalgias.  Skin: Negative for rash.  Neurological: Negative for dizziness, tingling, sensory change, weakness and headaches.  Endo/Heme/Allergies: Negative for polydipsia.  Psychiatric/Behavioral: Negative.  Negative for depression. The patient is not nervous/anxious and does not have insomnia.   All other systems reviewed and are negative.   Physical Exam: BP 138/84   Pulse 81   Temp (!) 97.2 F (36.2 C)   Ht 5\' 7"  (1.702 m)   Wt (!) 351 lb (159.2 kg)   SpO2 99%   BMI 54.97 kg/m  Wt Readings from Last 3 Encounters:  12/20/18 (!) 351 lb (159.2 kg)  10/19/18 (!) 348 lb (157.9 kg)  09/12/18 (!) 333 lb 8 oz (151.3 kg)  General Appearance: Well nourished, morbidly obese, in no apparent distress. Eyes: PERRLA, EOMs, conjunctiva no swelling or erythema Sinuses: No Frontal/maxillary tenderness ENT/Mouth: Ext aud canals clear, TMs without erythema, bulging. No erythema, swelling, or exudate on post pharynx.  Tonsils not swollen or erythematous. Hearing normal.  Neck: Supple, thyroid normal.  Respiratory: Respiratory effort normal, BS equal bilaterally without rales, rhonchi, wheezing or stridor.  Cardio: Distant heart sounds due to body habitus; RRR with no audible MRGs. Brisk peripheral pulses with some non-pitting edema to bilateral ankles.  Abdomen: Soft, obese abdomen, + BS.  Non tender, no guarding, rebound, hernias, masses. Lymphatics: Non tender without lymphadenopathy.  Musculoskeletal: Full ROM, 5/5 strength, Normal gait Skin: Warm, dry without rashes, lesions, ecchymosis.  Neuro: Cranial nerves intact. No cerebellar symptoms.  Psych: Awake and oriented X 3, normal  affect, Insight and Judgment appropriate.    Izora Ribas, NP 11:20 AM [facility]

## 2018-12-20 ENCOUNTER — Other Ambulatory Visit: Payer: Self-pay

## 2018-12-20 ENCOUNTER — Encounter: Payer: Self-pay | Admitting: Adult Health

## 2018-12-20 ENCOUNTER — Ambulatory Visit (INDEPENDENT_AMBULATORY_CARE_PROVIDER_SITE_OTHER): Payer: 59 | Admitting: Adult Health

## 2018-12-20 VITALS — BP 138/84 | HR 81 | Temp 97.2°F | Ht 67.0 in | Wt 351.0 lb

## 2018-12-20 DIAGNOSIS — R7309 Other abnormal glucose: Secondary | ICD-10-CM

## 2018-12-20 DIAGNOSIS — M1000 Idiopathic gout, unspecified site: Secondary | ICD-10-CM

## 2018-12-20 DIAGNOSIS — I10 Essential (primary) hypertension: Secondary | ICD-10-CM | POA: Diagnosis not present

## 2018-12-20 DIAGNOSIS — Z6841 Body Mass Index (BMI) 40.0 and over, adult: Secondary | ICD-10-CM

## 2018-12-20 DIAGNOSIS — E559 Vitamin D deficiency, unspecified: Secondary | ICD-10-CM | POA: Diagnosis not present

## 2018-12-20 DIAGNOSIS — E782 Mixed hyperlipidemia: Secondary | ICD-10-CM

## 2018-12-20 MED ORDER — PHENTERMINE HCL 37.5 MG PO TABS: ORAL_TABLET | ORAL | 2 refills | Status: DC

## 2018-12-20 NOTE — Patient Instructions (Addendum)
Goals    . Blood Pressure < 130/80     Start checking daily    . DIET - INCREASE WATER INTAKE     65-80+ fluid ounces of unsweetened beverages daily    . Weight (lb) < 325 lb (147.4 kg)         Drink 1/2 your body weight in fluid ounces of water daily; drink a tall glass of water 30 min before meals  Don't eat until you're stuffed- listen to your stomach and eat until you are 80% full   Try eating off of a salad plate; wait 10 min after finishing before going back for seconds  Start by eating the vegetables on your plate; aim for 50% of your meals to be fruits or vegetables  Then eat your protein - lean meats (grass fed if possible), fish, beans, nuts in moderation  Eat your carbs/starch last ONLY if you still are hungry. If you can, stop before finishing it all  Avoid sugar and flour - the closer it looks to it's original form in nature, typically the better it is for you  Splurge in moderation - "assign" days when you get to splurge and have the "bad stuff" - I like to follow a 80% - 20% plan- "good" choices 80 % of the time, "bad" choices in moderation 20% of the time  Simple equation is: Calories out > calories in = weight loss - even if you eat the bad stuff, if you limit portions, you will still lose weight    8 Critical Weight-Loss Tips That Aren't Diet and Exercise  1. STARVE THE DISTRACTIONS  All too often when we eat, we're also multitasking: watching TV, answering emails, scrolling through social media. These habits are detrimental to having a strong, clear, healthy relationship with food, and they can hinder our ability to make dietary changes.  In order to truly focus on what you're eating, how much you're eating, why you're eating those specific foods and, most importantly, how those foods make you feel, you need to starve the distractions. That means when you eat, just eat. Focus on your food, the process it went through to end up on your plate, where it came  from and how it nourishes you. With this technique, you're more likely to finish a meal feeling satiated.  2.  CONSIDER WHAT YOU'RE NOT WILLING TO DO  This might sound counterintuitive, but it can help provide a "why" when motivation is waning. Declare, in writing, what you are unwilling to do, for example "I am unwilling to be the old dad who cannot play sports with my children".  So consider what you're not willing to accept, write it down, and keep it at the ready.  3.  STOP LABELING FOOD "GOOD" AND "BAD"  You've probably heard someone say they ate something "bad." Maybe you've even said it yourself.  The trouble with 'bad' foods isn't that they'll send you to the grave after a bite or two. The trouble comes when we eat excessive portions of really calorie-dense foods meal after meal, day after day.  Instead of labeling foods as good or bad, think about which foods you can eat a lot of, and which ones you should just eat a little of. Then, plan ways to eat the foods you really like in portions that fit with your overall goals. A good example of this would be having a slice of pizza alongside a club salad with chicken breast, avocado and a  bit of dressing. This is vastly different than 3 slices of pizza, 4 breadsticks with cheese sauce and half of a liter of regular soda.  4.  BRUSH YOUR TEETH AFTER YOU EAT  Getting your mindset in order is important, but sometimes small habits can make a big difference. After eating, you still have the taste of food in their mouth, which often causes people to eat more even if they are full or engage in a nibble or two of dessert.  Brushing your teeth will remove the taste of food from your mouth, and the clean, minty freshness will serve as a cue that mealtime is over.  5.  FOCUS ON CROWDING NOT CUTTING  The most common first step during 'dieting' is to cut. We cut our portion sizes down, we cut out 'bad' foods, we cut out entire food groups. This act of  cutting puts [place] and our minds into scarcity mode.  When something is off-limits, even if you're able to avoid it for a while, you could end up bingeing on it later because you've gone so long without it. So, instead of cutting, focus on crowding. If you crowd your plate and fill it up with more foods like veggies and protein, it simply allows less room for the other stuff. In other words, shift your focus away from what you can't eat, and celebrate the foods that will help you reach your goals.  6.  TAKE TRACKING A STEP FURTHER  Track what you eat, when you ate it, how much you ate and how that food made you feel. Being completely honest with yourself and writing down every single thing that passes through your lips will help you start to notice that maybe you actually do snack, possibly take in more sugar than you thought, eat when you're bored rather than just hungry or maybe that you have a habit of snacking before bed while watching TV.  The difference from simply tracking your food intake is you're taking into account how food makes you feel, as well as what you're doing while you're eating. This is about becoming more mindful of what, when and why you eat.  7.  PRIORITIZE GOOD SLEEP  One of the strongest risk factors for being overweight is poor sleep. When you're feeling tired, you're more likely to choose unhealthy comfort foods and to skip your workout. Additionally, sleep deprivation may slow down your metabolism. [employer]! Therefore, sleeping 7-8 hours per night can help with weight loss without having to change your diet or increase your physical activity. And if you feel you snore and still wake up tired, talk with me about sleep apnea.  8.  SET ASIDE TIME TO DISCONNECT  Just get out there. Disconnect from the electronics and connect to the elements. Not only will this help reduce stress (a major factor in weight gain) by giving your mind a break from the constant stimulation we've all  become so accustomed to, but it may also reprogram your brain to connect with yourself and what you're feeling.

## 2018-12-21 LAB — COMPLETE METABOLIC PANEL WITH GFR
AG Ratio: 1.2 (calc) (ref 1.0–2.5)
ALT: 16 U/L (ref 6–29)
AST: 17 U/L (ref 10–35)
Albumin: 4.1 g/dL (ref 3.6–5.1)
Alkaline phosphatase (APISO): 118 U/L (ref 37–153)
BUN: 15 mg/dL (ref 7–25)
CO2: 27 mmol/L (ref 20–32)
Calcium: 9 mg/dL (ref 8.6–10.4)
Chloride: 103 mmol/L (ref 98–110)
Creat: 0.81 mg/dL (ref 0.50–0.99)
GFR, Est African American: 89 mL/min/{1.73_m2} (ref >=60)
GFR, Est Non African American: 77 mL/min/{1.73_m2} (ref >=60)
Globulin: 3.5 g/dL (calc) (ref 1.9–3.7)
Glucose, Bld: 119 mg/dL — ABNORMAL HIGH (ref 65–99)
Potassium: 4.5 mmol/L (ref 3.5–5.3)
Sodium: 138 mmol/L (ref 135–146)
Total Bilirubin: 0.9 mg/dL (ref 0.2–1.2)
Total Protein: 7.6 g/dL (ref 6.1–8.1)

## 2018-12-21 LAB — CBC WITH DIFFERENTIAL/PLATELET
Absolute Monocytes: 428 cells/uL (ref 200–950)
Basophils Absolute: 31 cells/uL (ref 0–200)
Basophils Relative: 0.5 %
Eosinophils Absolute: 118 cells/uL (ref 15–500)
Eosinophils Relative: 1.9 %
HCT: 38.6 % (ref 35.0–45.0)
Hemoglobin: 12.6 g/dL (ref 11.7–15.5)
Lymphs Abs: 1730 cells/uL (ref 850–3900)
MCH: 27.9 pg (ref 27.0–33.0)
MCHC: 32.6 g/dL (ref 32.0–36.0)
MCV: 85.6 fL (ref 80.0–100.0)
MPV: 10.4 fL (ref 7.5–12.5)
Monocytes Relative: 6.9 %
Neutro Abs: 3894 cells/uL (ref 1500–7800)
Neutrophils Relative %: 62.8 %
Platelets: 208 10*3/uL (ref 140–400)
RBC: 4.51 10*6/uL (ref 3.80–5.10)
RDW: 13.4 % (ref 11.0–15.0)
Total Lymphocyte: 27.9 %
WBC: 6.2 10*3/uL (ref 3.8–10.8)

## 2018-12-21 LAB — TSH: TSH: 2.65 mIU/L (ref 0.40–4.50)

## 2018-12-21 LAB — MAGNESIUM: Magnesium: 2.1 mg/dL (ref 1.5–2.5)

## 2018-12-21 LAB — LIPID PANEL
Cholesterol: 179 mg/dL (ref <200)
HDL: 63 mg/dL (ref >=50)
LDL Cholesterol (Calc): 101 mg/dL (calc) — ABNORMAL HIGH
Non-HDL Cholesterol (Calc): 116 mg/dL (calc) (ref <130)
Total CHOL/HDL Ratio: 2.8 (calc) (ref <5.0)
Triglycerides: 61 mg/dL (ref <150)

## 2019-01-10 LAB — HM PAP SMEAR: HM Pap smear: NORMAL

## 2019-01-25 ENCOUNTER — Other Ambulatory Visit: Payer: Self-pay

## 2019-01-25 ENCOUNTER — Ambulatory Visit
Admission: RE | Admit: 2019-01-25 | Discharge: 2019-01-25 | Disposition: A | Discharge status: Home / Self Care | Payer: 59 | Source: Ambulatory Visit | Attending: Obstetrics and Gynecology | Admitting: Obstetrics and Gynecology

## 2019-01-25 DIAGNOSIS — Z1231 Encounter for screening mammogram for malignant neoplasm of breast: Secondary | ICD-10-CM

## 2019-03-20 ENCOUNTER — Other Ambulatory Visit: Payer: Self-pay | Admitting: Internal Medicine

## 2019-03-21 ENCOUNTER — Encounter: Payer: Self-pay | Admitting: Internal Medicine

## 2019-03-21 NOTE — Patient Instructions (Signed)

## 2019-03-21 NOTE — Progress Notes (Signed)
      N  O     S  H  O  W                                                                                                                                                         This very nice 63 y.o. single BF presents for 6 month follow up with HTN, HLD, Morbid Obesity  Pre-Diabetes and Vitamin D Deficiency. Patient has hx/o Gout quiescent on her meds.      Patient is treated for HTN (1982) & BP has been controlled at home. Today's  . Patient has had no complaints of any cardiac type chest pain, palpitations, dyspnea / orthopnea / PND, dizziness, claudication, or dependent edema.      Hyperlipidemia is controlled with diet & meds. Patient denies myalgias or other med SE's. Last Lipids were almost to goal:  Lab Results  Component Value Date   CHOL 179 12/20/2018   HDL 63 12/20/2018   LDLCALC 101 (H) 12/20/2018   TRIG 61 12/20/2018   CHOLHDL 2.8 12/20/2018        Also, the patient has Morbid Obesity (BMI 52+) and history of PreDiabetes (A1c 5.9% / 2011)  and has had no symptoms of reactive hypoglycemia, diabetic polys, paresthesias or visual blurring.  Last A1c was Normal & at goal:  Lab Results  Component Value Date   HGBA1C 5.6 09/12/2018       Further, the patient also has history of Vitamin D Deficiency ("18" / 2008) and supplements vitamin D without any suspected side-effects. Last vitamin D was still very low :  Lab Results  Component Value Date   VD25OH 10 (L) 09/12/2018

## 2019-03-22 ENCOUNTER — Ambulatory Visit: Payer: 59 | Admitting: Internal Medicine

## 2019-04-01 ENCOUNTER — Encounter: Payer: Self-pay | Admitting: Internal Medicine

## 2019-04-01 NOTE — Patient Instructions (Signed)
Vit D   & Vit C 1,000 mg    are recommended to help protect  against the Covid-19 and other Corona viruses.    Also it's recommended  to take   Zinc 50 mg   to help  protect against the Covid-19   and best place to get  is also on [email or web address]  and don't pay more than 3 - 5 cents /pill !   ===================================== Coronavirus (COVID-19) Are you at risk?  Are you at risk for the Coronavirus (COVID-19)?  To be considered HIGH RISK for Coronavirus (COVID-19), you have to meet the following criteria:  . Traveled to [place], [place], [place], [place] or [place]; or in the [place] to [place], [place], [place]  . or [place]; and have fever, cough, and shortness of breath within the last 2 weeks of travel OR . Been in close contact with a person diagnosed with COVID-19 within the last 2 weeks and have  . fever, cough,and shortness of breath .  . IF YOU DO NOT MEET THESE CRITERIA, YOU ARE CONSIDERED LOW RISK FOR COVID-19.  What to do if you are HIGH RISK for COVID-19?  Marland Kitchen If you are having a medical emergency, call 911. . Seek medical care right away. Before you go to a doctor's office, urgent care or emergency department, .  call ahead and tell them about your recent travel, contact with someone diagnosed with COVID-19  .  and your symptoms.  . You should receive instructions from your physician's office regarding next steps of care.  . When you arrive at healthcare provider, tell the healthcare staff immediately you have returned from  . visiting [place], [place], [place], [place] or [place]; or traveled in the [place] to [place], [place],  . [place] or [place] in the last two weeks or you have been in close contact with a person diagnosed with  . COVID-19 in the last 2 weeks.   . Tell the health care staff about your symptoms: fever, cough and shortness of breath. . After you have been seen by a medical provider, you will be either: o Tested  for (COVID-19) and discharged home on quarantine except to seek medical care if  o symptoms worsen, and asked to  - Stay home and avoid contact with others until you get your results (4-5 days)  - Avoid travel on public transportation if possible (such as bus, train, or airplane) or o Sent to the Emergency Department by EMS for evaluation, COVID-19 testing  and  o possible admission depending on your condition and test results.  What to do if you are LOW RISK for COVID-19?  Reduce your risk of any infection by using the same precautions used for avoiding the common cold or flu:  Marland Kitchen Wash your hands often with soap and warm water for at least 20 seconds.  If soap and water are not readily available,  . use an alcohol-based hand sanitizer with at least 60% alcohol.  . If coughing or sneezing, cover your mouth and nose by coughing or sneezing into the elbow areas of your shirt or coat, .  into a tissue or into your sleeve (not your hands). . Avoid shaking hands with others and consider head nods or verbal greetings only. . Avoid touching your eyes, nose, or mouth with unwashed hands.  . Avoid close contact with people who are sick. . Avoid places or events with large numbers of people in one  location, like concerts or sporting events. . Carefully consider travel plans you have or are making. . If you are planning any travel outside or inside the [place], visit the CDC's Travelers' Health webpage for the latest health notices. . If you have some symptoms but not all symptoms, continue to monitor at home and seek medical attention  . if your symptoms worsen. . If you are having a medical emergency, call 911.   ++++++++++++++++++++++++++++++++ Recommend Adult Low Dose Aspirin or  coated  Aspirin 81 mg daily  To reduce risk of Colon Cancer 40 %,  Skin Cancer 26 % ,  Melanoma 46%  and  Pancreatic cancer 60% ++++++++++++++++++++++++++++++++ Vitamin D goal  is between 70-100.  Please make sure  that you are taking your Vitamin D as directed.  It is very important as a natural anti-inflammatory  helping hair, skin, and nails, as well as reducing stroke and heart attack risk.  It helps your bones and helps with mood. It also decreases numerous cancer risks so please take it as directed.  Low Vit D is associated with a 200-300% higher risk for CANCER  and 200-300% higher risk for HEART   ATTACK  &  STROKE.   .....................................Marland Kitchen It is also associated with higher death rate at younger ages,  autoimmune diseases like Rheumatoid arthritis, Lupus, Multiple Sclerosis.    Also many other serious conditions, like depression, Alzheimer's Dementia, infertility, muscle aches, fatigue, fibromyalgia - just to name a few. ++++++++++++++++++++ Recommend the book "The END of DIETING" by Dr Excell Seltzer  & the book "The END of DIABETES " by Dr Excell Seltzer At [email or web address] - get book & Audio CD's    Being diabetic has a  300% increased risk for heart attack, stroke, cancer, and alzheimer- type vascular dementia. It is very important that you work harder with diet by avoiding all foods that are white. Avoid white rice (brown & wild rice is OK), white potatoes (sweetpotatoes in moderation is OK), White bread or wheat bread or anything made out of white flour like bagels, donuts, rolls, buns, biscuits, cakes, pastries, cookies, pizza crust, and pasta (made from white flour & egg whites) - vegetarian pasta or spinach or wheat pasta is OK. Multigrain breads like Arnold's or [employer], or multigrain sandwich thins or flatbreads.  Diet, exercise and weight loss can reverse and cure diabetes in the early stages.  Diet, exercise and weight loss is very important in the control and prevention of complications of diabetes which affects every system in your body, ie. Brain - dementia/stroke, eyes - glaucoma/blindness, heart - heart attack/heart failure, kidneys - dialysis, stomach - gastric  paralysis, intestines - malabsorption, nerves - severe painful neuritis, circulation - gangrene & loss of a leg(s), and finally cancer and Alzheimers.    I recommend avoid fried & greasy foods,  sweets/candy, white rice (brown or wild rice or Quinoa is OK), white potatoes (sweet potatoes are OK) - anything made from white flour - bagels, doughnuts, rolls, buns, biscuits,white and wheat breads, pizza crust and traditional pasta made of white flour & egg white(vegetarian pasta or spinach or wheat pasta is OK).  Multi-grain bread is OK - like multi-grain flat bread or sandwich thins. Avoid alcohol in excess. Exercise is also important.    Eat all the vegetables you want - avoid meat, especially red meat and dairy - especially cheese.  Cheese is the most concentrated form of trans-fats which is the worst thing to clog up our arteries.  Veggie cheese is OK which can be found in the fresh produce section at [employer] or [employer] or [employer]  +++++++++++++++++++++ DASH Eating Plan  DASH stands for "Dietary Approaches to Stop Hypertension."   The DASH eating plan is a healthy eating plan that has been shown to reduce high blood pressure (hypertension). Additional health benefits may include reducing the risk of type 2 diabetes mellitus, heart disease, and stroke. The DASH eating plan may also help with weight loss. WHAT DO I NEED TO KNOW ABOUT THE DASH EATING PLAN? For the DASH eating plan, you will follow these general guidelines:  Choose foods with a percent daily value for sodium of less than 5% (as listed on the food label).  Use salt-free seasonings or herbs instead of table salt or sea salt.  Check with your health care provider or pharmacist before using salt substitutes.  Eat lower-sodium products, often labeled as "lower sodium" or "no salt added."  Eat fresh foods.  Eat more vegetables, fruits, and low-fat dairy products.  Choose whole grains. Look for the word "whole" as the  first word in the ingredient list.  Choose fish   Limit sweets, desserts, sugars, and sugary drinks.  Choose heart-healthy fats.  Eat veggie cheese   Eat more home-cooked food and less restaurant, buffet, and fast food.  Limit fried foods.  Cook foods using methods other than frying.  Limit canned vegetables. If you do use them, rinse them well to decrease the sodium.  When eating at a restaurant, ask that your food be prepared with less salt, or no salt if possible.                      WHAT FOODS CAN I EAT? Read Dr Fara Olden Fuhrman's books on The End of Dieting & The End of Diabetes  Grains Whole grain or whole wheat bread. Brown rice. Whole grain or whole wheat pasta. Quinoa, bulgur, and whole grain cereals. Low-sodium cereals. Corn or whole wheat flour tortillas. Whole grain cornbread. Whole grain crackers. Low-sodium crackers.  Vegetables Fresh or frozen vegetables (raw, steamed, roasted, or grilled). Low-sodium or reduced-sodium tomato and vegetable juices. Low-sodium or reduced-sodium tomato sauce and paste. Low-sodium or reduced-sodium canned vegetables.   Fruits All fresh, canned (in natural juice), or frozen fruits.  Protein Products  All fish and seafood.  Dried beans, peas, or lentils. Unsalted nuts and seeds. Unsalted canned beans.  Dairy Low-fat dairy products, such as skim or 1% milk, 2% or reduced-fat cheeses, low-fat ricotta or cottage cheese, or plain low-fat yogurt. Low-sodium or reduced-sodium cheeses.  Fats and Oils Tub margarines without trans fats. Light or reduced-fat mayonnaise and salad dressings (reduced sodium). Avocado. Safflower, olive, or canola oils. Natural peanut or almond butter.  Other Unsalted popcorn and pretzels. The items listed above may not be a complete list of recommended foods or beverages. Contact your dietitian for more options.  +++++++++++++++  WHAT FOODS ARE NOT RECOMMENDED? Grains/ White flour or wheat flour White bread.  White pasta. White rice. Refined cornbread. Bagels and croissants. Crackers that contain trans fat.  Vegetables  Creamed or fried vegetables. Vegetables in a . Regular canned vegetables. Regular canned tomato sauce and paste. Regular tomato and vegetable juices.  Fruits Dried fruits. Canned fruit in light or heavy syrup. Fruit juice.  Meat and Other Protein Products Meat in general - RED meat & White meat.  Fatty cuts of meat. Ribs, chicken wings, all processed meats as bacon, sausage, bologna,  salami, fatback, hot dogs, bratwurst and packaged luncheon meats.  Dairy Whole or 2% milk, cream, half-and-half, and cream cheese. Whole-fat or sweetened yogurt. Full-fat cheeses or blue cheese. Non-dairy creamers and whipped toppings. Processed cheese, cheese spreads, or cheese curds.  Condiments Onion and garlic salt, seasoned salt, table salt, and sea salt. Canned and packaged gravies. Worcestershire sauce. Tartar sauce. Barbecue sauce. Teriyaki sauce. Soy sauce, including reduced sodium. Steak sauce. Fish sauce. Oyster sauce. Cocktail sauce. Horseradish. Ketchup and mustard. Meat flavorings and tenderizers. Bouillon cubes. Hot sauce. Tabasco sauce. Marinades. Taco seasonings. Relishes.  Fats and Oils Butter, stick margarine, lard, shortening and bacon fat. Coconut, palm kernel, or palm oils. Regular salad dressings.  Pickles and olives. Salted popcorn and pretzels.  The items listed above may not be a complete list of foods and beverages to avoid.

## 2019-04-01 NOTE — Progress Notes (Signed)
History of Present Illness:      This very nice 63 y.o. SBF presents for 6 month follow up with HTN, HLD, Morbid Obesity,  Pre-Diabetes and Vitamin D Deficiency. Patient has Gout controlled on her meds.      Patient is treated for HTN (1982) & BP has been controlled at home. Today's BP is at goal - 136/84. Patient has had no complaints of any cardiac type chest pain, palpitations, dyspnea / orthopnea / PND, dizziness, claudication, or dependent edema.      Hyperlipidemia is controlled with diet & meds. Patient denies myalgias or other med SE's. Last Lipids were near goal:  Lab Results  Component Value Date   CHOL 179 12/20/2018   HDL 63 12/20/2018   LDLCALC 101 (H) 12/20/2018   TRIG 61 12/20/2018   CHOLHDL 2.8 12/20/2018        Also, the patient has Morbid Obesity  (BMI  55)  and history of PreDiabetes (A1c 5.9% / 2011)  and has had no symptoms of reactive hypoglycemia, diabetic polys, paresthesias or visual blurring.  Last A1c was  Normal & at goal:  Lab Results  Component Value Date   HGBA1C 5.6 09/12/2018       Further, the patient also has history of Severe Vitamin D Deficiency ("18" / 2008) and does not  supplement vitamin D as advised. Last vitamin D was still very low:   Lab Results  Component Value Date   VD25OH 10 (L) 09/12/2018    Current Outpatient Medications on File Prior to Visit  Medication Sig  . allopurinol (ZYLOPRIM) 300 MG tablet TAKE 1 TABLET BY MOUTH EVERY DAY  . aspirin 81 MG tablet Take 81 mg by mouth daily.  . bumetanide (BUMEX) 1 MG tablet Take 1 tablet (1 mg total) by mouth as needed.  . doxazosin (CARDURA) 8 MG tablet Take 1 tablet at bedtime for BP  . gabapentin (NEURONTIN) 600 MG tablet TAKE 1 TABLET BY MOUTH THREE TIMES A DAY  . naproxen sodium (ANAPROX) 220 MG tablet Take 220 mg by mouth 2 (two) times daily with a meal.  . phentermine (ADIPEX-P) 37.5 MG tablet Take 1/2 to 1 tablet every morning for dieting & weightloss   No current  facility-administered medications on file prior to visit.    Allergies  Allergen Reactions  . Ace Inhibitors     Cough  . Losartan   . Paxil [Paroxetine Hcl]     Mood swings  . Prednisone     Nausea/ irratated  . Vitamin D Analogs     Cramping   PMHx:   Past Medical History:  Diagnosis Date  . Anemia   . DDD (degenerative disc disease)   . DJD (degenerative joint disease)   . Elevated hemoglobin A1c   . GERD (gastroesophageal reflux disease)   . Hyperlipidemia   . Hypertension   . Obesity   . Sickle cell trait ([facility])    Immunization History  Administered Date(s) Administered  . PPD Test 05/02/2013, 06/18/2014, 07/27/2016, 08/22/2017, 09/12/2018  . Td 07/01/2015   Past Surgical History:  Procedure Laterality Date  . SHOULDER SURGERY Left 2007   FHx:    Reviewed / unchanged  SHx:    Reviewed / unchanged   Systems Review:  Constitutional: Denies fever, chills, wt changes, headaches, insomnia, fatigue, night sweats, change in appetite. Eyes: Denies redness, blurred vision, diplopia, discharge, itchy, watery eyes.  ENT: Denies discharge, congestion, post nasal drip, epistaxis, sore  throat, earache, hearing loss, dental pain, tinnitus, vertigo, sinus pain, snoring.  CV: Denies chest pain, palpitations, irregular heartbeat, syncope, dyspnea, diaphoresis, orthopnea, PND, claudication or edema. Respiratory: denies cough, dyspnea, DOE, pleurisy, hoarseness, laryngitis, wheezing.  Gastrointestinal: Denies dysphagia, odynophagia, heartburn, reflux, water brash, abdominal pain or cramps, nausea, vomiting, bloating, diarrhea, constipation, hematemesis, melena, hematochezia  or hemorrhoids. Genitourinary: Denies dysuria, frequency, urgency, nocturia, hesitancy, discharge, hematuria or flank pain. Musculoskeletal: Denies arthralgias, myalgias, stiffness, jt. swelling, pain, limping or strain/sprain.  Skin: Denies pruritus, rash, hives, warts, acne, eczema or change in skin lesion(s).  Neuro: No weakness, tremor, incoordination, spasms, paresthesia or pain. Psychiatric: Denies confusion, memory loss or sensory loss. Endo: Denies change in weight, skin or hair change.  Heme/Lymph: No excessive bleeding, bruising or enlarged lymph nodes.  Physical Exam  BP 136/84   Pulse 72   Temp (!) 97 F (36.1 C)   Resp 16   Ht 5\' 7"  (1.702 m)   Wt (!) 343 lb (155.6 kg)   BMI 53.72 kg/m   Appears  Over nourished, well groomed  and in no distress.  Eyes: PERRLA, EOMs, conjunctiva no swelling or erythema. Sinuses: No frontal/maxillary tenderness ENT/Mouth: EAC's clear, TM's nl w/o erythema, bulging. Nares clear w/o erythema, swelling, exudates. Oropharynx clear without erythema or exudates. Oral hygiene is good. Tongue normal, non obstructing. Hearing intact.  Neck: Supple. Thyroid not palpable. Car 2+/2+ without bruits, nodes or JVD. Chest: Respirations nl with BS clear & equal w/o rales, rhonchi, wheezing or stridor.  Cor: Heart sounds normal w/ regular rate and rhythm without sig. murmurs, gallops, clicks or rubs. Peripheral pulses normal and equal  without edema.  Abdomen: Soft & bowel sounds normal. Non-tender w/o guarding, rebound, hernias, masses or organomegaly.  Lymphatics: Unremarkable.  Musculoskeletal: Full ROM all peripheral extremities, joint stability, 5/5 strength and normal gait.  Skin: Warm, dry without exposed rashes, lesions or ecchymosis apparent.  Neuro: Cranial nerves intact, reflexes equal bilaterally. Sensory-motor testing grossly intact. Tendon reflexes grossly intact.  Pysch: Alert & oriented x 3.  Insight and judgement nl & appropriate. No ideations.  Assessment and Plan:  1. Essential hypertension  - Continue medication, monitor blood pressure at home.  - Continue DASH diet.  Reminder to go to the ER if any CP,  SOB, nausea, dizziness, severe HA, changes vision/speech.  - CBC with Diff - COMPLETE METABOLIC PANEL WITH GFR - Magnesium - TSH   2. Hyperlipidemia, mixed  - Continue diet/meds, exercise,& lifestyle modifications.  - Continue monitor periodic cholesterol/liver & renal functions   - Lipid Profile - TSH  3. Abnormal glucose  - Continue diet, exercise  - Lifestyle modifications.  - Monitor appropriate labs.  - Hemoglobin A1c ([facility])  4. Vitamin D deficiency  - Continue supplementation.  - Vitamin D (25 hydroxy)  5. PreDiabetes  - Hemoglobin A1c ([facility]) - Insulin, random  6. Gout  - Uric acid  7. Medication management  - CBC with Diff - COMPLETE METABOLIC PANEL WITH GFR - Magnesium - Lipid Profile - TSH - Hemoglobin A1c ([facility]) - Insulin, random - Vitamin D (25 hydroxy) - Uric acid       Discussed  regular exercise, BP monitoring, weight control to achieve/maintain BMI less than 25 and discussed med and SE's. Recommended labs to assess and monitor clinical status with further disposition pending results of labs.  I discussed the assessment and treatment plan with the patient. The patient was provided an opportunity to ask questions and all were answered. The  patient agreed with the plan and demonstrated an understanding of the instructions.  I provided over 30 minutes of exam, counseling, chart review and  complex critical decision making.  Kirtland Bouchard, MD

## 2019-04-02 ENCOUNTER — Ambulatory Visit: Payer: 59 | Admitting: Internal Medicine

## 2019-04-02 ENCOUNTER — Other Ambulatory Visit: Payer: Self-pay

## 2019-04-02 VITALS — BP 136/84 | HR 72 | Temp 97.0°F | Resp 16 | Ht 67.0 in | Wt 343.0 lb

## 2019-04-02 DIAGNOSIS — E559 Vitamin D deficiency, unspecified: Secondary | ICD-10-CM | POA: Diagnosis not present

## 2019-04-02 DIAGNOSIS — M109 Gout, unspecified: Secondary | ICD-10-CM

## 2019-04-02 DIAGNOSIS — R7309 Other abnormal glucose: Secondary | ICD-10-CM

## 2019-04-02 DIAGNOSIS — I10 Essential (primary) hypertension: Secondary | ICD-10-CM | POA: Diagnosis not present

## 2019-04-02 DIAGNOSIS — E782 Mixed hyperlipidemia: Secondary | ICD-10-CM | POA: Diagnosis not present

## 2019-04-02 DIAGNOSIS — Z79899 Other long term (current) drug therapy: Secondary | ICD-10-CM

## 2019-04-03 LAB — LIPID PANEL
Cholesterol: 166 mg/dL (ref <200)
HDL: 59 mg/dL (ref >=50)
LDL Cholesterol (Calc): 92 mg/dL (calc)
Non-HDL Cholesterol (Calc): 107 mg/dL (calc) (ref <130)
Total CHOL/HDL Ratio: 2.8 (calc) (ref <5.0)
Triglycerides: 64 mg/dL (ref <150)

## 2019-04-03 LAB — CBC WITH DIFFERENTIAL/PLATELET
Absolute Monocytes: 442 cells/uL (ref 200–950)
Basophils Absolute: 27 cells/uL (ref 0–200)
Basophils Relative: 0.4 %
Eosinophils Absolute: 109 cells/uL (ref 15–500)
Eosinophils Relative: 1.6 %
HCT: 37.6 % (ref 35.0–45.0)
Hemoglobin: 12.6 g/dL (ref 11.7–15.5)
Lymphs Abs: 1918 cells/uL (ref 850–3900)
MCH: 29.2 pg (ref 27.0–33.0)
MCHC: 33.5 g/dL (ref 32.0–36.0)
MCV: 87.2 fL (ref 80.0–100.0)
MPV: 10.2 fL (ref 7.5–12.5)
Monocytes Relative: 6.5 %
Neutro Abs: 4304 cells/uL (ref 1500–7800)
Neutrophils Relative %: 63.3 %
Platelets: 224 10*3/uL (ref 140–400)
RBC: 4.31 10*6/uL (ref 3.80–5.10)
RDW: 13.3 % (ref 11.0–15.0)
Total Lymphocyte: 28.2 %
WBC: 6.8 10*3/uL (ref 3.8–10.8)

## 2019-04-03 LAB — COMPLETE METABOLIC PANEL WITH GFR
AG Ratio: 1.2 (calc) (ref 1.0–2.5)
ALT: 14 U/L (ref 6–29)
AST: 14 U/L (ref 10–35)
Albumin: 3.9 g/dL (ref 3.6–5.1)
Alkaline phosphatase (APISO): 121 U/L (ref 37–153)
BUN: 17 mg/dL (ref 7–25)
CO2: 28 mmol/L (ref 20–32)
Calcium: 9 mg/dL (ref 8.6–10.4)
Chloride: 102 mmol/L (ref 98–110)
Creat: 0.91 mg/dL (ref 0.50–0.99)
GFR, Est African American: 77 mL/min/{1.73_m2} (ref >=60)
GFR, Est Non African American: 67 mL/min/{1.73_m2} (ref >=60)
Globulin: 3.2 g/dL (calc) (ref 1.9–3.7)
Glucose, Bld: 115 mg/dL — ABNORMAL HIGH (ref 65–99)
Potassium: 4.2 mmol/L (ref 3.5–5.3)
Sodium: 139 mmol/L (ref 135–146)
Total Bilirubin: 1 mg/dL (ref 0.2–1.2)
Total Protein: 7.1 g/dL (ref 6.1–8.1)

## 2019-04-03 LAB — URIC ACID: Uric Acid, Serum: 6 mg/dL (ref 2.5–7.0)

## 2019-04-03 LAB — HEMOGLOBIN A1C
Hgb A1c MFr Bld: 5.5 % of total Hgb (ref <5.7)
Mean Plasma Glucose: 111 (calc)
eAG (mmol/L): 6.2 (calc)

## 2019-04-03 LAB — MAGNESIUM: Magnesium: 2 mg/dL (ref 1.5–2.5)

## 2019-04-03 LAB — VITAMIN D 25 HYDROXY (VIT D DEFICIENCY, FRACTURES): Vit D, 25-Hydroxy: 10 ng/mL — ABNORMAL LOW (ref 30–100)

## 2019-04-03 LAB — INSULIN, RANDOM: Insulin: 17.5 u[IU]/mL

## 2019-04-03 LAB — TSH: TSH: 2.17 mIU/L (ref 0.40–4.50)

## 2019-05-09 ENCOUNTER — Other Ambulatory Visit: Payer: Self-pay | Admitting: Internal Medicine

## 2019-05-09 DIAGNOSIS — I10 Essential (primary) hypertension: Secondary | ICD-10-CM

## 2019-07-03 ENCOUNTER — Ambulatory Visit: Payer: 59 | Admitting: Adult Health Nurse Practitioner

## 2019-07-21 ENCOUNTER — Other Ambulatory Visit: Payer: Self-pay | Admitting: Adult Health

## 2019-08-16 ENCOUNTER — Other Ambulatory Visit: Payer: Self-pay

## 2019-08-16 ENCOUNTER — Encounter: Payer: Self-pay | Admitting: Adult Health Nurse Practitioner

## 2019-08-16 ENCOUNTER — Ambulatory Visit: Payer: 59 | Admitting: Adult Health Nurse Practitioner

## 2019-08-16 VITALS — BP 142/94 | HR 85 | Temp 97.5°F | Ht 67.0 in | Wt 356.0 lb

## 2019-08-16 DIAGNOSIS — R7309 Other abnormal glucose: Secondary | ICD-10-CM | POA: Diagnosis not present

## 2019-08-16 DIAGNOSIS — M25511 Pain in right shoulder: Secondary | ICD-10-CM

## 2019-08-16 DIAGNOSIS — E782 Mixed hyperlipidemia: Secondary | ICD-10-CM | POA: Diagnosis not present

## 2019-08-16 DIAGNOSIS — M1000 Idiopathic gout, unspecified site: Secondary | ICD-10-CM

## 2019-08-16 DIAGNOSIS — E559 Vitamin D deficiency, unspecified: Secondary | ICD-10-CM | POA: Diagnosis not present

## 2019-08-16 DIAGNOSIS — I10 Essential (primary) hypertension: Secondary | ICD-10-CM

## 2019-08-16 DIAGNOSIS — Z6841 Body Mass Index (BMI) 40.0 and over, adult: Secondary | ICD-10-CM

## 2019-08-16 MED ORDER — METHOCARBAMOL 500 MG PO TABS: ORAL_TABLET | ORAL | 1 refills | Status: DC

## 2019-08-16 MED ORDER — MELOXICAM 15 MG PO TABS: ORAL_TABLET | ORAL | 1 refills | Status: DC

## 2019-08-16 NOTE — Patient Instructions (Addendum)
We will call you with lab results in 1-3 days.  Take the Meloxicam once daily with food for your shoulder.  This helps with inflammation.  Use heat to the back of your neck and ice to your shoulder.  Try the Robaxin at night to help relax the muscles.  It can make you sleepy.    Vit D  & Vit C 1,000 mg   are recommended to help protect  against the Covid-19 and other Corona viruses.    Also it's recommended  to take  Zinc 50 mg  to help  protect against the Covid-19   and best place to get  is also on [email or web address]  and don't pay more than 6-8 cents /pill !  ================================ Coronavirus (COVID-19) Are you at risk?  Are you at risk for the Coronavirus (COVID-19)?  To be considered HIGH RISK for Coronavirus (COVID-19), you have to meet the following criteria:  . Traveled to [place], [place], [place], [place] or [place]; or in the [place] to [place], [place], [place]  . or [place]; and have fever, cough, and shortness of breath within the last 2 weeks of travel OR . Been in close contact with a person diagnosed with COVID-19 within the last 2 weeks and have  . fever, cough,and shortness of breath .  . IF YOU DO NOT MEET THESE CRITERIA, YOU ARE CONSIDERED LOW RISK FOR COVID-19.  What to do if you are HIGH RISK for COVID-19?  Marland Kitchen If you are having a medical emergency, call 911. . Seek medical care right away. Before you go to a doctor's office, urgent care or emergency department, .  call ahead and tell them about your recent travel, contact with someone diagnosed with COVID-19  .  and your symptoms.  . You should receive instructions from your physician's office regarding next steps of care.  . When you arrive at healthcare provider, tell the healthcare staff immediately you have returned from  . visiting [place], [place], [place], [place] or [place]; or traveled in the [place] to [place], [place],  . [place] or [place] in the last two  weeks or you have been in close contact with a person diagnosed with  . COVID-19 in the last 2 weeks.   . Tell the health care staff about your symptoms: fever, cough and shortness of breath. . After you have been seen by a medical provider, you will be either: o Tested for (COVID-19) and discharged home on quarantine except to seek medical care if  o symptoms worsen, and asked to  - Stay home and avoid contact with others until you get your results (4-5 days)  - Avoid travel on public transportation if possible (such as bus, train, or airplane) or o Sent to the Emergency Department by EMS for evaluation, COVID-19 testing  and  o possible admission depending on your condition and test results.  What to do if you are LOW RISK for COVID-19?  Reduce your risk of any infection by using the same precautions used for avoiding the common cold or flu:  Marland Kitchen Wash your hands often with soap and warm water for at least 20 seconds.  If soap and water are not readily available,  . use an alcohol-based hand sanitizer with at least 60% alcohol.  . If coughing or sneezing, cover your mouth and nose by coughing or sneezing into the elbow areas of your shirt or coat, .  into a  tissue or into your sleeve (not your hands). . Avoid shaking hands with others and consider head nods or verbal greetings only. . Avoid touching your eyes, nose, or mouth with unwashed hands.  . Avoid close contact with people who are sick. . Avoid places or events with large numbers of people in one location, like concerts or sporting events. . Carefully consider travel plans you have or are making. . If you are planning any travel outside or inside the [place], visit the CDC's Travelers' Health webpage for the latest health notices. . If you have some symptoms but not all symptoms, continue to monitor at home and seek medical attention  . if your symptoms worsen. . If you are having a medical emergency, call  911.   . >>>>>>>>>>>>>>>>>>>>>>>>>>>>>>>>> . We Do NOT Approve of  [facility], [employer]  Our Patients  To Do Home Visits & We Do NOT Approve of [employer] > > > > > > > > > > > > > > > > > > > > > > > > > > > > > > > > > > > > > > >  Preventive Care for Adults  A healthy lifestyle and preventive care can promote health and wellness. Preventive health guidelines for women include the following key practices.  A routine yearly physical is a good way to check with your health care provider about your health and preventive screening. It is a chance to share any concerns and updates on your health and to receive a thorough exam.  Visit your dentist for a routine exam and preventive care every 6 months. Brush your teeth twice a day and floss once a day. Good oral hygiene prevents tooth decay and gum disease.  The frequency of eye exams is based on your age, health, family medical history, use of contact lenses, and other factors. Follow your health care provider's recommendations for frequency of eye exams.  Eat a healthy diet. Foods like vegetables, fruits, whole grains, low-fat dairy products, and lean protein foods contain the nutrients you need without too many calories. Decrease your intake of foods high in solid fats, added sugars, and salt. Eat the right amount of calories for you. Get information about a proper diet from your health care provider, if necessary.  Regular physical exercise is one of the most important things you can do for your health. Most adults should get at least 150 minutes of moderate-intensity exercise (any activity that increases your heart rate and causes you to sweat) each week. In addition, most adults need muscle-strengthening exercises on 2 or more days a week.  Maintain a healthy weight. The body mass index (BMI) is a screening tool to identify possible weight problems. It provides an estimate of body fat based on height and  weight. Your health care provider can find your BMI and can help you achieve or maintain a healthy weight. For adults 20 years and older:  A BMI below 18.5 is considered underweight.  A BMI of 18.5 to 24.9 is normal.  A BMI of 25 to 29.9 is considered overweight.  A BMI of 30 and above is considered obese.  Maintain normal blood lipids and cholesterol levels by exercising and minimizing your intake of saturated fat. Eat a balanced diet with plenty of fruit and vegetables. If your lipid or cholesterol levels are high, you are over 50, or you are at high risk for heart disease, you may need your cholesterol levels checked more  frequently. Ongoing high lipid and cholesterol levels should be treated with medicines if diet and exercise are not working.  If you smoke, find out from your health care provider how to quit. If you do not use tobacco, do not start.  Lung cancer screening is recommended for adults aged 55-80 years who are at high risk for developing lung cancer because of a history of smoking. A yearly low-dose CT scan of the lungs is recommended for people who have at least a 30-pack-year history of smoking and are a current smoker or have quit within the past 15 years. A pack year of smoking is smoking an average of 1 pack of cigarettes a day for 1 year (for example: 1 pack a day for 30 years or 2 packs a day for 15 years). Yearly screening should continue until the smoker has stopped smoking for at least 15 years. Yearly screening should be stopped for people who develop a health problem that would prevent them from having lung cancer treatment.  Avoid use of street drugs. Do not share needles with anyone. Ask for help if you need support or instructions about stopping the use of drugs.  High blood pressure causes heart disease and increases the risk of stroke.  Ongoing high blood pressure should be treated with medicines if weight loss and exercise do not work.  If you are 68-79 years  old, ask your health care provider if you should take aspirin to prevent strokes.  Diabetes screening involves taking a blood sample to check your fasting blood sugar level. This should be done once every 3 years, after age 24, if you are within normal weight and without risk factors for diabetes. Testing should be considered at a younger age or be carried out more frequently if you are overweight and have at least 1 risk factor for diabetes.  Breast cancer screening is essential preventive care for women. You should practice "breast self-awareness." This means understanding the normal appearance and feel of your breasts and may include breast self-examination. Any changes detected, no matter how small, should be reported to a health care provider. Women in their 54s and 30s should have a clinical breast exam (CBE) by a health care provider as part of a regular health exam every 1 to 3 years. After age 61, women should have a CBE every year. Starting at age 31, women should consider having a mammogram (breast X-ray test) every year. Women who have a family history of breast cancer should talk to their health care provider about genetic screening. Women at a high risk of breast cancer should talk to their health care providers about having an MRI and a mammogram every year.  Breast cancer gene (BRCA)-related cancer risk assessment is recommended for women who have family members with BRCA-related cancers. BRCA-related cancers include breast, ovarian, tubal, and peritoneal cancers. Having family members with these cancers may be associated with an increased risk for harmful changes (mutations) in the breast cancer genes BRCA1 and BRCA2. Results of the assessment will determine the need for genetic counseling and BRCA1 and BRCA2 testing.  Routine pelvic exams to screen for cancer are no longer recommended for nonpregnant women who are considered low risk for cancer of the pelvic organs (ovaries, uterus, and  vagina) and who do not have symptoms. Ask your health care provider if a screening pelvic exam is right for you.  If you have had past treatment for cervical cancer or a condition that could lead to cancer,  you need Pap tests and screening for cancer for at least 20 years after your treatment. If Pap tests have been discontinued, your risk factors (such as having a new sexual partner) need to be reassessed to determine if screening should be resumed. Some women have medical problems that increase the chance of getting cervical cancer. In these cases, your health care provider may recommend more frequent screening and Pap tests.    Colorectal cancer can be detected and often prevented. Most routine colorectal cancer screening begins at the age of 8 years and continues through age 70 years. However, your health care provider may recommend screening at an earlier age if you have risk factors for colon cancer. On a yearly basis, your health care provider may provide home test kits to check for hidden blood in the stool. Use of a small camera at the end of a tube, to directly examine the colon (sigmoidoscopy or colonoscopy), can detect the earliest forms of colorectal cancer. Talk to your health care provider about this at age 77, when routine screening begins.  Direct exam of the colon should be repeated every 5-10 years through age 63 years, unless early forms of pre-cancerous polyps or small growths are found.  Osteoporosis is a disease in which the bones lose minerals and strength with aging. This can result in serious bone fractures or breaks. The risk of osteoporosis can be identified using a bone density scan. Women ages 58 years and over and women at risk for fractures or osteoporosis should discuss screening with their health care providers. Ask your health care provider whether you should take a calcium supplement or vitamin D to reduce the rate of osteoporosis.  Menopause can be associated with  physical symptoms and risks. Hormone replacement therapy is available to decrease symptoms and risks. You should talk to your health care provider about whether hormone replacement therapy is right for you.  Use sunscreen. Apply sunscreen liberally and repeatedly throughout the day. You should seek shade when your shadow is shorter than you. Protect yourself by wearing long sleeves, pants, a wide-brimmed hat, and sunglasses year round, whenever you are outdoors.  Once a month, do a whole body skin exam, using a mirror to look at the skin on your back. Tell your health care provider of new moles, moles that have irregular borders, moles that are larger than a pencil eraser, or moles that have changed in shape or color.  Stay current with required vaccines (immunizations).  Influenza vaccine. All adults should be immunized every year.  Tetanus, diphtheria, and acellular pertussis (Td, Tdap) vaccine. Pregnant women should receive 1 dose of Tdap vaccine during each pregnancy. The dose should be obtained regardless of the length of time since the last dose. Immunization is preferred during the 27th-36th week of gestation. An adult who has not previously received Tdap or who does not know her vaccine status should receive 1 dose of Tdap. This initial dose should be followed by tetanus and diphtheria toxoids (Td) booster doses every 10 years. Adults with an unknown or incomplete history of completing a 3-dose immunization series with Td-containing vaccines should begin or complete a primary immunization series including a Tdap dose. Adults should receive a Td booster every 10 years.    Zoster vaccine. One dose is recommended for adults aged 73 years or older unless certain conditions are present.    Pneumococcal 13-valent conjugate (PCV13) vaccine. When indicated, a person who is uncertain of her immunization history and has no record  of immunization should receive the PCV13 vaccine. An adult aged 14  years or older who has certain medical conditions and has not been previously immunized should receive 1 dose of PCV13 vaccine. This PCV13 should be followed with a dose of pneumococcal polysaccharide (PPSV23) vaccine. The PPSV23 vaccine dose should be obtained at least 1 or more year(s) after the dose of PCV13 vaccine. An adult aged 51 years or older who has certain medical conditions and previously received 1 or more doses of PPSV23 vaccine should receive 1 dose of PCV13. The PCV13 vaccine dose should be obtained 1 or more years after the last PPSV23 vaccine dose.    Pneumococcal polysaccharide (PPSV23) vaccine. When PCV13 is also indicated, PCV13 should be obtained first. All adults aged 72 years and older should be immunized. An adult younger than age 72 years who has certain medical conditions should be immunized. Any person who resides in a nursing home or long-term care facility should be immunized. An adult smoker should be immunized. People with an immunocompromised condition and certain other conditions should receive both PCV13 and PPSV23 vaccines. People with human immunodeficiency virus (HIV) infection should be immunized as soon as possible after diagnosis. Immunization during chemotherapy or radiation therapy should be avoided. Routine use of PPSV23 vaccine is not recommended for American Indians, [place]atives, or people younger than 65 years unless there are medical conditions that require PPSV23 vaccine. When indicated, people who have unknown immunization and have no record of immunization should receive PPSV23 vaccine. One-time revaccination 5 years after the first dose of PPSV23 is recommended for people aged 19-64 years who have chronic kidney failure, nephrotic syndrome, asplenia, or immunocompromised conditions. People who received 1-2 doses of PPSV23 before age 25 years should receive another dose of PPSV23 vaccine at age 59 years or later if at least 5 years have passed since the  previous dose. Doses of PPSV23 are not needed for people immunized with PPSV23 at or after age 68 years.   Preventive Services / Frequency  Ages 53 years and over  Blood pressure check.  Lipid and cholesterol check.  Lung cancer screening. / Every year if you are aged 63-80 years and have a 30-pack-year history of smoking and currently smoke or have quit within the past 15 years. Yearly screening is stopped once you have quit smoking for at least 15 years or develop a health problem that would prevent you from having lung cancer treatment.  Clinical breast exam.** / Every year after age 13 years.   BRCA-related cancer risk assessment.** / For women who have family members with a BRCA-related cancer (breast, ovarian, tubal, or peritoneal cancers).  Mammogram.** / Every year beginning at age 22 years and continuing for as long as you are in good health. Consult with your health care provider.  Pap test.** / Every 3 years starting at age 74 years through age 52 or 33 years with 3 consecutive normal Pap tests. Testing can be stopped between 65 and 70 years with 3 consecutive normal Pap tests and no abnormal Pap or HPV tests in the past 10 years.  Fecal occult blood test (FOBT) of stool. / Every year beginning at age 68 years and continuing until age 19 years. You may not need to do this test if you get a colonoscopy every 10 years.  Flexible sigmoidoscopy or colonoscopy.** / Every 5 years for a flexible sigmoidoscopy or every 10 years for a colonoscopy beginning at age 36 years and continuing until age 50  years.  Hepatitis C blood test.** / For all people born from [birth year] through 1966 and any individual with known risks for hepatitis C.  Osteoporosis screening.** / A one-time screening for women ages 6 years and over and women at risk for fractures or osteoporosis.  Skin self-exam. / Monthly.  Influenza vaccine. / Every year.  Tetanus, diphtheria, and acellular pertussis (Tdap/Td)  vaccine.** / 1 dose of Td every 10 years.  Zoster vaccine.** / 1 dose for adults aged 17 years or older.  Pneumococcal 13-valent conjugate (PCV13) vaccine.** / Consult your health care provider.  Pneumococcal polysaccharide (PPSV23) vaccine.** / 1 dose for all adults aged 33 years and older. Screening for abdominal aortic aneurysm (AAA)  by ultrasound is recommended for people who have history of high blood pressure or who are current or former smokers. ++++++++++++++++++++ Recommend Adult Low Dose Aspirin or  coated  Aspirin 81 mg daily  To reduce risk of Colon Cancer 40 %,  Skin Cancer 26 % ,  Melanoma 46%  and  Pancreatic cancer 60% ++++++++++++++++++++ Vitamin D goal  is between 70-100.  Please make sure that you are taking your Vitamin D as directed.  It is very important as a natural anti-inflammatory  helping hair, skin, and nails, as well as reducing stroke and heart attack risk.  It helps your bones and helps with mood. It also decreases numerous cancer risks so please take it as directed.  Low Vit D is associated with a 200-300% higher risk for CANCER  and 200-300% higher risk for HEART   ATTACK  &  STROKE.   .....................................Marland Kitchen It is also associated with higher death rate at younger ages,  autoimmune diseases like Rheumatoid arthritis, Lupus, Multiple Sclerosis.    Also many other serious conditions, like depression, Alzheimer's Dementia, infertility, muscle aches, fatigue, fibromyalgia - just to name a few. ++++++++++++++++++ Recommend the book "The END of DIETING" by Dr Excell Seltzer  & the book "The END of DIABETES " by Dr Excell Seltzer At [email or web address] - get book & Audio CD's    Being diabetic has a  300% increased risk for heart attack, stroke, cancer, and alzheimer- type vascular dementia. It is very important that you work harder with diet by avoiding all foods that are white. Avoid white rice (brown & wild rice is OK), white potatoes (sweetpotatoes  in moderation is OK), White bread or wheat bread or anything made out of white flour like bagels, donuts, rolls, buns, biscuits, cakes, pastries, cookies, pizza crust, and pasta (made from white flour & egg whites) - vegetarian pasta or spinach or wheat pasta is OK. Multigrain breads like Arnold's or [employer], or multigrain sandwich thins or flatbreads.  Diet, exercise and weight loss can reverse and cure diabetes in the early stages.  Diet, exercise and weight loss is very important in the control and prevention of complications of diabetes which affects every system in your body, ie. Brain - dementia/stroke, eyes - glaucoma/blindness, heart - heart attack/heart failure, kidneys - dialysis, stomach - gastric paralysis, intestines - malabsorption, nerves - severe painful neuritis, circulation - gangrene & loss of a leg(s), and finally cancer and Alzheimers.    I recommend avoid fried & greasy foods,  sweets/candy, white rice (brown or wild rice or Quinoa is OK), white potatoes (sweet potatoes are OK) - anything made from white flour - bagels, doughnuts, rolls, buns, biscuits,white and wheat breads, pizza crust and traditional pasta made of white flour & egg white(vegetarian pasta or spinach  or wheat pasta is OK).  Multi-grain bread is OK - like multi-grain flat bread or sandwich thins. Avoid alcohol in excess. Exercise is also important.    Eat all the vegetables you want - avoid meat, especially red meat and dairy - especially cheese.  Cheese is the most concentrated form of trans-fats which is the worst thing to clog up our arteries. Veggie cheese is OK which can be found in the fresh produce section at [employer] or [employer] or [employer]  +++++++++++++++++++ DASH Eating Plan  DASH stands for "Dietary Approaches to Stop Hypertension."   The DASH eating plan is a healthy eating plan that has been shown to reduce high blood pressure (hypertension). Additional health benefits may include  reducing the risk of type 2 diabetes mellitus, heart disease, and stroke. The DASH eating plan may also help with weight loss. WHAT DO I NEED TO KNOW ABOUT THE DASH EATING PLAN? For the DASH eating plan, you will follow these general guidelines:  Choose foods with a percent daily value for sodium of less than 5% (as listed on the food label).  Use salt-free seasonings or herbs instead of table salt or sea salt.  Check with your health care provider or pharmacist before using salt substitutes.  Eat lower-sodium products, often labeled as "lower sodium" or "no salt added."  Eat fresh foods.  Eat more vegetables, fruits, and low-fat dairy products.  Choose whole grains. Look for the word "whole" as the first word in the ingredient list.  Choose fish   Limit sweets, desserts, sugars, and sugary drinks.  Choose heart-healthy fats.  Eat veggie cheese   Eat more home-cooked food and less restaurant, buffet, and fast food.  Limit fried foods.  Cook foods using methods other than frying.  Limit canned vegetables. If you do use them, rinse them well to decrease the sodium.  When eating at a restaurant, ask that your food be prepared with less salt, or no salt if possible.                      WHAT FOODS CAN I EAT? Read Dr Fara Olden Fuhrman's books on The End of Dieting & The End of Diabetes  Grains Whole grain or whole wheat bread. Brown rice. Whole grain or whole wheat pasta. Quinoa, bulgur, and whole grain cereals. Low-sodium cereals. Corn or whole wheat flour tortillas. Whole grain cornbread. Whole grain crackers. Low-sodium crackers.  Vegetables Fresh or frozen vegetables (raw, steamed, roasted, or grilled). Low-sodium or reduced-sodium tomato and vegetable juices. Low-sodium or reduced-sodium tomato sauce and paste. Low-sodium or reduced-sodium canned vegetables.   Fruits All fresh, canned (in natural juice), or frozen fruits.  Protein Products  All fish and seafood.  Dried  beans, peas, or lentils. Unsalted nuts and seeds. Unsalted canned beans.  Dairy Low-fat dairy products, such as skim or 1% milk, 2% or reduced-fat cheeses, low-fat ricotta or cottage cheese, or plain low-fat yogurt. Low-sodium or reduced-sodium cheeses.  Fats and Oils Tub margarines without trans fats. Light or reduced-fat mayonnaise and salad dressings (reduced sodium). Avocado. Safflower, olive, or canola oils. Natural peanut or almond butter.  Other Unsalted popcorn and pretzels. The items listed above may not be a complete list of recommended foods or beverages. Contact your dietitian for more options.  +++++++++++++++  WHAT FOODS ARE NOT RECOMMENDED? Grains/ White flour or wheat flour White bread. White pasta. White rice. Refined cornbread. Bagels and croissants. Crackers that contain trans fat.  Vegetables  Creamed or fried vegetables. Vegetables in a . Regular canned vegetables. Regular canned tomato sauce and paste. Regular tomato and vegetable juices.  Fruits Dried fruits. Canned fruit in light or heavy syrup. Fruit juice.  Meat and Other Protein Products Meat in general - RED meat & White meat.  Fatty cuts of meat. Ribs, chicken wings, all processed meats as bacon, sausage, bologna, salami, fatback, hot dogs, bratwurst and packaged luncheon meats.  Dairy Whole or 2% milk, cream, half-and-half, and cream cheese. Whole-fat or sweetened yogurt. Full-fat cheeses or blue cheese. Non-dairy creamers and whipped toppings. Processed cheese, cheese spreads, or cheese curds.  Condiments Onion and garlic salt, seasoned salt, table salt, and sea salt. Canned and packaged gravies. Worcestershire sauce. Tartar sauce. Barbecue sauce. Teriyaki sauce. Soy sauce, including reduced sodium. Steak sauce. Fish sauce. Oyster sauce. Cocktail sauce. Horseradish. Ketchup and mustard. Meat flavorings and tenderizers. Bouillon cubes. Hot sauce. Tabasco sauce. Marinades. Taco seasonings.  Relishes.  Fats and Oils Butter, stick margarine, lard, shortening and bacon fat. Coconut, palm kernel, or palm oils. Regular salad dressings.  Pickles and olives. Salted popcorn and pretzels.  The items listed above may not be a complete list of foods and beverages to avoid.

## 2019-08-16 NOTE — Progress Notes (Signed)
FOLLOW UP 3 MONTH  Assessment and Plan:   Christene was seen today for follow-up.  Diagnoses and all orders for this visit:  Acute pain of right shoulder -     meloxicam (MOBIC) 15 MG tablet; Take one daily with food for 2 weeks, can take with tylenol, can not take with aleve, iburpofen, then as needed daily for pain -     methocarbamol (ROBAXIN) 500 MG tablet; Take one tablet every 8 hours as needed for muscle spasms.  Idiopathic gout, unspecified chronicity, unspecified site Continue allopurinol 300mg  daily No recent flares Discussed dietary modifications Continue to monitor  Hypertension Elevated in office today -discussed goal -, CHECK BLOOD PRESSURE AT HOME Increase activity and reduce salt intake Monitor blood pressure at home; patient to call if consistently greater than 130/80 Continue DASH diet.   Reminder to go to the ER if any CP, SOB, nausea, dizziness, severe HA, changes vision/speech, left arm numbness and tingling and jaw pain.  Hyperlipademia Currently managed by lifestyle with LDLs at goal of LDL <100 Continue low cholesterol diet and exercise.  Check lipid panel.   Other abnormal glucose Discussed risks associated with morbid obesity and elevated glucose Continue diet and exercise.  Perform daily foot/skin check, notify office of any concerning changes.  Defer A1C to CPE; monitor weights, check CMP/GFR  Morbid obesity with BMI of 50.0-59.9, adult ([place]) Long discussion about weight loss, diet, and exercise.  Increase activity even walking. Stress and time constraints with job are significant limitations; strategies discussed Recommended diet heavy in fruits and veggies and low in animal meats, cheeses, and dairy products, appropriate calorie intake Discussed ideal weight for height  Patient knows she needs to prioritize health and weight- suggested sleep management, stress reduction, increase fluids, find easy healthy snacks and meals that she can grab and  go Weight goal set for 325 lb; she has successfully lost significant amounts of weight in the past, knows what she needs to do Will follow up in 3 months  Vitamin D Def She has not been supplementing despite recommendations; risks discussed - she agrees to initiate.  Reports this cause muscle cramps Education provided Discussed gummies? Defer Vit D level  Continue diet and meds as discussed. Further disposition pending results of labs. Discussed med's effects and SE's.   Over 30 minutes of face to face interview, exam, counseling, chart review, and critical decision making was performed.   Future Appointments  Date Time Provider [place]  09/11/2019 10:45 AM Dodson, Romilda Garret, [place] [facility]  09/18/2019  9:00 AM Unk Pinto, MD [facility] None    ----------------------------------------------------------------------------------------------------------------------  HPI 64 y.o. female  presents for 3 month follow up on HTN, HLD, history of prediabetes/abnormal glucose, morbid obesity (BMI 50+) and vitamin D deficiency.   Right shoulder has been bother her.  Reports it is throbbing and aching.  Reports she turns off of it and rubs it to help.  She reports it occasionaly wakes her in the night.  She has taken alieve three times a day to help.   She reports this gets her through the day but she still have pain.  She denies any numbness or tingling.  Denies any weakness.  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   She also reports  She reports she has been going through increased stress related to jobs changes and now having to drive to [facility] for her job.  BMI is Body mass index is 55.76 kg/m., she has not been working on diet and exercise. Admits she will commonly not eat all day then eat one very large meal at the end of the day. Not currently checking weights, needs new scale.  Wt Readings from Last 3  Encounters:  08/16/19 (!) 356 lb (161.5 kg)  04/02/19 (!) 343 lb (155.6 kg)  12/20/18 (!) 351 lb (159.2 kg)   She reports she has not checked her BP recently but bought new BP cuff, today their BP is BP: (!) 142/94 by provider manual recheck  She does not workout. She denies chest pain, shortness of breath, dizziness.   She is not on cholesterol medication and denies myalgias. Her cholesterol is at goal. The cholesterol last visit was:   Lab Results  Component Value Date   CHOL 166 04/02/2019   HDL 59 04/02/2019   LDLCALC 92 04/02/2019   TRIG 64 04/02/2019   CHOLHDL 2.8 04/02/2019    She has not been working on diet and exercise for history of prediabetes, and denies increased appetite, nausea, paresthesia of the feet, polydipsia, polyuria, visual disturbances, vomiting and weight loss. Last A1C in the office was:  Lab Results  Component Value Date   HGBA1C 5.5 04/02/2019   Patient is not regular on Vitamin D supplement and remains very low at last check:   Lab Results  Component Value Date   VD25OH 10 (L) 04/02/2019     Patient is on allopurinol for gout and does not report a recent flare.  Lab Results  Component Value Date   LABURIC 6.0 04/02/2019      Current Medications:  Current Outpatient Medications on File Prior to Visit  Medication Sig  . allopurinol (ZYLOPRIM) 300 MG tablet TAKE 1 TABLET BY MOUTH EVERY DAY  . aspirin 81 MG tablet Take 81 mg by mouth daily.  . bumetanide (BUMEX) 1 MG tablet Take 1 tablet (1 mg total) by mouth as needed.  . doxazosin (CARDURA) 8 MG tablet Take 1 tablet at bedtime for BP  . gabapentin (NEURONTIN) 600 MG tablet Take 1/2 to 1 tablet 3 x /day for Chronic Pain  . naproxen sodium (ANAPROX) 220 MG tablet Take 220 mg by mouth 2 (two) times daily with a meal.  . phentermine (ADIPEX-P) 37.5 MG tablet Take 1/2 to 1 tablet every morning for dieting & weightloss (Patient not taking: Reported on 08/16/2019)   No current facility-administered  medications on file prior to visit.     Allergies:  Allergies  Allergen Reactions  . Ace Inhibitors     Cough  . Losartan   . Paxil [Paroxetine Hcl]     Mood swings  . Prednisone     Nausea/ irratated  . Vitamin D Analogs     Cramping     Medical History:  Past Medical History:  Diagnosis Date  . Anemia   . DDD (degenerative disc disease)   . DJD (degenerative joint disease)   . Elevated hemoglobin A1c   . GERD (gastroesophageal reflux disease)   . Hyperlipidemia   . Hypertension   . Obesity   . Sickle cell trait ([place])    Family history- Reviewed and unchanged Social history- Reviewed and unchanged   Review of Systems:  Review of Systems  Constitutional: Negative for malaise/fatigue and weight loss.  HENT: Negative for hearing loss and tinnitus.   Eyes: Negative for blurred vision and double vision.  Respiratory: Negative for cough, shortness of breath and  wheezing.   Cardiovascular: Negative for chest pain, palpitations, orthopnea, claudication and leg swelling.  Gastrointestinal: Negative for abdominal pain, blood in stool, constipation, diarrhea, heartburn, melena, nausea and vomiting.  Genitourinary: Negative.   Musculoskeletal: Negative for joint pain and myalgias.  Skin: Negative for rash.  Neurological: Negative for dizziness, tingling, sensory change, weakness and headaches.  Endo/Heme/Allergies: Negative for polydipsia.  Psychiatric/Behavioral: Negative.  Negative for depression. The patient is not nervous/anxious and does not have insomnia.   All other systems reviewed and are negative.   Physical Exam: BP (!) 142/94   Pulse 85   Temp (!) 97.5 F (36.4 C)   Ht 5\' 7"  (1.702 m)   Wt (!) 356 lb (161.5 kg)   SpO2 99%   BMI 55.76 kg/m  Wt Readings from Last 3 Encounters:  08/16/19 (!) 356 lb (161.5 kg)  04/02/19 (!) 343 lb (155.6 kg)  12/20/18 (!) 351 lb (159.2 kg)   General Appearance: Well nourished, morbidly obese, in no apparent  distress. Eyes: PERRLA, EOMs, conjunctiva no swelling or erythema Sinuses: No Frontal/maxillary tenderness ENT/Mouth: Ext aud canals clear, TMs without erythema, bulging. No erythema, swelling, or exudate on post pharynx.  Tonsils not swollen or erythematous. Hearing normal.  Neck: Supple, thyroid normal.  Respiratory: Respiratory effort normal, BS equal bilaterally without rales, rhonchi, wheezing or stridor.  Cardio: Distant heart sounds due to body habitus; RRR with no audible MRGs. Brisk peripheral pulses with some non-pitting edema to bilateral ankles.  Abdomen: Soft, obese abdomen, + BS.  Non tender, no guarding, rebound, hernias, masses. Lymphatics: Non tender without lymphadenopathy.  Musculoskeletal: Full ROM, 5/5 strength, Normal gait Skin: Warm, dry without rashes, lesions, ecchymosis.  Neuro: Cranial nerves intact. No cerebellar symptoms.  Psych: Awake and oriented X 3, normal affect, Insight and Judgment appropriate.    Garnet Sierras, NP 12:02 PM [facility]

## 2019-08-17 LAB — CBC WITH DIFFERENTIAL/PLATELET
Absolute Monocytes: 441 cells/uL (ref 200–950)
Basophils Absolute: 41 cells/uL (ref 0–200)
Basophils Relative: 0.7 %
Eosinophils Absolute: 128 cells/uL (ref 15–500)
Eosinophils Relative: 2.2 %
HCT: 39.5 % (ref 35.0–45.0)
Hemoglobin: 12.6 g/dL (ref 11.7–15.5)
Lymphs Abs: 1560 cells/uL (ref 850–3900)
MCH: 27.5 pg (ref 27.0–33.0)
MCHC: 31.9 g/dL — ABNORMAL LOW (ref 32.0–36.0)
MCV: 86.2 fL (ref 80.0–100.0)
MPV: 10 fL (ref 7.5–12.5)
Monocytes Relative: 7.6 %
Neutro Abs: 3631 cells/uL (ref 1500–7800)
Neutrophils Relative %: 62.6 %
Platelets: 229 10*3/uL (ref 140–400)
RBC: 4.58 10*6/uL (ref 3.80–5.10)
RDW: 13.2 % (ref 11.0–15.0)
Total Lymphocyte: 26.9 %
WBC: 5.8 10*3/uL (ref 3.8–10.8)

## 2019-08-17 LAB — TSH: TSH: 3.62 mIU/L (ref 0.40–4.50)

## 2019-08-17 LAB — COMPLETE METABOLIC PANEL WITH GFR
AG Ratio: 1.1 (calc) (ref 1.0–2.5)
ALT: 16 U/L (ref 6–29)
AST: 18 U/L (ref 10–35)
Albumin: 4.1 g/dL (ref 3.6–5.1)
Alkaline phosphatase (APISO): 135 U/L (ref 37–153)
BUN: 13 mg/dL (ref 7–25)
CO2: 26 mmol/L (ref 20–32)
Calcium: 9.3 mg/dL (ref 8.6–10.4)
Chloride: 100 mmol/L (ref 98–110)
Creat: 0.84 mg/dL (ref 0.50–0.99)
GFR, Est African American: 85 mL/min/{1.73_m2} (ref >=60)
GFR, Est Non African American: 73 mL/min/{1.73_m2} (ref >=60)
Globulin: 3.6 g/dL (calc) (ref 1.9–3.7)
Glucose, Bld: 125 mg/dL — ABNORMAL HIGH (ref 65–99)
Potassium: 4.5 mmol/L (ref 3.5–5.3)
Sodium: 137 mmol/L (ref 135–146)
Total Bilirubin: 1 mg/dL (ref 0.2–1.2)
Total Protein: 7.7 g/dL (ref 6.1–8.1)

## 2019-08-17 LAB — LIPID PANEL
Cholesterol: 186 mg/dL (ref <200)
HDL: 66 mg/dL (ref >=50)
LDL Cholesterol (Calc): 105 mg/dL (calc) — ABNORMAL HIGH
Non-HDL Cholesterol (Calc): 120 mg/dL (calc) (ref <130)
Total CHOL/HDL Ratio: 2.8 (calc) (ref <5.0)
Triglycerides: 67 mg/dL (ref <150)

## 2019-08-17 LAB — MAGNESIUM: Magnesium: 2.2 mg/dL (ref 1.5–2.5)

## 2019-08-17 LAB — HEMOGLOBIN A1C
Hgb A1c MFr Bld: 5.6 % of total Hgb (ref <5.7)
Mean Plasma Glucose: 114 (calc)
eAG (mmol/L): 6.3 (calc)

## 2019-08-17 LAB — VITAMIN D 25 HYDROXY (VIT D DEFICIENCY, FRACTURES): Vit D, 25-Hydroxy: 8 ng/mL — ABNORMAL LOW (ref 30–100)

## 2019-09-11 ENCOUNTER — Encounter: Payer: Self-pay | Admitting: Podiatry

## 2019-09-11 ENCOUNTER — Other Ambulatory Visit: Payer: Self-pay

## 2019-09-11 ENCOUNTER — Ambulatory Visit: Payer: 59 | Admitting: Podiatry

## 2019-09-11 DIAGNOSIS — B351 Tinea unguium: Secondary | ICD-10-CM

## 2019-09-11 DIAGNOSIS — M79676 Pain in unspecified toe(s): Secondary | ICD-10-CM | POA: Diagnosis not present

## 2019-09-11 DIAGNOSIS — S39011A Strain of muscle, fascia and tendon of abdomen, initial encounter: Secondary | ICD-10-CM | POA: Insufficient documentation

## 2019-09-11 DIAGNOSIS — R159 Full incontinence of feces: Secondary | ICD-10-CM | POA: Insufficient documentation

## 2019-09-11 DIAGNOSIS — R87619 Unspecified abnormal cytological findings in specimens from cervix uteri: Secondary | ICD-10-CM | POA: Insufficient documentation

## 2019-09-11 DIAGNOSIS — IMO0002 Reserved for concepts with insufficient information to code with codable children: Secondary | ICD-10-CM | POA: Insufficient documentation

## 2019-09-11 DIAGNOSIS — D500 Iron deficiency anemia secondary to blood loss (chronic): Secondary | ICD-10-CM | POA: Insufficient documentation

## 2019-09-11 NOTE — Progress Notes (Signed)
She presents today chief complaint of painfully elongated toenails.  Objective: Vital signs are stable alert oriented x3.  Pulses are palpable.  Toenails are long thick yellow dystrophic-like mycotic painful on palpation as well as debridement.  Assessment: Pain limb secondary onychomycosis of  Plan: Discussed etiology pathology conservative therapies debrided 1 through 5 bilaterally.  Follow-up with her as needed.

## 2019-09-18 ENCOUNTER — Encounter: Payer: 59 | Admitting: Internal Medicine

## 2019-10-19 ENCOUNTER — Other Ambulatory Visit: Payer: Self-pay | Admitting: Adult Health Nurse Practitioner

## 2019-10-19 DIAGNOSIS — M25511 Pain in right shoulder: Secondary | ICD-10-CM

## 2019-10-22 ENCOUNTER — Other Ambulatory Visit: Payer: Self-pay | Admitting: Adult Health Nurse Practitioner

## 2019-11-16 NOTE — Progress Notes (Signed)
COMPLETE PHYSICAL  Assessment and Plan:   Encounter for general adult medical examination with abnormal findings Needs Prevnar- declines Given number- she is overdue for colonscopy  Sickle cell trait ([place]) Monitor  Morbid obesity ([place]) - follow up 3 months for progress monitoring - increase veggies, decrease carbs - long discussion about weight loss, diet, and exercise Discussed Wegovy, will research it and get back to me  Anemia due to blood loss Monitor  Abnormal glucose -     Hemoglobin A1c Discussed disease progression and risks Discussed diet/exercise, weight management and risk modification  Hyperlipidemia, mixed -     Lipid panel check lipids decrease fatty foods increase activity.   Essential hypertension - continue medications, DASH diet, exercise and monitor at home. Call if greater than 130/80.  -     CBC with Differential/Platelet -     COMPLETE METABOLIC PANEL WITH GFR -     TSH -     Urinalysis, Routine w reflex microscopic -     Microalbumin / creatinine urine ratio -     EKG 12-Lead  Vitamin D deficiency -     VITAMIN D 25 Hydroxy (Vit-D Deficiency, Fractures)  Idiopathic gout, unspecified chronicity, unspecified site Monitor  Rectocele Monitor  Abnormal cervical Papanicolaou smear, unspecified abnormal pap finding Follows with GYN  Chronic pain of both knees -     Ambulatory referral to Orthopedic Surgery Discussed weight loss- will think about new injectable medications- will not go on medicare x 2 year  Medication management -     Magnesium  Estradiol deficiency -     DG Bone Density; Future - get with MGM in sept    Continue diet and meds as discussed. Further disposition pending results of labs. Discussed med's effects and SE's.   Over 30 minutes of exam, counseling, chart review, and critical decision making was performed.   Future Appointments  Date Time Provider [place]  11/19/2020 10:00 AM Vicie Mutters, PA-C  [facility] None    ----------------------------------------------------------------------------------------------------------------------  HPI 64 y.o. female  presents for 3 month follow up on hypertension, cholesterol, history of prediabetes, morbid obesity (BMI 50+) and vitamin D deficiency.   She has bilateral knee OA, last saw Dr. Mayer Camel in 2015, states her right knee has been bothering her more. She has to use a cane occ because she feels she will fall, she has pain. She just started to take mobic again.   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   BMI is Body mass index is 55.44 kg/m., she has not been working on diet and exercise. Admits she will commonly not eat all day then eat one very large meal at the end of the day.  She is in agreement she needs to make changes. Not currently checking weights, needs new scale.  Wt Readings from Last 3 Encounters:  11/19/19 (!) 354 lb (160.6 kg)  08/16/19 (!) 356 lb (161.5 kg)  04/02/19 (!) 343 lb (155.6 kg)   She reports she has not checked her BP recently but bought new BP cuff, today their BP is BP: 134/76 by provider manual recheck  She does not workout. She denies chest pain, shortness of breath, dizziness.   She is not on cholesterol medication and denies myalgias. Her cholesterol is at goal. The cholesterol last visit was:   Lab Results  Component Value Date  CHOL 186 08/16/2019   HDL 66 08/16/2019   LDLCALC 105 (H) 08/16/2019   TRIG 67 08/16/2019   CHOLHDL 2.8 08/16/2019    She has not been working on diet and exercise for history of prediabetes, and denies increased appetite, nausea, paresthesia of the feet, polydipsia, polyuria, visual disturbances, vomiting and weight loss. Last A1C in the office was:  Lab Results  Component Value Date   HGBA1C 5.6 08/16/2019   Patient is not regular on Vitamin D supplement and remains very  low at last check:   Lab Results  Component Value Date   VD25OH 8 (L) 08/16/2019     Patient is on allopurinol for gout and does not report a recent flare.  Lab Results  Component Value Date   LABURIC 6.0 04/02/2019      Current Medications:    Current Outpatient Medications (Cardiovascular):    bumetanide (BUMEX) 1 MG tablet, Take 1 tablet (1 mg total) by mouth as needed.   doxazosin (CARDURA) 8 MG tablet, Take 1 tablet at bedtime for BP   Current Outpatient Medications (Analgesics):    allopurinol (ZYLOPRIM) 300 MG tablet, Take 1 tablet Daily to Prevent Gout   aspirin 81 MG tablet, Take 81 mg by mouth daily.   meloxicam (MOBIC) 15 MG tablet, TAKE 1 TAB DAILY WITH FOOD FOR 2 WEEKS, CAN TAKE WITH TYLENOL, CAN NOT TAKE WITH ALEVE, IBURPOFEN, THEN AS NEEDED DAILY FOR PAIN   naproxen sodium (ANAPROX) 220 MG tablet, Take 220 mg by mouth 2 (two) times daily with a meal.   Current Outpatient Medications (Other):    gabapentin (NEURONTIN) 600 MG tablet, Take 1/2 to 1 tablet 3 x /day for Chronic Pain   methocarbamol (ROBAXIN) 500 MG tablet, Take one tablet every 8 hours as needed for muscle spasms.   Allergies:  Allergies  Allergen Reactions   Ace Inhibitors     Cough   Losartan    Paxil [Paroxetine Hcl]     Mood swings   Prednisone     Nausea/ irratated   Vitamin D Analogs     Cramping     Medical History:  Past Medical History:  Diagnosis Date   Anemia    DDD (degenerative disc disease)    DJD (degenerative joint disease)    Elevated hemoglobin A1c    GERD (gastroesophageal reflux disease)    Hyperlipidemia    Hypertension    Obesity    Sickle cell trait ([place])    Immunization History  Administered Date(s) Administered   PFIZER SARS-COV-2 Vaccination 08/22/2019, 09/12/2019   PPD Test 05/02/2013, 06/18/2014, 07/27/2016, 08/22/2017, 09/12/2018   Td 07/01/2015   Health Maintenance  Topic Date Due   PAP SMEAR-Modifier  Never done    DEXA SCAN  Never done   PNA vac Low Risk Adult (1 of 2 - PCV13) Never done   COLONOSCOPY  06/25/2019   INFLUENZA VACCINE  12/23/2019   MAMMOGRAM  01/25/2020   TETANUS/TDAP  06/30/2025   COVID-19 Vaccine  Completed   Hepatitis C Screening  Completed   HIV Screening  Completed   PAP DR. HENLEY 2020 DEXA DUE WILL SCHEDULE WITH MAMMOGRAM MGM 01/2019 Colonoscopy 2011 SHE IS OVER DUE PREVNAR: WANTS TO WAIT  SURGICAL HISTORY She  has a past surgical history that includes Shoulder surgery (Left, 2007). FAMILY HISTORY Her family history includes Cancer in her father; Diabetes in her brother and sister; Heart disease in her mother; Mental illness in her sister; Multiple myeloma in her father;  Obstructive Sleep Apnea in her brother. SOCIAL HISTORY She  reports that she has never smoked. She has never used smokeless tobacco. She reports current alcohol use of about 6.0 standard drinks of alcohol per week.   Review of Systems:  Review of Systems  Constitutional: Negative for malaise/fatigue and weight loss.  HENT: Negative for hearing loss and tinnitus.   Eyes: Negative for blurred vision and double vision.  Respiratory: Negative for cough, shortness of breath and wheezing.   Cardiovascular: Negative for chest pain, palpitations, orthopnea, claudication and leg swelling.  Gastrointestinal: Negative for abdominal pain, blood in stool, constipation, diarrhea, heartburn, melena, nausea and vomiting.  Genitourinary: Negative.   Musculoskeletal: Negative for joint pain and myalgias.  Skin: Negative for rash.  Neurological: Negative for dizziness, tingling, sensory change, weakness and headaches.  Endo/Heme/Allergies: Negative for polydipsia.  Psychiatric/Behavioral: Negative.  Negative for depression. The patient is not nervous/anxious and does not have insomnia.   All other systems reviewed and are negative.   Physical Exam: BP 134/76    Pulse 76    Temp (!) 97.2 F (36.2 C)    Ht  _0  (1.702 m)    Wt (!) 354 lb (160.6 kg)    SpO2 98%    BMI 55.44 kg/m  Wt Readings from Last 3 Encounters:  11/19/19 (!) 354 lb (160.6 kg)  08/16/19 (!) 356 lb (161.5 kg)  04/02/19 (!) 343 lb (155.6 kg)   General Appearance: Well nourished, morbidly obese, in no apparent distress. Eyes: PERRLA, EOMs, conjunctiva no swelling or erythema Sinuses: No Frontal/maxillary tenderness ENT/Mouth: Ext aud canals clear, TMs without erythema, bulging. No erythema, swelling, or exudate on post pharynx.  Tonsils not swollen or erythematous. Hearing normal.  Neck: Supple, thyroid normal.  Respiratory: Respiratory effort normal, BS equal bilaterally without rales, rhonchi, wheezing or stridor.  Cardio: Distant heart sounds due to body habitus; RRR with no audible MRGs. Brisk peripheral pulses with some non-pitting edema to bilateral ankles.  Abdomen: Soft, obese abdomen, + BS.  Non tender, no guarding, rebound, hernias, masses. Lymphatics: Non tender without lymphadenopathy.  Musculoskeletal: Full ROM, 5/5 strength, Normal gait Skin: Warm, dry without rashes, lesions, ecchymosis.  Neuro: Cranial nerves intact. No cerebellar symptoms.  Psych: Awake and oriented X 3, normal affect, Insight and Judgment appropriate.    Vicie Mutters, PA-C 10:50 AM [facility]

## 2019-11-19 ENCOUNTER — Other Ambulatory Visit: Payer: Self-pay

## 2019-11-19 ENCOUNTER — Ambulatory Visit: Payer: 59 | Admitting: Physician Assistant

## 2019-11-19 ENCOUNTER — Encounter: Payer: Self-pay | Admitting: Physician Assistant

## 2019-11-19 ENCOUNTER — Encounter: Payer: 59 | Admitting: Internal Medicine

## 2019-11-19 VITALS — BP 134/76 | HR 76 | Temp 97.2°F | Ht 67.0 in | Wt 354.0 lb

## 2019-11-19 DIAGNOSIS — R87619 Unspecified abnormal cytological findings in specimens from cervix uteri: Secondary | ICD-10-CM

## 2019-11-19 DIAGNOSIS — E348 Other specified endocrine disorders: Secondary | ICD-10-CM

## 2019-11-19 DIAGNOSIS — E559 Vitamin D deficiency, unspecified: Secondary | ICD-10-CM

## 2019-11-19 DIAGNOSIS — D573 Sickle-cell trait: Secondary | ICD-10-CM

## 2019-11-19 DIAGNOSIS — I10 Essential (primary) hypertension: Secondary | ICD-10-CM | POA: Diagnosis not present

## 2019-11-19 DIAGNOSIS — Z0000 Encounter for general adult medical examination without abnormal findings: Secondary | ICD-10-CM | POA: Diagnosis not present

## 2019-11-19 DIAGNOSIS — Z0001 Encounter for general adult medical examination with abnormal findings: Secondary | ICD-10-CM

## 2019-11-19 DIAGNOSIS — E782 Mixed hyperlipidemia: Secondary | ICD-10-CM

## 2019-11-19 DIAGNOSIS — N816 Rectocele: Secondary | ICD-10-CM

## 2019-11-19 DIAGNOSIS — G8929 Other chronic pain: Secondary | ICD-10-CM

## 2019-11-19 DIAGNOSIS — R7309 Other abnormal glucose: Secondary | ICD-10-CM

## 2019-11-19 DIAGNOSIS — D500 Iron deficiency anemia secondary to blood loss (chronic): Secondary | ICD-10-CM

## 2019-11-19 DIAGNOSIS — Z136 Encounter for screening for cardiovascular disorders: Secondary | ICD-10-CM | POA: Diagnosis not present

## 2019-11-19 DIAGNOSIS — Z79899 Other long term (current) drug therapy: Secondary | ICD-10-CM

## 2019-11-19 DIAGNOSIS — M1000 Idiopathic gout, unspecified site: Secondary | ICD-10-CM

## 2019-11-19 NOTE — Patient Instructions (Addendum)
Look up the new weight loss medication, JMEQAS- it is a once a week weight loss medication.   CALL DR. Mayer Camel Phone: [phone];    CHECK OUT LIQUID OR SPRAY FORM OF VITAMIN D ON [employer] TRY THAT, IF YOU STILL GET CRAMPS, CAN STOP  Going to refer for colonoscopy- PLEASE CALL Phone: [phone]  Colon cancer is 3rd most diagnosed cancer and 2nd leading cause of death in both men and women 65 years of age and older despite being one of the most preventable and treatable cancers if found early.  4 of out 5 people diagnosed with colon cancer have NO prior family history.  When caught EARLY 90% of colon cancer is curable.  Mobic is an antiinflammatory It helps pain, can not take with aleve, or ibuprofen You can take tylenol (500mg ) or tylenol arthritis (650mg ) with the meloxicam/antiinflammatories. The max you can take of tylenol a day is 3000mg  daily, this is a max of 6 pills a day of the regular tyelnol (500mg ) or a max of 4 a day of the tylenol arthritis (650mg ) as long as no other medications you are taking contain tylenol.   Mobic can cause inflammation in your stomach and can cause ulcers or bleeding, this will look like black tarry stools Make sure you take your mobic with food Try not to take it daily, take AS needed Can take with PEPCID  General eating tips  What to Avoid . Avoid added sugars o Often added sugar can be found in processed foods such as many condiments, dry cereals, cakes, cookies, chips, crisps, crackers, candies, sweetened drinks, etc.  o Read labels and AVOID/DECREASE use of foods with the following in their ingredient list: Sugar, fructose, high fructose corn syrup, sucrose, glucose, maltose, dextrose, molasses, cane sugar, brown sugar, any type of syrup, agave nectar, etc.   . Avoid snacking in between meals- drink water or if you feel you need a snack, pick a high water content snack such as cucumbers, watermelon, or any veggie.  Marland Kitchen Avoid foods made with  flour o If you are going to eat food made with flour, choose those made with whole-grains; and, minimize your consumption as much as is tolerable . Avoid processed foods o These foods are generally stocked in the middle of the grocery store.  o Focus on shopping on the perimeter of the grocery.  What to Include . Vegetables o GREEN LEAFY VEGETABLES: Kale, spinach, mustard greens, collard greens, cabbage, broccoli, etc. o OTHER: Asparagus, cauliflower, eggplant, carrots, peas, Brussel sprouts, tomatoes, bell peppers, zucchini, beets, cucumbers, etc. . Grains, seeds, and legumes o Beans: kidney beans, black eyed peas, garbanzo beans, black beans, pinto beans, etc. o Whole, unrefined grains: brown rice, barley, bulgur, oatmeal, etc. . Healthy fats  o Avoid highly processed fats such as vegetable oil o Examples of healthy fats: avocado, olives, virgin olive oil, dark chocolate (?72% Cocoa), nuts (peanuts, almonds, walnuts, cashews, pecans, etc.) o Please still do small amount of these healthy fats, they are dense in calories.  . Low - Moderate Intake of Animal Sources of Protein o Meat sources: chicken, [place], salmon, tuna. Limit to 4 ounces of meat at one time or the size of your palm. o Consider limiting dairy sources, but when choosing dairy focus on: PLAIN [place] yogurt, cottage cheese, high-protein milk . Fruit o Choose berries

## 2019-11-20 LAB — COMPLETE METABOLIC PANEL WITH GFR
AG Ratio: 1.2 (calc) (ref 1.0–2.5)
ALT: 15 U/L (ref 6–29)
AST: 16 U/L (ref 10–35)
Albumin: 4.1 g/dL (ref 3.6–5.1)
Alkaline phosphatase (APISO): 127 U/L (ref 37–153)
BUN: 14 mg/dL (ref 7–25)
CO2: 29 mmol/L (ref 20–32)
Calcium: 9.4 mg/dL (ref 8.6–10.4)
Chloride: 101 mmol/L (ref 98–110)
Creat: 0.8 mg/dL (ref 0.50–0.99)
GFR, Est African American: 90 mL/min/{1.73_m2} (ref >=60)
GFR, Est Non African American: 77 mL/min/{1.73_m2} (ref >=60)
Globulin: 3.5 g/dL (calc) (ref 1.9–3.7)
Glucose, Bld: 120 mg/dL — ABNORMAL HIGH (ref 65–99)
Potassium: 4.7 mmol/L (ref 3.5–5.3)
Sodium: 138 mmol/L (ref 135–146)
Total Bilirubin: 1 mg/dL (ref 0.2–1.2)
Total Protein: 7.6 g/dL (ref 6.1–8.1)

## 2019-11-20 LAB — URINALYSIS, ROUTINE W REFLEX MICROSCOPIC
Bilirubin Urine: NEGATIVE
Glucose, UA: NEGATIVE
Hgb urine dipstick: NEGATIVE
Hyaline Cast: NONE SEEN /LPF
Ketones, ur: NEGATIVE
Nitrite: NEGATIVE
Protein, ur: NEGATIVE
Specific Gravity, Urine: 1.014 (ref 1.001–1.03)
pH: 5.5 (ref 5.0–8.0)

## 2019-11-20 LAB — CBC WITH DIFFERENTIAL/PLATELET
Absolute Monocytes: 442 cells/uL (ref 200–950)
Basophils Absolute: 38 cells/uL (ref 0–200)
Basophils Relative: 0.6 %
Eosinophils Absolute: 102 cells/uL (ref 15–500)
Eosinophils Relative: 1.6 %
HCT: 38.9 % (ref 35.0–45.0)
Hemoglobin: 12.8 g/dL (ref 11.7–15.5)
Lymphs Abs: 2003 cells/uL (ref 850–3900)
MCH: 28.1 pg (ref 27.0–33.0)
MCHC: 32.9 g/dL (ref 32.0–36.0)
MCV: 85.5 fL (ref 80.0–100.0)
MPV: 10.5 fL (ref 7.5–12.5)
Monocytes Relative: 6.9 %
Neutro Abs: 3814 cells/uL (ref 1500–7800)
Neutrophils Relative %: 59.6 %
Platelets: 215 10*3/uL (ref 140–400)
RBC: 4.55 10*6/uL (ref 3.80–5.10)
RDW: 13.6 % (ref 11.0–15.0)
Total Lymphocyte: 31.3 %
WBC: 6.4 10*3/uL (ref 3.8–10.8)

## 2019-11-20 LAB — HEMOGLOBIN A1C
Hgb A1c MFr Bld: 5.5 % of total Hgb (ref <5.7)
Mean Plasma Glucose: 111 (calc)
eAG (mmol/L): 6.2 (calc)

## 2019-11-20 LAB — LIPID PANEL
Cholesterol: 173 mg/dL (ref <200)
HDL: 63 mg/dL (ref >=50)
LDL Cholesterol (Calc): 95 mg/dL (calc)
Non-HDL Cholesterol (Calc): 110 mg/dL (calc) (ref <130)
Total CHOL/HDL Ratio: 2.7 (calc) (ref <5.0)
Triglycerides: 68 mg/dL (ref <150)

## 2019-11-20 LAB — TSH: TSH: 3.38 mIU/L (ref 0.40–4.50)

## 2019-11-20 LAB — MICROALBUMIN / CREATININE URINE RATIO
Creatinine, Urine: 81 mg/dL (ref 20–275)
Microalb Creat Ratio: 15 mcg/mg creat (ref <30)
Microalb, Ur: 1.2 mg/dL

## 2019-11-20 LAB — VITAMIN D 25 HYDROXY (VIT D DEFICIENCY, FRACTURES): Vit D, 25-Hydroxy: 8 ng/mL — ABNORMAL LOW (ref 30–100)

## 2019-11-20 LAB — MAGNESIUM: Magnesium: 2 mg/dL (ref 1.5–2.5)

## 2020-02-18 ENCOUNTER — Other Ambulatory Visit: Payer: Self-pay | Admitting: Obstetrics and Gynecology

## 2020-02-18 DIAGNOSIS — Z1231 Encounter for screening mammogram for malignant neoplasm of breast: Secondary | ICD-10-CM

## 2020-02-21 ENCOUNTER — Ambulatory Visit: Payer: 59 | Admitting: Physician Assistant

## 2020-02-21 ENCOUNTER — Other Ambulatory Visit: Payer: Self-pay | Admitting: Internal Medicine

## 2020-02-27 LAB — HM COLONOSCOPY

## 2020-02-29 ENCOUNTER — Ambulatory Visit
Admission: RE | Admit: 2020-02-29 | Discharge: 2020-02-29 | Disposition: A | Discharge status: Home / Self Care | Payer: 59 | Source: Ambulatory Visit | Attending: Physician Assistant | Admitting: Physician Assistant

## 2020-02-29 ENCOUNTER — Other Ambulatory Visit: Payer: Self-pay

## 2020-02-29 DIAGNOSIS — E348 Other specified endocrine disorders: Secondary | ICD-10-CM

## 2020-03-07 ENCOUNTER — Ambulatory Visit
Admission: RE | Admit: 2020-03-07 | Discharge: 2020-03-07 | Disposition: A | Discharge status: Home / Self Care | Payer: 59 | Source: Ambulatory Visit | Attending: Obstetrics and Gynecology | Admitting: Obstetrics and Gynecology

## 2020-03-07 ENCOUNTER — Other Ambulatory Visit: Payer: Self-pay

## 2020-03-07 DIAGNOSIS — Z1231 Encounter for screening mammogram for malignant neoplasm of breast: Secondary | ICD-10-CM

## 2020-03-18 ENCOUNTER — Encounter: Payer: Self-pay | Admitting: Adult Health Nurse Practitioner

## 2020-03-18 ENCOUNTER — Ambulatory Visit (INDEPENDENT_AMBULATORY_CARE_PROVIDER_SITE_OTHER): Payer: 59 | Admitting: Adult Health Nurse Practitioner

## 2020-03-18 ENCOUNTER — Other Ambulatory Visit: Payer: Self-pay

## 2020-03-18 VITALS — BP 132/68 | Temp 97.7°F | Wt 346.0 lb

## 2020-03-18 DIAGNOSIS — D573 Sickle-cell trait: Secondary | ICD-10-CM

## 2020-03-18 DIAGNOSIS — E782 Mixed hyperlipidemia: Secondary | ICD-10-CM

## 2020-03-18 DIAGNOSIS — M25562 Pain in left knee: Secondary | ICD-10-CM

## 2020-03-18 DIAGNOSIS — R7309 Other abnormal glucose: Secondary | ICD-10-CM

## 2020-03-18 DIAGNOSIS — M25561 Pain in right knee: Secondary | ICD-10-CM

## 2020-03-18 DIAGNOSIS — M1000 Idiopathic gout, unspecified site: Secondary | ICD-10-CM

## 2020-03-18 DIAGNOSIS — R252 Cramp and spasm: Secondary | ICD-10-CM

## 2020-03-18 DIAGNOSIS — Z6841 Body Mass Index (BMI) 40.0 and over, adult: Secondary | ICD-10-CM

## 2020-03-18 DIAGNOSIS — E559 Vitamin D deficiency, unspecified: Secondary | ICD-10-CM

## 2020-03-18 DIAGNOSIS — I10 Essential (primary) hypertension: Secondary | ICD-10-CM | POA: Diagnosis not present

## 2020-03-18 DIAGNOSIS — Z79899 Other long term (current) drug therapy: Secondary | ICD-10-CM

## 2020-03-18 DIAGNOSIS — G8929 Other chronic pain: Secondary | ICD-10-CM

## 2020-03-18 NOTE — Progress Notes (Signed)
FOLLOW UP 3 MONTH  Assessment and Plan:   Alicia Diaz was seen today for follow-up.  Diagnoses and all orders for this visit:  Essential hypertension       Using Bumex 90m PRN Monitor blood pressure at home; call if consistently over 130/80 Continue DASH diet.   Reminder to go to the ER if any CP, SOB, nausea, dizziness, severe HA, changes vision/speech, left arm numbness and tingling and jaw pain. -     CBC with Differential/Platelet -     COMPLETE METABOLIC PANEL WITH GFR -     Hemoglobin A1c  Sickle cell trait (HCC) Monitor  Morbid obesity with BMI of 50.0-59.9, adult (HCC) Discussed dietary and exercise modifications -     CBC with Differential/Platelet -     COMPLETE METABOLIC PANEL WITH GFR -     Hemoglobin A1c  Abnormal glucose Discussed dietary and exercise modifications -     Hemoglobin A1c  Hyperlipidemia, mixed Not on medications at this time Discussed dietary and exercise modifications Low fat diet -     Lipid panel  Cramp and spasms -     Magnesium  Vitamin D deficiency Continue supplementation to maintain goal of 70-100 Not taking supplementation at this time Defer vitamin D level   Idiopathic gout, unspecified chronicity, unspecified site Continue allopurinol 3025mdaily No recent flares Discussed dietary modifications Continue to monitor  Chronic pain of both knees Had Ortho consult, weight loss advised Discussed with patient  Medication management Continued    Continue diet and meds as discussed. Further disposition pending results of labs. Discussed med's effects and SE's.   Over 30 minutes of face to face interview, exam, counseling, chart review, and critical decision making was performed.   Future Appointments  Date Time Provider [place]2/21/2022 11:00 AM Alicia SierrasNP [facility] None  11/19/2020 11:00 AM Alicia PintoMD [facility] None     ----------------------------------------------------------------------------------------------------------------------  HPI [age].o. female  presents for 3 month follow up on hypertension, cholesterol, history of prediabetes, morbid obesity (BMI 50+) and vitamin D deficiency.   She has bilateral knee OA, last saw Dr. [name] 2015, states her right knee has been bothering her more. She has to use a cane occ because she feels she will fall, she has pain.   She recently had a colonoscopy and was told she has ulcers, will look for report.  She wanted to start taking meloxicam again for her knees but reports it was not recommended.  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   BMI is Body mass index is 54.19 kg/m., she has not been working on diet and exercise. Admits she will commonly not eat all day then eat one very large meal at the end of the day.  She is in agreement she needs to make changes. Not currently checking weights, needs new scale.   Down 12 lbs since last OV. Wt Readings from Last 3 Encounters:  03/18/20 (!) 346 lb (156.9 kg)  11/19/19 (!) 354 lb (160.6 kg)  08/16/19 (!) 356 lb (161.5 kg)   She reports she has not checked her BP recently but bought new BP cuff, today their BP is BP: 132/68 by provider manual recheck  She does not workout. She denies chest pain, shortness of breath, dizziness.    She is not on cholesterol medication and denies myalgias. Her  cholesterol is at goal. The cholesterol last visit was:   Lab Results  Component Value Date   CHOL 189 03/18/2020   HDL 67 03/18/2020   LDLCALC 107 (H) 03/18/2020   TRIG 65 03/18/2020   CHOLHDL 2.8 03/18/2020    She has not been working on diet and exercise for history of prediabetes, and denies increased appetite, nausea, paresthesia of the feet, polydipsia, polyuria, visual disturbances, vomiting and weight loss. Last  A1C in the office was:  Lab Results  Component Value Date   HGBA1C 5.6 03/18/2020   Patient is not regular on Vitamin D supplement and remains very low at last check:   Lab Results  Component Value Date   VD25OH 8 (L) 11/19/2019     Patient is on allopurinol for gout and does not report a recent flare.  Lab Results  Component Value Date   LABURIC 6.0 04/02/2019      Current Medications:    Current Outpatient Medications (Cardiovascular):  .  bumetanide (BUMEX) 1 MG tablet, Take 1 tablet (1 mg total) by mouth as needed. .  doxazosin (CARDURA) 8 MG tablet, Take 1 tablet at bedtime for BP   Current Outpatient Medications (Analgesics):  .  allopurinol (ZYLOPRIM) 300 MG tablet, TAKE 1 TABLET DAILY TO PREVENT GOUT .  aspirin 81 MG tablet, Take 81 mg by mouth daily.   Current Outpatient Medications (Other):  .  gabapentin (NEURONTIN) 600 MG tablet, Take 1/2 to 1 tablet 3 x /day for Chronic Pain   Allergies:  Allergies  Allergen Reactions  . Ace Inhibitors     Cough  . Losartan   . Paxil [Paroxetine Hcl]     Mood swings  . Prednisone     Nausea/ irratated  . Vitamin D Analogs     Cramping     Medical History:  Past Medical History:  Diagnosis Date  . Anemia   . DDD (degenerative disc disease)   . DJD (degenerative joint disease)   . Elevated hemoglobin A1c   . GERD (gastroesophageal reflux disease)   . Hyperlipidemia   . Hypertension   . Obesity   . Sickle cell trait ([place])    Immunization History  Administered Date(s) Administered  . PFIZER SARS-COV-2 Vaccination 08/22/2019, 09/12/2019  . PPD Test 05/02/2013, 06/18/2014, 07/27/2016, 08/22/2017, 09/12/2018  . Td 07/01/2015   Health Maintenance  Topic Date Due  . PNA vac Low Risk Adult (1 of 2 - PCV13) Never done  . INFLUENZA VACCINE  Never done  . MAMMOGRAM  03/07/2021  . PAP SMEAR-Modifier  01/09/2022  . TETANUS/TDAP  06/30/2025  . COLONOSCOPY  02/26/2030  . DEXA SCAN  Completed  . COVID-19 Vaccine   Completed  . Hepatitis C Screening  Completed  . HIV Screening  Completed   PAP: DR. Ulanda Edison 2020 DEXA 02/2020 MGM 02/2020 Colonoscopy 02/2020 PREVNAR: Discussed with patient, DUE  SURGICAL HISTORY She  has a past surgical history that includes Shoulder surgery (Left, 2007). FAMILY HISTORY Her family history includes Cancer in her father; Diabetes in her brother and sister; Heart disease in her mother; Mental illness in her sister; Multiple myeloma in her father; Obstructive Sleep Apnea in her brother. SOCIAL HISTORY She  reports that she has never smoked. She has never used smokeless tobacco. She reports current alcohol use of about 6.0 standard drinks of alcohol per week.   Review of Systems:  Review of Systems  Constitutional: Negative for malaise/fatigue and weight loss.  HENT: Negative for hearing loss  and tinnitus.   Eyes: Negative for blurred vision and double vision.  Respiratory: Negative for cough, shortness of breath and wheezing.   Cardiovascular: Negative for chest pain, palpitations, orthopnea, claudication and leg swelling.  Gastrointestinal: Negative for abdominal pain, blood in stool, constipation, diarrhea, heartburn, melena, nausea and vomiting.  Genitourinary: Negative.   Musculoskeletal: Negative for joint pain and myalgias.  Skin: Negative for rash.  Neurological: Negative for dizziness, tingling, sensory change, weakness and headaches.  Endo/Heme/Allergies: Negative for polydipsia.  Psychiatric/Behavioral: Negative.  Negative for depression. The patient is not nervous/anxious and does not have insomnia.   All other systems reviewed and are negative.   Physical Exam: BP 132/68   Temp 97.7 F (36.5 C)   Wt (!) 346 lb (156.9 kg)   BMI 54.19 kg/m  Wt Readings from Last 3 Encounters:  03/18/20 (!) 346 lb (156.9 kg)  11/19/19 (!) 354 lb (160.6 kg)  08/16/19 (!) 356 lb (161.5 kg)   General Appearance: Well nourished, morbidly obese, in no apparent  distress. Eyes: PERRLA, EOMs, conjunctiva no swelling or erythema Sinuses: No Frontal/maxillary tenderness ENT/Mouth: Ext aud canals clear, TMs without erythema, bulging. No erythema, swelling, or exudate on post pharynx.  Tonsils not swollen or erythematous. Hearing normal.  Neck: Supple, thyroid normal.  Respiratory: Respiratory effort normal, BS equal bilaterally without rales, rhonchi, wheezing or stridor.  Cardio: Distant heart sounds due to body habitus; RRR with no audible MRGs. Brisk peripheral pulses with some non-pitting edema to bilateral ankles.  Abdomen: Soft, obese abdomen, + BS.  Non tender, no guarding, rebound, hernias, masses. Lymphatics: Non tender without lymphadenopathy.  Musculoskeletal: Full ROM, 5/5 strength, Normal gait Skin: Warm, dry without rashes, lesions, ecchymosis.  Neuro: Cranial nerves intact. No cerebellar symptoms.  Psych: Awake and oriented X 3, normal affect, Insight and Judgment appropriate.    Garnet Sierras, NP 3:01 PM [facility]

## 2020-03-18 NOTE — Patient Instructions (Signed)
   Vitamin D gummies  5,000IU  Take two of them daily.   Magnesium take this every evening to help prevent cramping at bedtime.   Consider Oren Beckmann injections for weight loss.  It is a once a week injection.   Consider compression stockings to help with swelling in our legs when you are working.

## 2020-03-19 LAB — COMPLETE METABOLIC PANEL WITH GFR
AG Ratio: 1.1 (calc) (ref 1.0–2.5)
ALT: 16 U/L (ref 6–29)
AST: 20 U/L (ref 10–35)
Albumin: 3.9 g/dL (ref 3.6–5.1)
Alkaline phosphatase (APISO): 117 U/L (ref 37–153)
BUN: 12 mg/dL (ref 7–25)
CO2: 29 mmol/L (ref 20–32)
Calcium: 9.2 mg/dL (ref 8.6–10.4)
Chloride: 100 mmol/L (ref 98–110)
Creat: 0.78 mg/dL (ref 0.50–0.99)
GFR, Est African American: 92 mL/min/{1.73_m2} (ref >=60)
GFR, Est Non African American: 80 mL/min/{1.73_m2} (ref >=60)
Globulin: 3.7 g/dL (calc) (ref 1.9–3.7)
Glucose, Bld: 113 mg/dL — ABNORMAL HIGH (ref 65–99)
Potassium: 4.7 mmol/L (ref 3.5–5.3)
Sodium: 138 mmol/L (ref 135–146)
Total Bilirubin: 1 mg/dL (ref 0.2–1.2)
Total Protein: 7.6 g/dL (ref 6.1–8.1)

## 2020-03-19 LAB — CBC WITH DIFFERENTIAL/PLATELET
Absolute Monocytes: 496 cells/uL (ref 200–950)
Basophils Absolute: 30 cells/uL (ref 0–200)
Basophils Relative: 0.5 %
Eosinophils Absolute: 118 cells/uL (ref 15–500)
Eosinophils Relative: 2 %
HCT: 37.4 % (ref 35.0–45.0)
Hemoglobin: 12.7 g/dL (ref 11.7–15.5)
Lymphs Abs: 1794 cells/uL (ref 850–3900)
MCH: 29.4 pg (ref 27.0–33.0)
MCHC: 34 g/dL (ref 32.0–36.0)
MCV: 86.6 fL (ref 80.0–100.0)
MPV: 9.9 fL (ref 7.5–12.5)
Monocytes Relative: 8.4 %
Neutro Abs: 3463 cells/uL (ref 1500–7800)
Neutrophils Relative %: 58.7 %
Platelets: 216 10*3/uL (ref 140–400)
RBC: 4.32 10*6/uL (ref 3.80–5.10)
RDW: 12.9 % (ref 11.0–15.0)
Total Lymphocyte: 30.4 %
WBC: 5.9 10*3/uL (ref 3.8–10.8)

## 2020-03-19 LAB — LIPID PANEL
Cholesterol: 189 mg/dL (ref <200)
HDL: 67 mg/dL (ref >=50)
LDL Cholesterol (Calc): 107 mg/dL (calc) — ABNORMAL HIGH
Non-HDL Cholesterol (Calc): 122 mg/dL (calc) (ref <130)
Total CHOL/HDL Ratio: 2.8 (calc) (ref <5.0)
Triglycerides: 65 mg/dL (ref <150)

## 2020-03-19 LAB — HEMOGLOBIN A1C
Hgb A1c MFr Bld: 5.6 % of total Hgb (ref <5.7)
Mean Plasma Glucose: 114 (calc)
eAG (mmol/L): 6.3 (calc)

## 2020-03-19 LAB — MAGNESIUM: Magnesium: 2.1 mg/dL (ref 1.5–2.5)

## 2020-03-25 ENCOUNTER — Encounter: Payer: Self-pay | Admitting: Internal Medicine

## 2020-05-26 ENCOUNTER — Other Ambulatory Visit: Payer: Self-pay | Admitting: Internal Medicine

## 2020-05-26 DIAGNOSIS — I10 Essential (primary) hypertension: Secondary | ICD-10-CM

## 2020-06-18 ENCOUNTER — Other Ambulatory Visit: Payer: Self-pay | Admitting: Adult Health Nurse Practitioner

## 2020-07-14 ENCOUNTER — Ambulatory Visit: Payer: 59 | Admitting: Adult Health Nurse Practitioner

## 2020-07-15 ENCOUNTER — Other Ambulatory Visit: Payer: Self-pay

## 2020-07-15 ENCOUNTER — Ambulatory Visit (INDEPENDENT_AMBULATORY_CARE_PROVIDER_SITE_OTHER): Payer: 59 | Admitting: Podiatry

## 2020-07-15 ENCOUNTER — Encounter: Payer: Self-pay | Admitting: Podiatry

## 2020-07-15 DIAGNOSIS — M79676 Pain in unspecified toe(s): Secondary | ICD-10-CM

## 2020-07-15 DIAGNOSIS — Q828 Other specified congenital malformations of skin: Secondary | ICD-10-CM

## 2020-07-15 DIAGNOSIS — B351 Tinea unguium: Secondary | ICD-10-CM

## 2020-07-15 NOTE — Progress Notes (Signed)
She presents today chief complaint of painfully elongated toenails.  Objective: Vital signs stable exam x3 pulses are palpable.  Toenails are long thick yellow dystrophic Lee mycotic painful palpation.  Assessment: Pain in limb secondary onychomycosis and reactive hyperkeratosis.  Plan: Debrided benign skin lesions bilaterally debrided nails 1 through 5 bilateral.

## 2020-07-17 ENCOUNTER — Ambulatory Visit (INDEPENDENT_AMBULATORY_CARE_PROVIDER_SITE_OTHER): Payer: 59 | Admitting: Adult Health Nurse Practitioner

## 2020-07-17 ENCOUNTER — Encounter: Payer: Self-pay | Admitting: Adult Health Nurse Practitioner

## 2020-07-17 ENCOUNTER — Other Ambulatory Visit: Payer: Self-pay

## 2020-07-17 VITALS — HR 107 | Temp 97.2°F | Ht 67.0 in | Wt 349.0 lb

## 2020-07-17 DIAGNOSIS — G8929 Other chronic pain: Secondary | ICD-10-CM

## 2020-07-17 DIAGNOSIS — E559 Vitamin D deficiency, unspecified: Secondary | ICD-10-CM

## 2020-07-17 DIAGNOSIS — I10 Essential (primary) hypertension: Secondary | ICD-10-CM | POA: Diagnosis not present

## 2020-07-17 DIAGNOSIS — E782 Mixed hyperlipidemia: Secondary | ICD-10-CM

## 2020-07-17 DIAGNOSIS — Z79899 Other long term (current) drug therapy: Secondary | ICD-10-CM

## 2020-07-17 DIAGNOSIS — D573 Sickle-cell trait: Secondary | ICD-10-CM

## 2020-07-17 DIAGNOSIS — R7309 Other abnormal glucose: Secondary | ICD-10-CM

## 2020-07-17 DIAGNOSIS — M25562 Pain in left knee: Secondary | ICD-10-CM

## 2020-07-17 DIAGNOSIS — M25561 Pain in right knee: Secondary | ICD-10-CM

## 2020-07-17 NOTE — Progress Notes (Signed)
FOLLOW UP 3 MONTH  Assessment and Plan:   Alicia Diaz was seen today for follow-up.  Diagnoses and all orders for this visit:  Essential hypertension       Using Bumex 60m PRN Monitor blood pressure at home; call if consistently over 130/80 Continue DASH diet.   Reminder to go to the ER if any CP, SOB, nausea, dizziness, severe HA, changes vision/speech, left arm numbness and tingling and jaw pain. -     CBC with Differential/Platelet -     COMPLETE METABOLIC PANEL WITH GFR -     Hemoglobin A1c  Sickle cell trait (HCC) Monitor  Morbid obesity with BMI of 50.0-59.9, adult (HCC) Discussed dietary and exercise modifications Discussed further weight loss and management to improve health, Wegovy? -     CBC with Differential/Platelet -     COMPLETE METABOLIC PANEL WITH GFR -     Hemoglobin A1c  Abnormal glucose Discussed dietary and exercise modifications -     Hemoglobin A1c  Hyperlipidemia, mixed Not on medications at this time Discussed dietary and exercise modifications Low fat diet -     Lipid panel   Vitamin D deficiency Continue supplementation to maintain goal of 70-100 Not taking supplementation at this time Defer vitamin D level   Idiopathic gout, unspecified chronicity, unspecified site Continue allopurinol 3029mdaily No recent flares Discussed dietary modifications Continue to monitor  Chronic pain of both knees Had Ortho consult, weight loss advised Discussed with patient  Medication management Continued    Continue diet and meds as discussed. Further disposition pending results of labs. Discussed med's effects and SE's.   Over 30 minutes of face to face interview, exam, counseling, chart review, and critical decision making was performed.   Future Appointments  Date Time Provider De[place]6/23/2022  1:15 PM [facility]  11/19/2020 11:00 AM McUnk PintoMD [facility] None     ----------------------------------------------------------------------------------------------------------------------  HPI [age].o. female  presents for 3 month follow up on HTN, HLD, history of prediabetes, morbid obesity (BMI 50+) and vitamin D deficiency.   She reports overall she is doing well, she does not have any health or medication concerns today.  She has bilateral knee OA, last saw Dr. RoMayer Alicia Diaz 2015, states her right knee has been bothering her more. She has to use a cane occ because she feels she will fall, she has pain.   She recently had a colonoscopy and was told she has ulcers, will look for report.  She wanted to start taking meloxicam again for her knees but reports it was not recommended.  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   BMI is Body mass index is 54.66 kg/m., she has not been working on diet and exercise. Admits she will commonly not eat all day then eat one very large meal at the end of the day.  She is in agreement she needs to make changes. Not currently checking weights, needs new scale.   Down 12 lbs since 10/2019.  Discussed further weight loss management with patient.  [place]?  Wt Readings from Last 3 Encounters:  07/17/20 (!) 349 lb (158.3 kg)  03/18/20 (!) 346 lb (156.9 kg)  11/19/19 (!) 354 lb (160.6 kg)   She reports she has not checked her BP recently but bought new BP cuff, today their BP is  by provider manual recheck  135/90.  Discussed monitoring this at home and goal for patient.   She does not workout. She denies chest pain, shortness of breath, dizziness.    She is not on cholesterol medication and denies myalgias. Her cholesterol is at goal. The cholesterol last visit was:   Lab Results  Component Value Date   CHOL 189 03/18/2020   HDL 67 03/18/2020   LDLCALC 107 (H) 03/18/2020   TRIG 65 03/18/2020   CHOLHDL 2.8 03/18/2020    She  has not been working on diet and exercise for history of prediabetes, and denies increased appetite, nausea, paresthesia of the feet, polydipsia, polyuria, visual disturbances, vomiting and weight loss. Last A1C in the office was:  Lab Results  Component Value Date   HGBA1C 5.6 03/18/2020   Patient is not regular on Vitamin D supplement and remains very low at last check:   Lab Results  Component Value Date   VD25OH 8 (L) 11/19/2019     Patient is on allopurinol for gout and does not report a recent flare.  Lab Results  Component Value Date   LABURIC 6.0 04/02/2019      Current Medications:    Current Outpatient Medications (Cardiovascular):  .  bumetanide (BUMEX) 1 MG tablet, Take 1 tablet (1 mg total) by mouth as needed. .  doxazosin (CARDURA) 8 MG tablet, Take     1 tablet     at Bedtime       for BP   Current Outpatient Medications (Analgesics):  .  allopurinol (ZYLOPRIM) 300 MG tablet, TAKE 1 TABLET DAILY TO PREVENT GOUT .  aspirin 81 MG tablet, Take 81 mg by mouth daily.   Current Outpatient Medications (Other):  .  gabapentin (NEURONTIN) 600 MG tablet, Take      1/2 to 1 tablet        3 x /day       for Chronic Pain   Allergies:  Allergies  Allergen Reactions  . Ace Inhibitors     Cough  . Losartan   . Paxil [Paroxetine Hcl]     Mood swings  . Prednisone     Nausea/ irratated  . Vitamin D Analogs     Cramping     Medical History:  Past Medical History:  Diagnosis Date  . Anemia   . DDD (degenerative disc disease)   . DJD (degenerative joint disease)   . Elevated hemoglobin A1c   . GERD (gastroesophageal reflux disease)   . Hyperlipidemia   . Hypertension   . Obesity   . Sickle cell trait ([place])    Immunization History  Administered Date(s) Administered  . PFIZER(Purple Top)SARS-COV-2 Vaccination 08/22/2019, 09/12/2019, 06/05/2020  . PPD Test 05/02/2013, 06/18/2014, 07/27/2016, 08/22/2017, 09/12/2018  . Td 07/01/2015   Health Maintenance   Topic Date Due  . PNA vac Low Risk Adult (1 of 2 - PCV13) Never done  . INFLUENZA VACCINE  Never done  . MAMMOGRAM  03/07/2021  . TETANUS/TDAP  06/30/2025  . COLONOSCOPY (Pts 45-46yr Insurance coverage will need to be confirmed)  02/26/2030  . DEXA SCAN  Completed  . COVID-19 Vaccine  Completed  . Hepatitis C Screening  Completed   PAP: DR. HUlanda Edison2020 DEXA 02/2020 MGM 02/2020 Colonoscopy 02/2020 PREVNAR: Discussed with patient, DUE  SURGICAL HISTORY She  has a past surgical history that includes Shoulder surgery (Left, 2007). FAMILY HISTORY Her family history includes Cancer in her father; Diabetes in her brother and sister; Heart  disease in her mother; Mental illness in her sister; Multiple myeloma in her father; Obstructive Sleep Apnea in her brother. SOCIAL HISTORY She  reports that she has never smoked. She has never used smokeless tobacco. She reports current alcohol use of about 6.0 standard drinks of alcohol per week.   Review of Systems:  Review of Systems  Constitutional: Negative for malaise/fatigue and weight loss.  HENT: Negative for hearing loss and tinnitus.   Eyes: Negative for blurred vision and double vision.  Respiratory: Negative for cough, shortness of breath and wheezing.   Cardiovascular: Negative for chest pain, palpitations, orthopnea, claudication and leg swelling.  Gastrointestinal: Negative for abdominal pain, blood in stool, constipation, diarrhea, heartburn, melena, nausea and vomiting.  Genitourinary: Negative.   Musculoskeletal: Negative for joint pain and myalgias.  Skin: Negative for rash.  Neurological: Negative for dizziness, tingling, sensory change, weakness and headaches.  Endo/Heme/Allergies: Negative for polydipsia.  Psychiatric/Behavioral: Negative.  Negative for depression. The patient is not nervous/anxious and does not have insomnia.   All other systems reviewed and are negative.   Physical Exam: Pulse (!) 107   Temp (!) 97.2  F (36.2 C)   Ht '5\' 7"'  (1.702 m)   Wt (!) 349 lb (158.3 kg)   SpO2 96%   BMI 54.66 kg/m  Wt Readings from Last 3 Encounters:  07/17/20 (!) 349 lb (158.3 kg)  03/18/20 (!) 346 lb (156.9 kg)  11/19/19 (!) 354 lb (160.6 kg)   General Appearance: Well nourished, morbidly obese, in no apparent distress. Eyes: PERRLA, EOMs, conjunctiva no swelling or erythema Sinuses: No Frontal/maxillary tenderness ENT/Mouth: Ext aud canals clear, TMs without erythema, bulging. No erythema, swelling, or exudate on post pharynx.  Tonsils not swollen or erythematous. Hearing normal.  Neck: Supple, thyroid normal.  Respiratory: Respiratory effort normal, BS equal bilaterally without rales, rhonchi, wheezing or stridor.  Cardio: Distant heart sounds due to body habitus; RRR with no audible MRGs. Brisk peripheral pulses with some non-pitting edema to bilateral ankles.  Abdomen: Soft, obese abdomen, + BS.  Non tender, no guarding, rebound, hernias, masses. Lymphatics: Non tender without lymphadenopathy.  Musculoskeletal: Full ROM, 5/5 strength, Normal gait Skin: Warm, dry without rashes, lesions, ecchymosis.  Neuro: Cranial nerves intact. No cerebellar symptoms.  Psych: Awake and oriented X 3, normal affect, Insight and Judgment appropriate.    Garnet Sierras, NP 12:11 PM [facility]

## 2020-07-18 LAB — HEMOGLOBIN A1C
Hgb A1c MFr Bld: 5.7 % of total Hgb — ABNORMAL HIGH (ref <5.7)
Mean Plasma Glucose: 117 mg/dL
eAG (mmol/L): 6.5 mmol/L

## 2020-07-18 LAB — LIPID PANEL
Cholesterol: 188 mg/dL (ref <200)
HDL: 62 mg/dL (ref >=50)
LDL Cholesterol (Calc): 108 mg/dL (calc) — ABNORMAL HIGH
Non-HDL Cholesterol (Calc): 126 mg/dL (calc) (ref <130)
Total CHOL/HDL Ratio: 3 (calc) (ref <5.0)
Triglycerides: 85 mg/dL (ref <150)

## 2020-07-18 LAB — CBC WITH DIFFERENTIAL/PLATELET
Absolute Monocytes: 477 cells/uL (ref 200–950)
Basophils Absolute: 37 cells/uL (ref 0–200)
Basophils Relative: 0.6 %
Eosinophils Absolute: 186 cells/uL (ref 15–500)
Eosinophils Relative: 3 %
HCT: 36.4 % (ref 35.0–45.0)
Hemoglobin: 12 g/dL (ref 11.7–15.5)
Lymphs Abs: 1798 cells/uL (ref 850–3900)
MCH: 28.4 pg (ref 27.0–33.0)
MCHC: 33 g/dL (ref 32.0–36.0)
MCV: 86.3 fL (ref 80.0–100.0)
MPV: 10.3 fL (ref 7.5–12.5)
Monocytes Relative: 7.7 %
Neutro Abs: 3701 cells/uL (ref 1500–7800)
Neutrophils Relative %: 59.7 %
Platelets: 220 10*3/uL (ref 140–400)
RBC: 4.22 10*6/uL (ref 3.80–5.10)
RDW: 12.9 % (ref 11.0–15.0)
Total Lymphocyte: 29 %
WBC: 6.2 10*3/uL (ref 3.8–10.8)

## 2020-07-18 LAB — COMPLETE METABOLIC PANEL WITH GFR
AG Ratio: 1.3 (calc) (ref 1.0–2.5)
ALT: 15 U/L (ref 6–29)
AST: 14 U/L (ref 10–35)
Albumin: 4 g/dL (ref 3.6–5.1)
Alkaline phosphatase (APISO): 115 U/L (ref 37–153)
BUN: 17 mg/dL (ref 7–25)
CO2: 28 mmol/L (ref 20–32)
Calcium: 9.1 mg/dL (ref 8.6–10.4)
Chloride: 103 mmol/L (ref 98–110)
Creat: 0.79 mg/dL (ref 0.50–0.99)
GFR, Est African American: 90 mL/min/{1.73_m2} (ref >=60)
GFR, Est Non African American: 78 mL/min/{1.73_m2} (ref >=60)
Globulin: 3.2 g/dL (calc) (ref 1.9–3.7)
Glucose, Bld: 124 mg/dL — ABNORMAL HIGH (ref 65–99)
Potassium: 4.3 mmol/L (ref 3.5–5.3)
Sodium: 138 mmol/L (ref 135–146)
Total Bilirubin: 1 mg/dL (ref 0.2–1.2)
Total Protein: 7.2 g/dL (ref 6.1–8.1)

## 2020-07-20 ENCOUNTER — Other Ambulatory Visit: Payer: Self-pay | Admitting: Internal Medicine

## 2020-07-20 DIAGNOSIS — I10 Essential (primary) hypertension: Secondary | ICD-10-CM

## 2020-10-04 ENCOUNTER — Other Ambulatory Visit: Payer: Self-pay | Admitting: Internal Medicine

## 2020-11-13 ENCOUNTER — Ambulatory Visit: Payer: 59 | Admitting: Podiatry

## 2020-11-19 ENCOUNTER — Encounter: Payer: Self-pay | Admitting: Internal Medicine

## 2020-11-19 ENCOUNTER — Encounter: Payer: 59 | Admitting: Adult Health Nurse Practitioner

## 2020-11-19 ENCOUNTER — Ambulatory Visit: Payer: 59 | Admitting: Internal Medicine

## 2020-11-19 NOTE — Progress Notes (Signed)
N  O       S  H  O  W  O  V  E  R  S  L  E  P  T                                                                                                                                                                                                                                                                                      This very nice 65 y.o.female presents for a Screening /Preventative Visit & comprehensive evaluation and management of multiple medical co-morbidities.  Patient has been followed for HTN, HLD, T2_NIDDM  Prediabetes  and Vitamin D Deficiency.        HTN predates since 68.  Patient's BP has been controlled at home and patient denies any cardiac symptoms as chest pain, palpitations, shortness of breath, dizziness or ankle swelling. Today's          Patient's hyperlipidemia is controlled with diet and medications. Patient denies myalgias or other medication SE's. Last lipids were   Lab Results  Component Value Date   CHOL 188 07/17/2020   HDL 62 07/17/2020   LDLCALC 108 (H) 07/17/2020   TRIG 85 07/17/2020   CHOLHDL 3.0 07/17/2020         Patient has hx/o prediabetes(A1c 5.9% /2011) and patient denies reactive hypoglycemic symptoms, visual blurring, diabetic polys or paresthesias. Last A1c was   Lab Results  Component Value Date   HGBA1C 5.7 (H) 07/17/2020         Finally, patient has history of Vitamin D Deficiency ("18" /2008) and last Vitamin D was   Lab Results  Component Value Date   VD25OH 8 (L) 11/19/2019     Current Outpatient Medications on File Prior to Visit  Medication Sig   allopurinol (ZYLOPRIM) 300 MG tablet TAKE 1 TABLET DAILY TO PREVENT GOUT   aspirin 81 MG tablet Take 81  mg by mouth daily.   bumetanide (BUMEX) 1 MG tablet Take 1 tablet (1 mg total) by mouth as needed.   doxazosin (CARDURA) 8 MG tablet Take  1 tablet  at Bedtime  for BP   gabapentin (NEURONTIN) 600 MG tablet Take 1/2 to 1 tablet 3 x /day as needed for Chronic Pain   No current facility-administered medications on file prior to visit.     Allergies  Allergen Reactions   Ace Inhibitors     Cough   Losartan    Paxil [Paroxetine Hcl]     Mood swings   Prednisone     Nausea/ irratated   Vitamin D Analogs     Cramping     Past Medical History:  Diagnosis Date   Anemia    DDD (degenerative disc disease)    DJD (degenerative joint disease)    Elevated hemoglobin A1c    GERD (gastroesophageal reflux disease)    Hyperlipidemia    Hypertension    Obesity    Sickle cell trait [facility])      Health Maintenance  Topic Date Due   Zoster Vaccines- Shingrix (1 of 2) Never done   PNA vac Low Risk Adult (1 of 2 - PCV13) Never done   COVID-19 Vaccine (4 - Booster for Pfizer series) 09/03/2020   INFLUENZA VACCINE  12/22/2020   MAMMOGRAM  03/07/2021   TETANUS/TDAP  06/30/2025   COLONOSCOPY (Pts 45-76yrs Insurance coverage will need to be confirmed)  02/26/2030   DEXA SCAN  Completed   Hepatitis C Screening  Completed   HPV VACCINES  Aged Out     Immunization History  Administered Date(s) Administered   PFIZER(Purple Top)SARS-COV-2 Vaccination 08/22/2019, 09/12/2019, 06/05/2020   PPD Test 05/02/2013, 06/18/2014, 07/27/2016, 08/22/2017, 09/12/2018   Td 07/01/2015   Last Colon - 02/27/2020 - Dr Warnell Bureau - Recc 5 yr f/u  Last MGM - 10.15/2021

## 2020-11-23 ENCOUNTER — Encounter: Payer: Self-pay | Admitting: Internal Medicine

## 2020-12-02 NOTE — Progress Notes (Signed)
COMPLETE PHYSICAL  Assessment and Plan:   Encounter for general adult medical examination with abnormal findings Needs Prevnar- declines   Sickle cell trait ([place]) Monitor  Morbid obesity ([place]) - follow up 3 months for progress monitoring - increase veggies, decrease carbs - long discussion about weight loss, diet, and exercise  Anemia due to blood loss Monitor  Abnormal glucose -     Hemoglobin A1c Discussed disease progression and risks Discussed diet/exercise, weight management and risk modification  Hyperlipidemia, mixed -     Lipid panel check lipids decrease fatty foods increase activity.   Essential hypertension - continue medications, DASH diet, exercise and encourage monitoring at home. Call if greater than 130/80.  -     CBC with Differential/Platelet -     COMPLETE METABOLIC PANEL WITH GFR -     TSH -     Urinalysis, Routine w reflex microscopic -     Microalbumin / creatinine urine ratio -     EKG 12-Lead  Vitamin D deficiency -     VITAMIN D 25 Hydroxy (Vit-D Deficiency, Fractures)  Idiopathic gout, unspecified chronicity, unspecified site Continue on allopurinol, no recent flares   Abnormal cervical Papanicolaou smear, unspecified abnormal pap finding Follows with GYN -2020 last one , needs to schedule  Chronic pain of both knees -     Has seen Othopedic but in arrears, had been recommended knee replacement - Continue to use icy hot with lidocaine and Tylenol  Medication management -     Magnesium      Continue diet and meds as discussed. Further disposition pending results of labs. Discussed med's effects and SE's.   Over 30 minutes of exam, counseling, chart review, and critical decision making was performed.   Future Appointments  Date Time Provider [place]  01/08/2021 10:45 AM [place], Bennie Pierini T, [place] [facility]  12/03/2021  2:00 PM Magda Bernheim, NP [facility] None     ----------------------------------------------------------------------------------------------------------------------  HPI 65 y.o. female  presents for complete physical on hypertension, cholesterol, history of prediabetes, morbid obesity (BMI 50+) and vitamin D deficiency.   She has bilateral knee OA, takes tylenol as needed for pain but is not providing relief.  She has been advised she will need a knee replacement but is currently in arrears at Orthopedic office so has not gone back for further treatment .  BMI is Body mass index is 52.94 kg/m., she has not been working on diet and exercise. . Not currently checking weights, needs new scale. She has lost 11 pounds in the past 5 months but has not made any changes.  Does agree diet and exercise regimen needs to change Wt Readings from Last 3 Encounters:  12/03/20 (!) 338 lb (153.3 kg)  07/17/20 (!) 349 lb (158.3 kg)  03/18/20 (!) 346 lb (156.9 kg)   She reports she has not checked her BP recently but bought new BP cuff, today their BP is BP: (!) 150/82 by provider manual recheck  She does not workout. She denies chest pain, shortness of breath, dizziness.   She is not on cholesterol medication and denies myalgias. Her cholesterol is at goal. The cholesterol last visit was:   Lab Results  Component Value Date   CHOL 188 07/17/2020   HDL 62 07/17/2020   LDLCALC 108 (H) 07/17/2020   TRIG 85 07/17/2020   CHOLHDL 3.0 07/17/2020    She has not been working on diet and exercise for history of prediabetes, and denies increased appetite, nausea,  paresthesia of the feet, polydipsia, polyuria, visual disturbances, vomiting and weight loss. Last A1C in the office was:  Lab Results  Component Value Date   HGBA1C 5.7 (H) 07/17/2020   Patient is not regular on Vitamin D supplement and remains very low at last check:   Lab Results  Component Value Date   VD25OH 8 (L) 11/19/2019     Patient is on allopurinol for gout and does not report a  recent flare.  Lab Results  Component Value Date   LABURIC 6.0 04/02/2019      Current Medications:    Current Outpatient Medications (Cardiovascular):    doxazosin (CARDURA) 8 MG tablet, Take  1 tablet  at Bedtime  for BP   bumetanide (BUMEX) 1 MG tablet, Take 1 tablet (1 mg total) by mouth as needed.   Current Outpatient Medications (Analgesics):    acetaminophen (TYLENOL) 500 MG tablet, Take 500 mg by mouth every 6 (six) hours as needed.   allopurinol (ZYLOPRIM) 300 MG tablet, TAKE 1 TABLET DAILY TO PREVENT GOUT   aspirin 81 MG tablet, Take 81 mg by mouth daily.   Current Outpatient Medications (Other):    gabapentin (NEURONTIN) 600 MG tablet, Take 1/2 to 1 tablet 3 x /day as needed for Chronic Pain   Allergies:  Allergies  Allergen Reactions   Ace Inhibitors     Cough   Losartan    Paxil [Paroxetine Hcl]     Mood swings   Prednisone     Nausea/ irratated   Vitamin D Analogs     Cramping     Medical History:  Past Medical History:  Diagnosis Date   Anemia    DDD (degenerative disc disease)    DJD (degenerative joint disease)    Elevated hemoglobin A1c    GERD (gastroesophageal reflux disease)    Hyperlipidemia    Hypertension    Obesity    Sickle cell trait ([place])    Immunization History  Administered Date(s) Administered   PFIZER(Purple Top)SARS-COV-2 Vaccination 08/22/2019, 09/12/2019, 06/05/2020   PPD Test 05/02/2013, 06/18/2014, 07/27/2016, 08/22/2017, 09/12/2018   Td 07/01/2015   Health Maintenance  Topic Date Due   Zoster Vaccines- Shingrix (1 of 2) Never done   PNA vac Low Risk Adult (1 of 2 - PCV13) Never done   COVID-19 Vaccine (4 - Booster for Pfizer series) 09/03/2020   INFLUENZA VACCINE  12/22/2020   MAMMOGRAM  03/07/2021   TETANUS/TDAP  06/30/2025   COLONOSCOPY (Pts 45-44yr Insurance coverage will need to be confirmed)  02/26/2030   DEXA SCAN  Completed   Hepatitis C Screening  Completed   HPV VACCINES  Aged Out   Eye Exam-  overdue , pt aware she should schedule PAP DR. HENLEY 2020, needs to schedule DEXA 10/21 normal MGM 10/21 negative Colonoscopy 02/2020- no significant pathologic changes, F/U 5 years suggested PREVNAR: WANTS TO WAIT  SURGICAL HISTORY She  has a past surgical history that includes Shoulder surgery (Left, 2007). FAMILY HISTORY Her family history includes Cancer in her father; Diabetes in her brother and sister; Heart disease in her mother; Mental illness in her sister; Multiple myeloma in her father; Obstructive Sleep Apnea in her brother. SOCIAL HISTORY She  reports that she has never smoked. She has never used smokeless tobacco. She reports current alcohol use of about 6.0 standard drinks of alcohol per week.   Review of Systems:  Review of Systems  Constitutional:  Negative for chills, fever, malaise/fatigue and weight loss.  HENT:  Negative for congestion, ear discharge, hearing loss, sore throat and tinnitus.   Eyes:  Negative for blurred vision, double vision, discharge and redness.       Floater in right eye, has been several years  Respiratory:  Negative for cough, shortness of breath and wheezing.   Cardiovascular:  Positive for leg swelling (feet bilaterally). Negative for chest pain, palpitations, orthopnea and claudication.  Gastrointestinal:  Positive for diarrhea (if eats something tha upsets stomach). Negative for abdominal pain, blood in stool, constipation, heartburn, melena, nausea and vomiting.  Genitourinary: Negative.  Negative for dysuria, frequency and urgency.  Musculoskeletal:  Positive for joint pain. Negative for back pain (r knee) and falls. Myalgias: right arm. Skin:  Negative for rash.  Neurological:  Negative for dizziness, tingling, sensory change, seizures, weakness and headaches.  Endo/Heme/Allergies:  Negative for polydipsia. Does not bruise/bleed easily.  Psychiatric/Behavioral:  Negative for depression, hallucinations, memory loss and suicidal ideas.  The patient has insomnia (wakes up frequently). The patient is not nervous/anxious.   All other systems reviewed and are negative.  Physical Exam: BP (!) 150/82   Pulse (!) 102   Temp (!) 97.5 F (36.4 C)   Wt (!) 338 lb (153.3 kg)   SpO2 92%   BMI 52.94 kg/m  Wt Readings from Last 3 Encounters:  12/03/20 (!) 338 lb (153.3 kg)  07/17/20 (!) 349 lb (158.3 kg)  03/18/20 (!) 346 lb (156.9 kg)   General Appearance: Morbidly obese, in no apparent distress. Eyes: PERRLA, EOMs, conjunctiva no swelling or erythema Sinuses: No Frontal/maxillary tenderness ENT/Mouth: Ext aud canals clear, TMs without erythema, bulging. No erythema, swelling, or exudate on post pharynx.  Tonsils not swollen or erythematous. Hearing normal.  Neck: Supple, thyroid normal.  Respiratory: Respiratory effort normal, BS equal bilaterally without rales, rhonchi, wheezing or stridor.  Cardio: Distant heart sounds due to body habitus; RRR with no audible MRGs. Brisk peripheral pulses with some 1+ pitting edema to bilateral ankles.  Abdomen: Soft, obese abdomen, + BS.  Non tender, no guarding, rebound, hernias, masses. Lymphatics: Non tender without lymphadenopathy.  Musculoskeletal: Full ROM, 5/5 strength, Normal gait Skin: Warm, dry without rashes, lesions, ecchymosis.  Neuro: Cranial nerves intact. No cerebellar symptoms.  Psych: Awake and oriented X 3, normal affect, Insight and Judgment appropriate.  EKG- normal  Tyleek Smick Kathyrn Drown, NP 2:39 PM [facility]

## 2020-12-03 ENCOUNTER — Other Ambulatory Visit: Payer: Self-pay

## 2020-12-03 ENCOUNTER — Encounter: Payer: Self-pay | Admitting: Nurse Practitioner

## 2020-12-03 ENCOUNTER — Ambulatory Visit (INDEPENDENT_AMBULATORY_CARE_PROVIDER_SITE_OTHER): Payer: 59 | Admitting: Nurse Practitioner

## 2020-12-03 ENCOUNTER — Other Ambulatory Visit: Payer: Self-pay | Admitting: Adult Health

## 2020-12-03 VITALS — BP 150/82 | HR 102 | Temp 97.5°F | Wt 338.0 lb

## 2020-12-03 DIAGNOSIS — Z0000 Encounter for general adult medical examination without abnormal findings: Secondary | ICD-10-CM

## 2020-12-03 DIAGNOSIS — Z1389 Encounter for screening for other disorder: Secondary | ICD-10-CM

## 2020-12-03 DIAGNOSIS — D573 Sickle-cell trait: Secondary | ICD-10-CM

## 2020-12-03 DIAGNOSIS — I10 Essential (primary) hypertension: Secondary | ICD-10-CM

## 2020-12-03 DIAGNOSIS — Z136 Encounter for screening for cardiovascular disorders: Secondary | ICD-10-CM

## 2020-12-03 DIAGNOSIS — M25562 Pain in left knee: Secondary | ICD-10-CM

## 2020-12-03 DIAGNOSIS — R609 Edema, unspecified: Secondary | ICD-10-CM

## 2020-12-03 DIAGNOSIS — Z1329 Encounter for screening for other suspected endocrine disorder: Secondary | ICD-10-CM

## 2020-12-03 DIAGNOSIS — N816 Rectocele: Secondary | ICD-10-CM

## 2020-12-03 DIAGNOSIS — G8929 Other chronic pain: Secondary | ICD-10-CM

## 2020-12-03 DIAGNOSIS — M109 Gout, unspecified: Secondary | ICD-10-CM

## 2020-12-03 DIAGNOSIS — R7309 Other abnormal glucose: Secondary | ICD-10-CM

## 2020-12-03 DIAGNOSIS — E782 Mixed hyperlipidemia: Secondary | ICD-10-CM

## 2020-12-03 DIAGNOSIS — Z0001 Encounter for general adult medical examination with abnormal findings: Secondary | ICD-10-CM

## 2020-12-03 DIAGNOSIS — E559 Vitamin D deficiency, unspecified: Secondary | ICD-10-CM

## 2020-12-03 DIAGNOSIS — Z79899 Other long term (current) drug therapy: Secondary | ICD-10-CM

## 2020-12-03 MED ORDER — BUMETANIDE 1 MG PO TABS: 1.0000 mg | ORAL_TABLET | ORAL | 4 refills | Status: DC | PRN

## 2020-12-03 NOTE — Patient Instructions (Signed)
GENERAL HEALTH GOALS   Know what a healthy weight is for you (roughly BMI <25) and aim to maintain this   Aim for 7+ servings of fruits and vegetables daily   70-80+ fluid ounces of water or unsweet tea for healthy kidneys   Limit to max 1 drink of alcohol per day; avoid smoking/tobacco   Limit animal fats in diet for cholesterol and heart health - choose grass fed whenever available   Avoid highly processed foods, and foods high in saturated/trans fats   Aim for low stress - take time to unwind and care for your mental health   Aim for 150 min of moderate intensity exercise weekly for heart health, and weights twice weekly for bone health   Aim for 7-9 hours of sleep daily  Arthritis Arthritis is a term that is commonly used to refer to joint pain or jointdisease. There are more than 100 types of arthritis. What are the causes? The most common cause of this condition is wear and tear of a joint. Other causes include: Gout. Inflammation of a joint. An infection of a joint. Sprains and other injuries near the joint. A reaction to medicines or drugs, or an allergic reaction. In some cases, the cause may not be known. What are the signs or symptoms? The main symptom of this condition is pain in the joint during movement. Other symptoms include: Redness, swelling, or stiffness at a joint. Warmth coming from the joint. Fever. Overall feeling of illness. How is this diagnosed? This condition may be diagnosed with a physical exam and tests, including: Blood tests. Urine tests. Imaging tests, such as X-rays, an MRI, or a CT scan. Sometimes, fluid is removed from a joint for testing. How is this treated? This condition may be treated with: Treatment of the cause, if it is known. Rest. Raising (elevating) the joint. Applying cold or hot packs to the joint. Medicines to improve symptoms and reduce inflammation. Injections of a steroid such as cortisone into the joint to help  reduce pain and inflammation. Depending on the cause of your arthritis, you may need to make lifestyle changes to reduce stress on your joint. Changes may include: Exercising more. Losing weight. Follow these instructions at home: Medicines Take over-the-counter and prescription medicines only as told by your health care provider. Do not take aspirin to relieve pain if your health care provider thinks that gout may be causing your pain. Activity Rest your joint if told by your health care provider. Rest is important when your disease is active and your joint feels painful, swollen, or stiff. Avoid activities that make the pain worse. It is important to balance activity with rest. Exercise your joint regularly with range-of-motion exercises as told by your health care provider. Try doing low-impact exercise, such as: Swimming. Water aerobics. Biking. Walking. Managing pain, stiffness, and swelling     If directed, put ice on the joint. Put ice in a plastic bag. Place a towel between your skin and the bag. Leave the ice on for 20 minutes, 2-3 times per day. If your joint is swollen, raise (elevate) it above the level of your heart if directed by your health care provider. If your joint feels stiff in the morning, try taking a warm shower. If directed, apply heat to the affected area as often as told by your health care provider. Use the heat source that your health care provider recommends, such as a moist heat pack or a heating pad. If you have  diabetes, do not apply heat without permission from your health care provider. To apply heat: Place a towel between your skin and the heat source. Leave the heat on for 20-30 minutes. Remove the heat if your skin turns bright red. This is especially important if you are unable to feel pain, heat, or cold. You may have a greater risk of getting burned. General instructions Do not use any products that contain nicotine or tobacco, such as  cigarettes, e-cigarettes, and chewing tobacco. If you need help quitting, ask your health care provider. Keep all follow-up visits as told by your health care provider. This is important. Contact a health care provider if: The pain gets worse. You have a fever. Get help right away if: You develop severe joint pain, swelling, or redness. Many joints become painful and swollen. You develop severe back pain. You develop severe weakness in your leg. You cannot control your bladder or bowels. Summary Arthritis is a term that is commonly used to refer to joint pain or joint disease. There are more than 100 types of arthritis. The most common cause of this condition is wear and tear of a joint. Other causes include gout, inflammation or infection of the joint, sprains, or allergies. Symptoms of this condition include redness, swelling, or stiffness of the joint. Other symptoms include warmth, fever, or feeling ill. This condition is treated with rest, elevation, medicines, and applying cold or hot packs. Follow your health care provider's instructions about medicines, activity, exercises, and other home care treatments. This information is not intended to replace advice given to you by your health care provider. Make sure you discuss any questions you have with your healthcare provider. Document Revised: 04/17/2018 Document Reviewed: 04/17/2018 Elsevier Patient Education  2022 [employer].   Obesity, Adult Obesity is the condition of having too much total body fat. Being overweight or obese means that your weight is greater than what is considered healthy for your body size. Obesity is determined by a measurement called BMI. BMI is an estimate of body fat and is calculated from height and weight. For adults, aBMI of 30 or higher is considered obese. Obesity can lead to other health concerns and major illnesses, including: Stroke. Coronary artery disease (CAD). Type 2 diabetes. Some types of  cancer, including cancers of the colon, breast, uterus, and gallbladder. Osteoarthritis. High blood pressure (hypertension). High cholesterol. Sleep apnea. Gallbladder stones. Infertility problems. What are the causes? Common causes of this condition include: Eating daily meals that are high in calories, sugar, and fat. Being born with genes that may make you more likely to become obese. Having a medical condition that causes obesity, including: Hypothyroidism. Polycystic ovarian syndrome (PCOS). Binge-eating disorder. Cushing syndrome. Taking certain medicines, such as steroids, antidepressants, and seizure medicines. Not being physically active (sedentary lifestyle). Not getting enough sleep. Drinking high amounts of sugar-sweetened beverages, such as soft drinks. What increases the risk? The following factors may make you more likely to develop this condition: Having a family history of obesity. Being a woman of African American descent. Being a man of Hispanic descent. Living in an area with limited access to: [place], recreation centers, or sidewalks. Healthy food choices, such as grocery stores and farmers' markets. What are the signs or symptoms? The main sign of this condition is having too much body fat. How is this diagnosed? This condition is diagnosed based on: Your BMI. If you are an adult with a BMI of 30 or higher, you are considered obese. Your  waist circumference. This measures the distance around your waistline. Your skinfold thickness. Your health care provider may gently pinch a fold of your skin and measure it. You may have other tests to check for underlying conditions. How is this treated? Treatment for this condition often includes changing your lifestyle. Treatment may include some or all of the following: Dietary changes. This may include developing a healthy meal plan. Regular physical activity. This may include activity that causes your heart to beat  faster (aerobic exercise) and strength training. Work with your health care provider to design an exercise program that works for you. Medicine to help you lose weight if you are unable to lose 1 pound a week after 6 weeks of healthy eating and more physical activity. Treating conditions that cause the obesity (underlying conditions). Surgery. Surgical options may include gastric banding and gastric bypass. Surgery may be done if: Other treatments have not helped to improve your condition. You have a BMI of 40 or higher. You have life-threatening health problems related to obesity. Follow these instructions at home: Eating and drinking  Follow recommendations from your health care provider about what you eat and drink. Your health care provider may advise you to: Limit fast food, sweets, and processed snack foods. Choose low-fat options, such as low-fat milk instead of whole milk. Eat 5 or more servings of fruits or vegetables every day. Eat at home more often. This gives you more control over what you eat. Choose healthy foods when you eat out. Learn to read food labels. This will help you understand how much food is considered 1 serving. Learn what a healthy serving size is. Keep low-fat snacks available. Limit sugary drinks, such as soda, fruit juice, sweetened iced tea, and flavored milk. Drink enough water to keep your urine pale yellow. Do not follow a fad diet. Fad diets can be unhealthy and even dangerous.  Physical activity Exercise regularly, as told by your health care provider. Most adults should get up to 150 minutes of moderate-intensity exercise every week. Ask your health care provider what types of exercise are safe for you and how often you should exercise. Warm up and stretch before being active. Cool down and stretch after being active. Rest between periods of activity. Lifestyle Work with your health care provider and a dietitian to set a weight-loss goal that is  healthy and reasonable for you. Limit your screen time. Find ways to reward yourself that do not involve food. Do not drink alcohol if: Your health care provider tells you not to drink. You are pregnant, may be pregnant, or are planning to become pregnant. If you drink alcohol: Limit how much you use to: 0-1 drink a day for women. 0-2 drinks a day for men. Be aware of how much alcohol is in your drink. In the [place] one drink equals one 12 oz bottle of beer (355 mL), one 5 oz glass of wine (148 mL), or one 1 oz glass of hard liquor (44 mL). General instructions Keep a weight-loss journal to keep track of the food you eat and how much exercise you get. Take over-the-counter and prescription medicines only as told by your health care provider. Take vitamins and supplements only as told by your health care provider. Consider joining a support group. Your health care provider may be able to recommend a support group. Keep all follow-up visits as told by your health care provider. This is important. Contact a health care provider if: You are unable to  meet your weight loss goal after 6 weeks of dietary and lifestyle changes. Get help right away if you are having: Trouble breathing. Suicidal thoughts or behaviors. Summary Obesity is the condition of having too much total body fat. Being overweight or obese means that your weight is greater than what is considered healthy for your body size. Work with your health care provider and a dietitian to set a weight-loss goal that is healthy and reasonable for you. Exercise regularly, as told by your health care provider. Ask your health care provider what types of exercise are safe for you and how often you should exercise. This information is not intended to replace advice given to you by your health care provider. Make sure you discuss any questions you have with your healthcare provider. Document Revised: 01/12/2018 Document Reviewed:  01/12/2018 Elsevier Patient Education  2022 [employer].

## 2020-12-04 ENCOUNTER — Encounter: Payer: Self-pay | Admitting: Nurse Practitioner

## 2020-12-04 LAB — COMPLETE METABOLIC PANEL WITH GFR
AG Ratio: 1.2 (calc) (ref 1.0–2.5)
ALT: 15 U/L (ref 6–29)
AST: 16 U/L (ref 10–35)
Albumin: 4.1 g/dL (ref 3.6–5.1)
Alkaline phosphatase (APISO): 126 U/L (ref 37–153)
BUN: 10 mg/dL (ref 7–25)
CO2: 25 mmol/L (ref 20–32)
Calcium: 9 mg/dL (ref 8.6–10.4)
Chloride: 102 mmol/L (ref 98–110)
Creat: 0.73 mg/dL (ref 0.50–1.05)
Globulin: 3.5 g/dL (calc) (ref 1.9–3.7)
Glucose, Bld: 109 mg/dL — ABNORMAL HIGH (ref 65–99)
Potassium: 4.4 mmol/L (ref 3.5–5.3)
Sodium: 138 mmol/L (ref 135–146)
Total Bilirubin: 0.8 mg/dL (ref 0.2–1.2)
Total Protein: 7.6 g/dL (ref 6.1–8.1)
eGFR: 91 mL/min/{1.73_m2} (ref >=60)

## 2020-12-04 LAB — CBC WITH DIFFERENTIAL/PLATELET
Absolute Monocytes: 421 cells/uL (ref 200–950)
Basophils Absolute: 43 cells/uL (ref 0–200)
Basophils Relative: 0.7 %
Eosinophils Absolute: 153 cells/uL (ref 15–500)
Eosinophils Relative: 2.5 %
HCT: 37.1 % (ref 35.0–45.0)
Hemoglobin: 12.3 g/dL (ref 11.7–15.5)
Lymphs Abs: 1891 cells/uL (ref 850–3900)
MCH: 29.1 pg (ref 27.0–33.0)
MCHC: 33.2 g/dL (ref 32.0–36.0)
MCV: 87.9 fL (ref 80.0–100.0)
MPV: 11 fL (ref 7.5–12.5)
Monocytes Relative: 6.9 %
Neutro Abs: 3593 cells/uL (ref 1500–7800)
Neutrophils Relative %: 58.9 %
Platelets: 204 10*3/uL (ref 140–400)
RBC: 4.22 10*6/uL (ref 3.80–5.10)
RDW: 13.4 % (ref 11.0–15.0)
Total Lymphocyte: 31 %
WBC: 6.1 10*3/uL (ref 3.8–10.8)

## 2020-12-04 LAB — LIPID PANEL
Cholesterol: 171 mg/dL (ref <200)
HDL: 59 mg/dL (ref >=50)
LDL Cholesterol (Calc): 95 mg/dL (calc)
Non-HDL Cholesterol (Calc): 112 mg/dL (calc) (ref <130)
Total CHOL/HDL Ratio: 2.9 (calc) (ref <5.0)
Triglycerides: 76 mg/dL (ref <150)

## 2020-12-04 LAB — MAGNESIUM: Magnesium: 2.1 mg/dL (ref 1.5–2.5)

## 2020-12-04 LAB — VITAMIN D 25 HYDROXY (VIT D DEFICIENCY, FRACTURES): Vit D, 25-Hydroxy: 10 ng/mL — ABNORMAL LOW (ref 30–100)

## 2020-12-04 LAB — TSH: TSH: 2.49 mIU/L (ref 0.40–4.50)

## 2020-12-05 LAB — URINALYSIS, ROUTINE W REFLEX MICROSCOPIC
Bilirubin Urine: NEGATIVE
Glucose, UA: NEGATIVE
Hgb urine dipstick: NEGATIVE
Hyaline Cast: NONE SEEN /LPF
Ketones, ur: NEGATIVE
Nitrite: NEGATIVE
Protein, ur: NEGATIVE
Specific Gravity, Urine: 1.013 (ref 1.001–1.035)
pH: 5.5 (ref 5.0–8.0)

## 2020-12-05 LAB — MICROALBUMIN / CREATININE URINE RATIO
Creatinine, Urine: 97 mg/dL (ref 20–275)
Microalb Creat Ratio: 8 mcg/mg creat (ref <30)
Microalb, Ur: 0.8 mg/dL

## 2020-12-05 LAB — MICROSCOPIC MESSAGE

## 2021-01-08 ENCOUNTER — Ambulatory Visit: Payer: 59 | Admitting: Podiatry

## 2021-01-12 ENCOUNTER — Other Ambulatory Visit: Payer: Self-pay | Admitting: Adult Health

## 2021-01-27 ENCOUNTER — Ambulatory Visit: Payer: 59 | Admitting: Podiatry

## 2021-01-27 ENCOUNTER — Encounter: Payer: Self-pay | Admitting: Podiatry

## 2021-01-27 ENCOUNTER — Other Ambulatory Visit: Payer: Self-pay

## 2021-01-27 DIAGNOSIS — D2371 Other benign neoplasm of skin of right lower limb, including hip: Secondary | ICD-10-CM | POA: Diagnosis not present

## 2021-01-27 DIAGNOSIS — B351 Tinea unguium: Secondary | ICD-10-CM

## 2021-01-27 DIAGNOSIS — M79676 Pain in unspecified toe(s): Secondary | ICD-10-CM

## 2021-01-27 DIAGNOSIS — D2372 Other benign neoplasm of skin of left lower limb, including hip: Secondary | ICD-10-CM

## 2021-01-27 NOTE — Progress Notes (Signed)
She presents today chief complaint of painful elongated nails and calluses bilaterally.  Objective: Toenails are long thick yellow dystrophic with mycotic benign skin lesions plantar aspect bilateral foot.  Assessment: Pain limb secondary onychomycosis and benign skin lesions.  Plan: Debridement of nails 1 through 5 bilateral benign skin lesions.

## 2021-03-05 ENCOUNTER — Other Ambulatory Visit: Payer: Self-pay | Admitting: Obstetrics and Gynecology

## 2021-03-05 DIAGNOSIS — Z1231 Encounter for screening mammogram for malignant neoplasm of breast: Secondary | ICD-10-CM

## 2021-03-10 NOTE — Progress Notes (Deleted)
FOLLOW UP 3 MONTH  Assessment and Plan:   Alicia Diaz was seen today for follow-up.  Diagnoses and all orders for this visit:  Essential hypertension       Using Bumex 27m PRN Monitor blood pressure at home; call if consistently over 130/80 Continue DASH diet.   Reminder to go to the ER if any CP, SOB, nausea, dizziness, severe HA, changes vision/speech, left arm numbness and tingling and jaw pain. -     CBC with Differential/Platelet -     COMPLETE METABOLIC PANEL WITH GFR -     Hemoglobin A1c  Sickle cell trait (HCC) Monitor  Morbid obesity with BMI of 50.0-59.9, adult (HCC) Discussed dietary and exercise modifications Discussed further weight loss and management to improve health, Wegovy? -     CBC with Differential/Platelet -     COMPLETE METABOLIC PANEL WITH GFR -     Hemoglobin A1c  Abnormal glucose Discussed dietary and exercise modifications -     Hemoglobin A1c  Hyperlipidemia, mixed Not on medications at this time Discussed dietary and exercise modifications Low fat diet -     Lipid panel   Vitamin D deficiency Continue supplementation to maintain goal of 70-100 Not taking supplementation at this time Defer vitamin D level   Idiopathic gout, unspecified chronicity, unspecified site Continue allopurinol 3014mdaily No recent flares Discussed dietary modifications Continue to monitor  Chronic pain of both knees Had Ortho consult, weight loss advised Discussed with patient  Medication management Continued    Continue diet and meds as discussed. Further disposition pending results of labs. Discussed med's effects and SE's.   Over 30 minutes of face to face interview, exam, counseling, chart review, and critical decision making was performed.   Future Appointments  Date Time Provider [place]/19/20[place] 11:30 AM Alicia Diaz [facility] None  04/06/2021  1:10 PM [facility] MM 2 [facility]  04/30/2021 10:45 AM Diaz, Alicia T, DPM  [facility]  12/03/2021  2:00 PM Alicia Diaz, Alicia Diaz [facility] None    ----------------------------------------------------------------------------------------------------------------------  [name] female  presents for 3 month follow up on HTN, HLD, history of prediabetes, morbid obesity (BMI 50+) and vitamin D deficiency.   She reports overall she is doing well, she does not have any health or medication concerns today.  She has bilateral knee OA, last saw Dr. [name] 2015, states her right knee has been bothering her more. She has to use a cane occ because she feels she will fall, she has pain.   She recently had a colonoscopy and was told she has ulcers, will look for report.  She wanted to start taking meloxicam again for her knees but reports it was not recommended.  She has been stressful with her job, has new fiMedia Diaz she does not see eye to eye with, has to drive to Alicia Diaz her job.   BMI is There is no height or weight on file to calculate BMI., she has not been working on diet and exercise. Admits she will commonly not eat all day then eat one very large meal at the end of the day.  She is in agreement she needs to make changes. Not currently checking weights, needs new scale.   Down 12 lbs since 10/2019.  Discussed further weight loss management with patient.  [place]?  Wt Readings from Last 3 Encounters:  12/03/20 (!) 338 lb (153.3 kg)  07/17/20 (!) 349 lb (158.3 kg)  03/18/20 (!) 346 lb (156.9 kg)   She reports she has not checked her BP recently but bought new BP cuff, today their BP is   by provider manual recheck  135/90.  Discussed monitoring this at home and goal for patient.   She does not workout. She denies chest pain, shortness of breath, dizziness.    She is not on cholesterol medication and denies myalgias. Her cholesterol is at goal. The cholesterol last visit was:   Lab Results  Component Value Date   CHOL 171 12/03/2020   HDL 59  12/03/2020   LDLCALC 95 12/03/2020   TRIG 76 12/03/2020   CHOLHDL 2.9 12/03/2020    She has not been working on diet and exercise for history of prediabetes, and denies increased appetite, nausea, paresthesia of the feet, polydipsia, polyuria, visual disturbances, vomiting and weight loss. Last A1C in the office was:  Lab Results  Component Value Date   HGBA1C 5.7 (H) 07/17/2020   Patient is not regular on Vitamin D supplement and remains very low at last check:   Lab Results  Component Value Date   VD25OH 10 (L) 12/03/2020     Patient is on allopurinol for gout and does not report a recent flare.  Lab Results  Component Value Date   LABURIC 6.0 04/02/2019      Current Medications:    Current Outpatient Medications (Cardiovascular):    bumetanide (BUMEX) 1 MG tablet, Take 1 tablet (1 mg total) by mouth as needed.   doxazosin (CARDURA) 8 MG tablet, Take  1 tablet  at Bedtime  for BP   Current Outpatient Medications (Analgesics):    acetaminophen (TYLENOL) 500 MG tablet, Take 500 mg by mouth every 6 (six) hours as needed.   allopurinol (ZYLOPRIM) 300 MG tablet, TAKE 1 TABLET DAILY TO PREVENT GOUT   aspirin 81 MG tablet, Take 81 mg by mouth daily.   Current Outpatient Medications (Other):    gabapentin (NEURONTIN) 600 MG tablet, TAKE 1/2 TO 1 TABLET 3 X /DAY AS NEEDED FOR CHRONIC PAIN   Allergies:  Allergies  Allergen Reactions   Ace Inhibitors     Cough   Losartan    Paxil [Paroxetine Hcl]     Mood swings   Prednisone     Nausea/ irratated   Vitamin D Analogs     Cramping     Medical History:  Past Medical History:  Diagnosis Date   Anemia    DDD (degenerative disc disease)    DJD (degenerative joint disease)    Elevated hemoglobin A1c    GERD (gastroesophageal reflux disease)    Hyperlipidemia    Hypertension    Obesity    Sickle cell trait ([place])    Immunization History  Administered Date(s) Administered   PFIZER(Purple Top)SARS-COV-2 Vaccination  08/22/2019, 09/12/2019, 06/05/2020   PPD Test 05/02/2013, 06/18/2014, 07/27/2016, 08/22/2017, 09/12/2018   Td 07/01/2015   Health Maintenance  Topic Date Due   Zoster Vaccines- Shingrix (1 of 2) Never done   COVID-19 Vaccine (4 - Booster for Pfizer series) 08/28/2020   INFLUENZA VACCINE  Never done   MAMMOGRAM  03/07/2021   TETANUS/TDAP  06/30/2025   COLONOSCOPY (Pts 45-49yr Insurance coverage will need to be confirmed)  02/26/2030   DEXA SCAN  Completed   Hepatitis C Screening  Completed   HPV VACCINES  Aged Out   PAP: DR. HUlanda Edison2020 DEXA 02/2020 MGM 02/2020 Colonoscopy 02/2020 PREVNAR: Discussed with patient, DUE  SURGICAL HISTORY She  has  a past surgical history that includes Shoulder surgery (Left, 2007). FAMILY HISTORY Her family history includes Cancer in her father; Diabetes in her brother and sister; Heart disease in her mother; Mental illness in her sister; Multiple myeloma in her father; Obstructive Sleep Apnea in her brother. SOCIAL HISTORY She  reports that she has never smoked. She has never used smokeless tobacco. She reports current alcohol use of about 6.0 standard drinks per week.   Review of Systems:  Review of Systems  Constitutional:  Negative for malaise/fatigue and weight loss.  HENT:  Negative for hearing loss and tinnitus.   Eyes:  Negative for blurred vision and double vision.  Respiratory:  Negative for cough, shortness of breath and wheezing.   Cardiovascular:  Negative for chest pain, palpitations, orthopnea, claudication and leg swelling.  Gastrointestinal:  Negative for abdominal pain, blood in stool, constipation, diarrhea, heartburn, melena, nausea and vomiting.  Genitourinary: Negative.   Musculoskeletal:  Negative for joint pain and myalgias.  Skin:  Negative for rash.  Neurological:  Negative for dizziness, tingling, sensory change, weakness and headaches.  Endo/Heme/Allergies:  Negative for polydipsia.  Psychiatric/Behavioral: Negative.   Negative for depression. The patient is not nervous/anxious and does not have insomnia.   All other systems reviewed and are negative.  Physical Exam: There were no vitals taken for this visit. Wt Readings from Last 3 Encounters:  12/03/20 (!) 338 lb (153.3 kg)  07/17/20 (!) 349 lb (158.3 kg)  03/18/20 (!) 346 lb (156.9 kg)   General Appearance: Well nourished, morbidly obese, in no apparent distress. Eyes: PERRLA, EOMs, conjunctiva no swelling or erythema Sinuses: No Frontal/maxillary tenderness ENT/Mouth: Ext aud canals clear, TMs without erythema, bulging. No erythema, swelling, or exudate on post pharynx.  Tonsils not swollen or erythematous. Hearing normal.  Neck: Supple, thyroid normal.  Respiratory: Respiratory effort normal, BS equal bilaterally without rales, rhonchi, wheezing or stridor.  Cardio: Distant heart sounds due to body habitus; RRR with no audible MRGs. Brisk peripheral pulses with some non-pitting edema to bilateral ankles.  Abdomen: Soft, obese abdomen, + BS.  Non tender, no guarding, rebound, hernias, masses. Lymphatics: Non tender without lymphadenopathy.  Musculoskeletal: Full ROM, 5/5 strength, Normal gait Skin: Warm, dry without rashes, lesions, ecchymosis.  Neuro: Cranial nerves intact. No cerebellar symptoms.  Psych: Awake and oriented X 3, normal affect, Insight and Judgment appropriate.    Magda Bernheim, NP 8:51 AM [facility]

## 2021-03-11 ENCOUNTER — Ambulatory Visit: Payer: 59 | Admitting: Nurse Practitioner

## 2021-03-24 NOTE — Progress Notes (Signed)
FOLLOW UP 3 MONTH  Assessment and Plan:   Alicia Diaz was seen today for follow-up.  Diagnoses and all orders for this visit:  Essential hypertension       Using Bumex 55m PRN, Cardura [id number] Monitor blood pressure at home; call if consistently over 130/80 Continue DASH diet.   Reminder to go to the ER if any CP, SOB, nausea, dizziness, severe HA, changes vision/speech, left arm numbness and tingling and jaw pain. -     CBC with Differential/Platelet -     COMPLETE METABOLIC PANEL WITH GFR -     Hemoglobin A1c  Sickle cell trait (HCC) Monitor  Morbid obesity with BMI of 50.0-59.9, adult (HCC) Discussed dietary and exercise modifications Discussed further weight loss and management to improve health, offered Saxenda or Wegovy, pt refuses- not currently receptive to other treatment -     CBC with Differential/Platelet -     COMPLETE METABOLIC PANEL WITH GFR -     Hemoglobin A1c  Abnormal glucose Discussed dietary and exercise modifications -     Hemoglobin A1c  Hyperlipidemia, mixed Not on medications at this time Discussed dietary and exercise modifications Low fat diet -     Lipid panel   Vitamin D deficiency Continue supplementation to maintain goal of 70-100 Not taking supplementation at this time Defer vitamin D level   Idiopathic gout, unspecified chronicity, unspecified site Continue allopurinol 30022maily No recent flares Discussed dietary modifications Continue to monitor  Chronic pain of both knees Strongly encouraged weight loss Unable to take NSAIDs due to ulcerations on ileocecal valve on colonoscopy, continue Tylenol PRN   Medication management Continued    Continue diet and meds as discussed. Further disposition pending results of labs. Discussed med's effects and SE's.   Over 30 minutes of face to face interview, exam, counseling, chart review, and critical decision making was performed.   Future Appointments  Date Time [place]  1:10 PM [facility] MM 2 [facility]  04/30/2021 10:45 AM Hyatt, Max T, DPM [facility]  12/03/2021  2:00 PM Heidee Audi, DanTownsend RogerP [facility] None    ----------------------------------------------------------------------------------------------------------------------  HPI [age]o. female  presents for 3 month follow up on HTN, HLD, history of prediabetes, morbid obesity (BMI 50+) and vitamin D deficiency.   She reports overall she is doing well, she does not have any health or medication concerns today.  She has bilateral knee OA, last saw Dr. RowMayer Camel 2015, states her right knee has been bothering her more. She is taking Tylenol arthritis. Had steroid injections in the past with minimal relief.  She recently had a colonoscopy 09/2019 showed multiple ulcers of ileocecal valve most likely related to NSAID use so advised to avoid.    BMI is Body mass index is 54 kg/m., she has not been working on diet and exercise. Admits she will commonly not eat all day then eat one very large meal at the end of the day.  She is in agreement she needs to make changes. Not currently checking weights, needs new scale.  She has used Phentermine in the past but was unable to tolerate. She is very afraid of needles so does not wish to use Saxenda.  Has not been watching diet and exercise has been limited   Wt Readings from Last 3 Encounters:  03/26/21 (!) 344 lb 12.8 oz (156.4 kg)  12/03/20 (!) 338 lb (153.3 kg)  07/17/20 (!) 349 lb (158.3 kg)   She  reports she has not checked her BP recently but bought new BP cuff, today their BP is BP: 138/90   Recheck by NP 128/84 Discussed monitoring this at home and goal for patient.   She does not workout. She denies chest pain, shortness of breath, dizziness.    She is not on cholesterol medication and denies myalgias. Her cholesterol is at goal. The cholesterol last visit was:   Lab Results  Component Value Date   CHOL 171 12/03/2020    HDL 59 12/03/2020   LDLCALC 95 12/03/2020   TRIG 76 12/03/2020   CHOLHDL 2.9 12/03/2020    She has not been working on diet and exercise for history of prediabetes, and denies increased appetite, nausea, paresthesia of the feet, polydipsia, polyuria, visual disturbances, vomiting and weight loss. Last A1C in the office was:  Lab Results  Component Value Date   HGBA1C 5.7 (H) 07/17/2020   Patient is not regular on Vitamin D supplement and remains very low at last check:   Lab Results  Component Value Date   VD25OH 10 (L) 12/03/2020     Patient is on allopurinol for gout and does not report a recent flare.  Lab Results  Component Value Date   LABURIC 6.0 04/02/2019      Current Medications:    Current Outpatient Medications (Cardiovascular):    bumetanide (BUMEX) 1 MG tablet, Take 1 tablet (1 mg total) by mouth as needed.   doxazosin (CARDURA) 8 MG tablet, Take  1 tablet  at Bedtime  for BP   Current Outpatient Medications (Analgesics):    acetaminophen (TYLENOL) 500 MG tablet, Take 500 mg by mouth every 6 (six) hours as needed.   allopurinol (ZYLOPRIM) 300 MG tablet, TAKE 1 TABLET DAILY TO PREVENT GOUT   aspirin 81 MG tablet, Take 81 mg by mouth daily.   Current Outpatient Medications (Other):    gabapentin (NEURONTIN) 600 MG tablet, TAKE 1/2 TO 1 TABLET 3 X /DAY AS NEEDED FOR CHRONIC PAIN   Allergies:  Allergies  Allergen Reactions   Ace Inhibitors     Cough   Losartan    Paxil [Paroxetine Hcl]     Mood swings   Prednisone     Nausea/ irratated   Vitamin D Analogs     Cramping     Medical History:  Past Medical History:  Diagnosis Date   Anemia    DDD (degenerative disc disease)    DJD (degenerative joint disease)    Elevated hemoglobin A1c    GERD (gastroesophageal reflux disease)    Hyperlipidemia    Hypertension    Obesity    Sickle cell trait ([place])    Immunization History  Administered Date(s) Administered   PFIZER(Purple Top)SARS-COV-2  Vaccination 08/22/2019, 09/12/2019, 06/05/2020   PPD Test 05/02/2013, 06/18/2014, 07/27/2016, 08/22/2017, 09/12/2018   Td 07/01/2015   Health Maintenance  Topic Date Due   Pneumonia Vaccine 80+ Years old (1 - PCV) Never done   Zoster Vaccines- Shingrix (1 of 2) Never done   COVID-19 Vaccine (4 - Booster for Pfizer series) 07/31/2020   INFLUENZA VACCINE  Never done   MAMMOGRAM  03/07/2021   TETANUS/TDAP  06/30/2025   COLONOSCOPY (Pts 45-53yr Insurance coverage will need to be confirmed)  02/26/2030   DEXA SCAN  Completed   Hepatitis C Screening  Completed   HPV VACCINES  Aged Out   PAP: DR. HUlanda Edison2020 DEXA 02/2020 MGM 02/2020 Colonoscopy 02/2020 PREVNAR: Discussed with patient, DUE  SURGICAL HISTORY She  has a past surgical history that includes Shoulder surgery (Left, 2007). FAMILY HISTORY Her family history includes Cancer in her father; Diabetes in her brother and sister; Heart disease in her mother; Mental illness in her sister; Multiple myeloma in her father; Obstructive Sleep Apnea in her brother. SOCIAL HISTORY She  reports that she has never smoked. She has never used smokeless tobacco. She reports current alcohol use of about 6.0 standard drinks per week.   Review of Systems:  Review of Systems  Constitutional:  Negative for chills, fever, malaise/fatigue and weight loss.  HENT:  Negative for congestion, hearing loss and tinnitus.   Eyes:  Negative for blurred vision and double vision.  Respiratory:  Negative for cough, shortness of breath and wheezing.   Cardiovascular:  Negative for chest pain, palpitations, orthopnea, claudication and leg swelling.  Gastrointestinal:  Negative for abdominal pain, blood in stool, constipation, diarrhea, heartburn, melena, nausea and vomiting.  Genitourinary: Negative.   Musculoskeletal:  Positive for joint pain. Negative for falls and myalgias.  Skin:  Negative for rash.  Neurological:  Negative for dizziness, tingling, tremors,  sensory change, loss of consciousness, weakness and headaches.  Endo/Heme/Allergies:  Negative for polydipsia.  Psychiatric/Behavioral: Negative.  Negative for depression, memory loss and suicidal ideas. The patient is not nervous/anxious and does not have insomnia.   All other systems reviewed and are negative.  Physical Exam: BP 138/90   Pulse 96   Temp (!) 97.3 F (36.3 C)   Wt (!) 344 lb 12.8 oz (156.4 kg)   SpO2 95%   BMI 54.00 kg/m  Wt Readings from Last 3 Encounters:  03/26/21 (!) 344 lb 12.8 oz (156.4 kg)  12/03/20 (!) 338 lb (153.3 kg)  07/17/20 (!) 349 lb (158.3 kg)   General Appearance: Morbidly obese, pleasant female in no apparent distress. Eyes: PERRLA, EOMs, conjunctiva no swelling or erythema Sinuses: No Frontal/maxillary tenderness ENT/Mouth: Ext aud canals clear, TMs without erythema, bulging. No erythema, swelling, or exudate on post pharynx.  Tonsils not swollen or erythematous. Hearing normal.  Neck: Supple, thyroid normal.  Respiratory: Respiratory effort normal, BS equal bilaterally without rales, rhonchi, wheezing or stridor.  Cardio: Distant heart sounds due to body habitus; RRR with no audible MRGs. Brisk peripheral pulses with some non-pitting edema to bilateral ankles.  Abdomen: Soft, obese abdomen, + BS.  Non tender, no guarding, rebound, hernias, masses. Lymphatics: Non tender without lymphadenopathy.  Musculoskeletal: Full ROM, 5/5 strength, antalgic gait- favors right knee Skin: Warm, dry without rashes, lesions, ecchymosis.  Neuro: Cranial nerves intact. No cerebellar symptoms.  Psych: Awake and oriented X 3, normal affect, Insight and Judgment appropriate.    Magda Bernheim, NP 11:56 AM [facility]

## 2021-03-26 ENCOUNTER — Ambulatory Visit: Payer: 59 | Admitting: Nurse Practitioner

## 2021-03-26 ENCOUNTER — Other Ambulatory Visit: Payer: Self-pay

## 2021-03-26 ENCOUNTER — Encounter: Payer: Self-pay | Admitting: Nurse Practitioner

## 2021-03-26 VITALS — BP 138/90 | HR 96 | Temp 97.3°F | Wt 344.8 lb

## 2021-03-26 DIAGNOSIS — M25562 Pain in left knee: Secondary | ICD-10-CM

## 2021-03-26 DIAGNOSIS — R7309 Other abnormal glucose: Secondary | ICD-10-CM

## 2021-03-26 DIAGNOSIS — E782 Mixed hyperlipidemia: Secondary | ICD-10-CM

## 2021-03-26 DIAGNOSIS — E559 Vitamin D deficiency, unspecified: Secondary | ICD-10-CM

## 2021-03-26 DIAGNOSIS — Z79899 Other long term (current) drug therapy: Secondary | ICD-10-CM

## 2021-03-26 DIAGNOSIS — G8929 Other chronic pain: Secondary | ICD-10-CM

## 2021-03-26 DIAGNOSIS — M109 Gout, unspecified: Secondary | ICD-10-CM

## 2021-03-26 DIAGNOSIS — I10 Essential (primary) hypertension: Secondary | ICD-10-CM

## 2021-03-26 DIAGNOSIS — M25561 Pain in right knee: Secondary | ICD-10-CM

## 2021-03-26 DIAGNOSIS — D573 Sickle-cell trait: Secondary | ICD-10-CM

## 2021-03-26 NOTE — Patient Instructions (Signed)
Osteoarthritis Osteoarthritis is a type of arthritis. It refers to joint pain or joint disease. Osteoarthritis affects tissue that covers the ends of bones in joints (cartilage). Cartilage acts as a cushion between the bones and helps them move smoothly. Osteoarthritis occurs when cartilage in the joints gets worn down. Osteoarthritis is sometimes called "wear and tear" arthritis. Osteoarthritis is the most common form of arthritis. It often occurs in older people. It is a condition that gets worse over time. The joints most often affected by this condition are in the fingers, toes, hips, knees, and spine, including the neck and lower back. What are the causes? This condition is caused by the wearing down of cartilage that covers the ends of bones. What increases the risk? The following factors may make you more likely to develop this condition: Being age 50 or older. Obesity. Overuse of joints. Past injury of a joint. Past surgery on a joint. Family history of osteoarthritis. What are the signs or symptoms? The main symptoms of this condition are pain, swelling, and stiffness in the joint. Other symptoms may include: An enlarged joint. More pain and further damage caused by small pieces of bone or cartilage that break off and float inside of the joint. Small deposits of bone (osteophytes) that grow on the edges of the joint. A grating or scraping feeling inside the joint when you move it. Popping or creaking sounds when you move. Difficulty walking or exercising. An inability to grip items, twist your hand(s), or control the movements of your hands and fingers. How is this diagnosed? This condition may be diagnosed based on: Your medical history. A physical exam. Your symptoms. X-rays of the affected joint(s). Blood tests to rule out other types of arthritis. How is this treated? There is no cure for this condition, but treatment can help control pain and improve joint function.  Treatment may include a combination of therapies, such as: Pain relief techniques, such as: Applying heat and cold to the joint. Massage. A form of talk therapy called cognitive behavioral therapy (CBT). This therapy helps you set goals and follow up on the changes that you make. Medicines for pain and inflammation. The medicines can be taken by mouth or applied to the skin. They include: NSAIDs, such as ibuprofen. Prescription medicines. Strong anti-inflammatory medicines (corticosteroids). Certain nutritional supplements. A prescribed exercise program. You may work with a physical therapist. Assistive devices, such as a brace, wrap, splint, specialized glove, or cane. A weight control plan. Surgery, such as: An osteotomy. This is done to reposition the bones and relieve pain or to remove loose pieces of bone and cartilage. Joint replacement surgery. You may need this surgery if you have advanced osteoarthritis. Follow these instructions at home: Activity Rest your affected joints as told by your health care provider. Exercise as told by your health care provider. He or she may recommend specific types of exercise, such as: Strengthening exercises. These are done to strengthen the muscles that support joints affected by arthritis. Aerobic activities. These are exercises, such as brisk walking or water aerobics, that increase your heart rate. Range-of-motion activities. These help your joints move more easily. Balance and agility exercises. Managing pain, stiffness, and swelling   If directed, apply heat to the affected area as often as told by your health care provider. Use the heat source that your health care provider recommends, such as a moist heat pack or a heating pad. If you have a removable assistive device, remove it as told by   your health care provider. Place a towel between your skin and the heat source. If your health care provider tells you to keep the assistive device on  while you apply heat, place a towel between the assistive device and the heat source. Leave the heat on for 20-30 minutes. Remove the heat if your skin turns bright red. This is especially important if you are unable to feel pain, heat, or cold. You may have a greater risk of getting burned. If directed, put ice on the affected area. To do this: If you have a removable assistive device, remove it as told by your health care provider. Put ice in a plastic bag. Place a towel between your skin and the bag. If your health care provider tells you to keep the assistive device on during icing, place a towel between the assistive device and the bag. Leave the ice on for 20 minutes, 2-3 times a day. Move your fingers or toes often to reduce stiffness and swelling. Raise (elevate) the injured area above the level of your heart while you are sitting or lying down. General instructions Take over-the-counter and prescription medicines only as told by your health care provider. Maintain a healthy weight. Follow instructions from your health care provider for weight control. Do not use any products that contain nicotine or tobacco, such as cigarettes, e-cigarettes, and chewing tobacco. If you need help quitting, ask your health care provider. Use assistive devices as told by your health care provider. Keep all follow-up visits as told by your health care provider. This is important. Where to find more information National Institute of Arthritis and Musculoskeletal and Skin Diseases: www.niams.nih.gov National Institute on Aging: www.nia.nih.gov American College of Rheumatology: www.rheumatology.org Contact a health care provider if: You have redness, swelling, or a feeling of warmth in a joint that gets worse. You have a fever along with joint or muscle aches. You develop a rash. You have trouble doing your normal activities. Get help right away if: You have pain that gets worse and is not relieved by  pain medicine. Summary Osteoarthritis is a type of arthritis that affects tissue covering the ends of bones in joints (cartilage). This condition is caused by the wearing down of cartilage that covers the ends of bones. The main symptom of this condition is pain, swelling, and stiffness in the joint. There is no cure for this condition, but treatment can help control pain and improve joint function. This information is not intended to replace advice given to you by your health care provider. Make sure you discuss any questions you have with your health care provider. Document Revised: 05/07/2019 Document Reviewed: 05/07/2019 Elsevier Patient Education  2022 [employer] Inc.  

## 2021-03-27 LAB — COMPLETE METABOLIC PANEL WITH GFR
AG Ratio: 1.1 (calc) (ref 1.0–2.5)
ALT: 15 U/L (ref 6–29)
AST: 20 U/L (ref 10–35)
Albumin: 4 g/dL (ref 3.6–5.1)
Alkaline phosphatase (APISO): 121 U/L (ref 37–153)
BUN: 18 mg/dL (ref 7–25)
CO2: 22 mmol/L (ref 20–32)
Calcium: 9.1 mg/dL (ref 8.6–10.4)
Chloride: 104 mmol/L (ref 98–110)
Creat: 0.85 mg/dL (ref 0.50–1.05)
Globulin: 3.6 g/dL (calc) (ref 1.9–3.7)
Glucose, Bld: 136 mg/dL — ABNORMAL HIGH (ref 65–99)
Potassium: 5 mmol/L (ref 3.5–5.3)
Sodium: 138 mmol/L (ref 135–146)
Total Bilirubin: 0.9 mg/dL (ref 0.2–1.2)
Total Protein: 7.6 g/dL (ref 6.1–8.1)
eGFR: 76 mL/min/{1.73_m2} (ref >=60)

## 2021-03-27 LAB — CBC WITH DIFFERENTIAL/PLATELET
Absolute Monocytes: 511 cells/uL (ref 200–950)
Basophils Absolute: 44 cells/uL (ref 0–200)
Basophils Relative: 0.6 %
Eosinophils Absolute: 110 cells/uL (ref 15–500)
Eosinophils Relative: 1.5 %
HCT: 37.1 % (ref 35.0–45.0)
Hemoglobin: 12.3 g/dL (ref 11.7–15.5)
Lymphs Abs: 1898 cells/uL (ref 850–3900)
MCH: 29.4 pg (ref 27.0–33.0)
MCHC: 33.2 g/dL (ref 32.0–36.0)
MCV: 88.5 fL (ref 80.0–100.0)
MPV: 11 fL (ref 7.5–12.5)
Monocytes Relative: 7 %
Neutro Abs: 4738 cells/uL (ref 1500–7800)
Neutrophils Relative %: 64.9 %
Platelets: 216 10*3/uL (ref 140–400)
RBC: 4.19 10*6/uL (ref 3.80–5.10)
RDW: 12.7 % (ref 11.0–15.0)
Total Lymphocyte: 26 %
WBC: 7.3 10*3/uL (ref 3.8–10.8)

## 2021-03-27 LAB — HEMOGLOBIN A1C
Hgb A1c MFr Bld: 5.5 % of total Hgb (ref <5.7)
Mean Plasma Glucose: 111 mg/dL
eAG (mmol/L): 6.2 mmol/L

## 2021-03-27 LAB — LIPID PANEL
Cholesterol: 193 mg/dL (ref <200)
HDL: 66 mg/dL (ref >=50)
LDL Cholesterol (Calc): 110 mg/dL (calc) — ABNORMAL HIGH
Non-HDL Cholesterol (Calc): 127 mg/dL (calc) (ref <130)
Total CHOL/HDL Ratio: 2.9 (calc) (ref <5.0)
Triglycerides: 77 mg/dL (ref <150)

## 2021-04-06 ENCOUNTER — Ambulatory Visit
Admission: RE | Admit: 2021-04-06 | Discharge: 2021-04-06 | Disposition: A | Discharge status: Home / Self Care | Payer: 59 | Source: Ambulatory Visit | Attending: Obstetrics and Gynecology | Admitting: Obstetrics and Gynecology

## 2021-04-06 DIAGNOSIS — Z1231 Encounter for screening mammogram for malignant neoplasm of breast: Secondary | ICD-10-CM

## 2021-04-07 ENCOUNTER — Ambulatory Visit
Admission: RE | Admit: 2021-04-07 | Discharge: 2021-04-07 | Disposition: A | Discharge status: Home / Self Care | Payer: 59 | Source: Ambulatory Visit | Attending: Obstetrics and Gynecology | Admitting: Obstetrics and Gynecology

## 2021-04-07 ENCOUNTER — Other Ambulatory Visit: Payer: Self-pay

## 2021-04-30 ENCOUNTER — Ambulatory Visit: Payer: 59 | Admitting: Podiatry

## 2021-06-02 ENCOUNTER — Encounter: Payer: Self-pay | Admitting: Podiatry

## 2021-06-02 ENCOUNTER — Ambulatory Visit: Payer: 59 | Admitting: Podiatry

## 2021-06-02 ENCOUNTER — Other Ambulatory Visit: Payer: Self-pay

## 2021-06-02 DIAGNOSIS — B351 Tinea unguium: Secondary | ICD-10-CM

## 2021-06-02 DIAGNOSIS — M79676 Pain in unspecified toe(s): Secondary | ICD-10-CM | POA: Diagnosis not present

## 2021-06-02 DIAGNOSIS — D2371 Other benign neoplasm of skin of right lower limb, including hip: Secondary | ICD-10-CM

## 2021-06-02 DIAGNOSIS — D2372 Other benign neoplasm of skin of left lower limb, including hip: Secondary | ICD-10-CM

## 2021-06-02 NOTE — Progress Notes (Signed)
She presents today chief complaint of painful elongated toenails and calluses.  Objective: Toenails are long thick yellow dystrophic with mycotic painful palpation as well as debridement.  Pulses are palpable.  No open lesions or wounds.  Assessment: Pain limb secondary to onychomycosis.  Plan: Debridement of toenails 1 through 5 bilateral.

## 2021-08-15 ENCOUNTER — Other Ambulatory Visit: Payer: Self-pay | Admitting: Internal Medicine

## 2021-08-15 DIAGNOSIS — I10 Essential (primary) hypertension: Secondary | ICD-10-CM

## 2021-09-22 NOTE — Progress Notes (Deleted)
ANNUAL WELLNESS VISIT AND 3 MONTH FOLLOW UP  Assessment and Plan:   Encounter for Medicare Annual Wellness Visit Due Yearly  Sickle cell trait ([place]) Monitor  Morbid obesity ([place]) - follow up 3 months for progress monitoring - increase veggies, decrease carbs - long discussion about weight loss, diet, and exercise  Anemia due to blood loss Monitor  Abnormal glucose -     Hemoglobin A1c Discussed disease progression and risks Discussed diet/exercise, weight management and risk modification  Hyperlipidemia, mixed -     Lipid panel check lipids decrease fatty foods increase activity.   Essential hypertension - continue medications, DASH diet, exercise and encourage monitoring at home. Call if greater than 130/80.  -     CBC with Differential/Platelet -     COMPLETE METABOLIC PANEL WITH GFR -     TSH -     Urinalysis, Routine w reflex microscopic -     Microalbumin / creatinine urine ratio -     EKG 12-Lead  Vitamin D deficiency -     VITAMIN D 25 Hydroxy (Vit-D Deficiency, Fractures)  Idiopathic gout, unspecified chronicity, unspecified site Continue on allopurinol, no recent flares   Chronic pain of both knees -     Has seen Othopedic but in arrears, had been recommended knee replacement - Continue to use icy hot with lidocaine and Tylenol  Medication management -     Magnesium      Continue diet and meds as discussed. Further disposition pending results of labs. Discussed med's effects and SE's.   Over 30 minutes of exam, counseling, chart review, and critical decision making was performed.   Future Appointments  Date Time Provider [place]  09/23/2021 11:00 AM Magda Bernheim, NP [facility] None  10/01/2021  1:15 PM Tyson Dense T, DPM [facility]  12/03/2021  2:00 PM Magda Bernheim, NP [facility] None    ----------------------------------------------------------------------------------------------------------------------  HPI 66 y.o.  female  presents for complete physical on hypertension, cholesterol, history of prediabetes, morbid obesity (BMI 50+) and vitamin D deficiency.   She has bilateral knee OA, takes tylenol as needed for pain but is not providing relief.  She has been advised she will need a knee replacement but is currently in arrears at Orthopedic office so has not gone back for further treatment .  BMI is There is no height or weight on file to calculate BMI., she has not been working on diet and exercise. . Not currently checking weights, needs new scale. She has lost 11 pounds in the past 5 months but has not made any changes.  Does agree diet and exercise regimen needs to change Wt Readings from Last 3 Encounters:  03/26/21 (!) 344 lb 12.8 oz (156.4 kg)  12/03/20 (!) 338 lb (153.3 kg)  07/17/20 (!) 349 lb (158.3 kg)   She reports she has not checked her BP recently but bought new BP cuff, today their BP is   by provider manual recheck  She does not workout. She denies chest pain, shortness of breath, dizziness.   She is not on cholesterol medication and denies myalgias. Her cholesterol is at goal. The cholesterol last visit was:   Lab Results  Component Value Date   CHOL 193 03/26/2021   HDL 66 03/26/2021   LDLCALC 110 (H) 03/26/2021   TRIG 77 03/26/2021   CHOLHDL 2.9 03/26/2021    She has not been working on diet and exercise for history of prediabetes, and denies increased appetite, nausea, paresthesia  of the feet, polydipsia, polyuria, visual disturbances, vomiting and weight loss. Last A1C in the office was:  Lab Results  Component Value Date   HGBA1C 5.5 03/26/2021   Patient is not regular on Vitamin D supplement and remains very low at last check:   Lab Results  Component Value Date   VD25OH 10 (L) 12/03/2020     Patient is on allopurinol for gout and does not report a recent flare.  Lab Results  Component Value Date   LABURIC 6.0 04/02/2019      Current Medications:    Current  Outpatient Medications (Cardiovascular):    bumetanide (BUMEX) 1 MG tablet, Take 1 tablet (1 mg total) by mouth as needed.   doxazosin (CARDURA) 8 MG tablet, TAKE 1 TABLET AT BEDTIME FOR BLOOD PRESSURE   Current Outpatient Medications (Analgesics):    acetaminophen (TYLENOL) 500 MG tablet, Take 500 mg by mouth every 6 (six) hours as needed.   allopurinol (ZYLOPRIM) 300 MG tablet, TAKE 1 TABLET DAILY TO PREVENT GOUT   aspirin 81 MG tablet, Take 81 mg by mouth daily.   Current Outpatient Medications (Other):    gabapentin (NEURONTIN) 600 MG tablet, TAKE 1/2 TO 1 TABLET 3 X /DAY AS NEEDED FOR CHRONIC PAIN   Allergies:  Allergies  Allergen Reactions   Ace Inhibitors     Cough   Losartan    Paxil [Paroxetine Hcl]     Mood swings   Prednisone     Nausea/ irratated   Vitamin D Analogs     Cramping     Medical History:  Past Medical History:  Diagnosis Date   Anemia    DDD (degenerative disc disease)    DJD (degenerative joint disease)    Elevated hemoglobin A1c    GERD (gastroesophageal reflux disease)    Hyperlipidemia    Hypertension    Obesity    Sickle cell trait ([place])    Immunization History  Administered Date(s) Administered   PFIZER(Purple Top)SARS-COV-2 Vaccination 08/22/2019, 09/12/2019, 06/05/2020   PPD Test 05/02/2013, 06/18/2014, 07/27/2016, 08/22/2017, 09/12/2018   Td 07/01/2015   Health Maintenance  Topic Date Due   Zoster Vaccines- Shingrix (1 of 2) Never done   Pneumonia Vaccine 22+ Years old (1 - PCV) Never done   COVID-19 Vaccine (4 - Booster for Pfizer series) 07/31/2020   INFLUENZA VACCINE  12/22/2021   MAMMOGRAM  04/07/2022   TETANUS/TDAP  06/30/2025   COLONOSCOPY (Pts 45-61yr Insurance coverage will need to be confirmed)  02/26/2030   DEXA SCAN  Completed   Hepatitis C Screening  Completed   HPV VACCINES  Aged Out   Eye Exam- overdue , pt aware she should schedule PAP DR. HENLEY 2020, needs to schedule DEXA 10/21 normal MGM 10/21  negative Colonoscopy 02/2020- no significant pathologic changes, F/U 5 years suggested PREVNAR: WANTS TO WAIT  SURGICAL HISTORY She  has a past surgical history that includes Shoulder surgery (Left, 2007). FAMILY HISTORY Her family history includes Cancer in her father; Diabetes in her brother and sister; Heart disease in her mother; Mental illness in her sister; Multiple myeloma in her father; Obstructive Sleep Apnea in her brother. SOCIAL HISTORY She  reports that she has never smoked. She has never used smokeless tobacco. She reports current alcohol use of about 6.0 standard drinks per week.   Review of Systems:  Review of Systems  Constitutional:  Negative for chills, fever, malaise/fatigue and weight loss.  HENT:  Negative for congestion, ear discharge, hearing loss, sore  throat and tinnitus.   Eyes:  Negative for blurred vision, double vision, discharge and redness.       Floater in right eye, has been several years  Respiratory:  Negative for cough, shortness of breath and wheezing.   Cardiovascular:  Positive for leg swelling (feet bilaterally). Negative for chest pain, palpitations, orthopnea and claudication.  Gastrointestinal:  Positive for diarrhea (if eats something tha upsets stomach). Negative for abdominal pain, blood in stool, constipation, heartburn, melena, nausea and vomiting.  Genitourinary: Negative.  Negative for dysuria, frequency and urgency.  Musculoskeletal:  Positive for joint pain. Negative for back pain (r knee) and falls. Myalgias: right arm. Skin:  Negative for rash.  Neurological:  Negative for dizziness, tingling, sensory change, seizures, weakness and headaches.  Endo/Heme/Allergies:  Negative for polydipsia. Does not bruise/bleed easily.  Psychiatric/Behavioral:  Negative for depression, hallucinations, memory loss and suicidal ideas. The patient has insomnia (wakes up frequently). The patient is not nervous/anxious.   All other systems reviewed and are  negative.  Physical Exam: There were no vitals taken for this visit. Wt Readings from Last 3 Encounters:  03/26/21 (!) 344 lb 12.8 oz (156.4 kg)  12/03/20 (!) 338 lb (153.3 kg)  07/17/20 (!) 349 lb (158.3 kg)   General Appearance: Morbidly obese, in no apparent distress. Eyes: PERRLA, EOMs, conjunctiva no swelling or erythema Sinuses: No Frontal/maxillary tenderness ENT/Mouth: Ext aud canals clear, TMs without erythema, bulging. No erythema, swelling, or exudate on post pharynx.  Tonsils not swollen or erythematous. Hearing normal.  Neck: Supple, thyroid normal.  Respiratory: Respiratory effort normal, BS equal bilaterally without rales, rhonchi, wheezing or stridor.  Cardio: Distant heart sounds due to body habitus; RRR with no audible MRGs. Brisk peripheral pulses with some 1+ pitting edema to bilateral ankles.  Abdomen: Soft, obese abdomen, + BS.  Non tender, no guarding, rebound, hernias, masses. Lymphatics: Non tender without lymphadenopathy.  Musculoskeletal: Full ROM, 5/5 strength, Normal gait Skin: Warm, dry without rashes, lesions, ecchymosis.  Neuro: Cranial nerves intact. No cerebellar symptoms.  Psych: Awake and oriented X 3, normal affect, Insight and Judgment appropriate.  EKG- normal  Josselyn Harkins Kathyrn Drown, NP 9:10 AM [facility]

## 2021-09-23 ENCOUNTER — Ambulatory Visit: Payer: 59 | Admitting: Nurse Practitioner

## 2021-10-01 ENCOUNTER — Encounter: Payer: Self-pay | Admitting: Podiatry

## 2021-10-01 ENCOUNTER — Ambulatory Visit: Payer: 59 | Admitting: Podiatry

## 2021-10-01 DIAGNOSIS — B351 Tinea unguium: Secondary | ICD-10-CM

## 2021-10-01 DIAGNOSIS — D2371 Other benign neoplasm of skin of right lower limb, including hip: Secondary | ICD-10-CM

## 2021-10-01 DIAGNOSIS — D2372 Other benign neoplasm of skin of left lower limb, including hip: Secondary | ICD-10-CM

## 2021-10-01 DIAGNOSIS — M79676 Pain in unspecified toe(s): Secondary | ICD-10-CM | POA: Diagnosis not present

## 2021-10-04 NOTE — Progress Notes (Signed)
She presents today chief complaint of painful elongated toenails and multiple calluses. ? ?Objective: Pulses are palpable.  No open lesions or wounds toenails are long thick yellow dystrophic onychomycotic painful palpation as well as debridement. ? ?Assessment: Pain in limb secondary to onychomycosis and benign skin lesions. ? ?Plan: Debrided benign skin lesions debrided toenails 1 through 5 bilateral.  Follow-up with her in about 3 months. ?

## 2021-10-14 ENCOUNTER — Ambulatory Visit: Payer: 59 | Admitting: Nurse Practitioner

## 2021-10-21 NOTE — Progress Notes (Unsigned)
FOLLOW UP 6 MONTH  Assessment and Plan:   Alicia Diaz was seen today for follow-up.  Diagnoses and all orders for this visit:  Essential hypertension       Using Bumex 1mg PRN, Cardura 8mg QHS Monitor blood pressure at home; call if consistently over 130/80 Continue DASH diet.   Reminder to go to the ER if any CP, SOB, nausea, dizziness, severe HA, changes vision/speech, left arm numbness and tingling and jaw pain. -     CBC with Differential/Platelet -     COMPLETE METABOLIC PANEL WITH GFR - TSH  Sickle cell trait (HCC) Monitor  Morbid obesity with BMI of 50.0-59.9, adult (HCC) Discussed dietary and exercise modifications Will begin Rybelsus and try to focus on eating every 6 hours while awake and do not skip meals -     CBC with Differential/Platelet -     COMPLETE METABOLIC PANEL WITH GFR -     Hemoglobin A1c - TSH  Abnormal glucose Discussed dietary and exercise modifications Begin Rybelsus 3 mg 1 tab daily -     Hemoglobin A1c  Hyperlipidemia, mixed Not on medications at this time Discussed dietary and exercise modifications Low fat diet -     Lipid panel   Vitamin D deficiency Continue supplementation to maintain goal of 70-100 Not taking supplementation at this time Defer vitamin D level   Idiopathic gout, unspecified chronicity, unspecified site Continue allopurinol 300mg daily No recent flares Discussed dietary modifications Continue to monitor  Chronic pain of both knees Strongly encouraged weight loss Unable to take NSAIDs due to ulcerations on ileocecal valve on colonoscopy, continue Tylenol PRN Continues gabapentin  Medication management Continued    Continue diet and meds as discussed. Further disposition pending results of labs. Discussed med's effects and SE's.   Over 30 minutes of face to face interview, exam, counseling, chart review, and critical decision making was performed.   Future Appointments  Date Time Provider Department Center   12/03/2021  2:00 PM Mull, Dana W, NP [facility] None  01/05/2022  1:15 PM Hyatt, Max T, DPM [facility]    ----------------------------------------------------------------------------------------------------------------------  HPI 66 y.o. female  presents for 3 month follow up on HTN, HLD, history of prediabetes, morbid obesity (BMI 50+) and vitamin D deficiency.     She has bilateral knee OA, last saw Dr. Rowan in 2015, states her right knee has been bothering her more. She is taking Tylenol arthritis. Had steroid injections in the past with minimal relief. Pain persists. She continue to have pain in her shoulders from her bra. She does have pendulous breasts  She recently had a colonoscopy 09/2019 showed multiple ulcers of ileocecal valve most likely related to NSAID use so advised to avoid.    BMI is Body mass index is 52.63 kg/m., she has not been working on diet and exercise. Admits she will commonly not eat all day then eat one very large meal at the end of the day.  She is in agreement she needs to make changes. Not currently checking weights, needs new scale.  She has used Phentermine in the past but was unable to tolerate. She is very afraid of needles so does not wish to use Saxenda.  Has not been watching diet and exercise has been limited Wt Readings from Last 3 Encounters:  10/22/21 (!) 336 lb (152.4 kg)  03/26/21 (!) 344 lb 12.8 oz (156.4 kg)  12/03/20 (!) 338 lb (153.3 kg)   She reports she has not checked her BP   recently but bought new BP cuff, today their BP is BP: 119/71  BP Readings from Last 3 Encounters:  10/22/21 119/71  03/26/21 138/90  12/03/20 (!) 150/82    She does not workout. She denies chest pain, shortness of breath, dizziness.     She is not on cholesterol medication and denies myalgias. Her cholesterol is at goal. The cholesterol last visit was:   Lab Results  Component Value Date   CHOL 193 03/26/2021   HDL 66 03/26/2021   LDLCALC 110 (H)  03/26/2021   TRIG 77 03/26/2021   CHOLHDL 2.9 03/26/2021    She has not been working on diet and exercise for history of prediabetes, and denies increased appetite, nausea, paresthesia of the feet, polydipsia, polyuria, visual disturbances, vomiting and weight loss. Last A1C in the office was:  Lab Results  Component Value Date   HGBA1C 5.5 03/26/2021   Patient is not regular on Vitamin D supplement and remains very low at last check:   Lab Results  Component Value Date   VD25OH 10 (L) 12/03/2020     Patient is on allopurinol for gout and does not report a recent flare.  Lab Results  Component Value Date   LABURIC 6.0 04/02/2019      Current Medications:    Current Outpatient Medications (Cardiovascular):    bumetanide (BUMEX) 1 MG tablet, Take 1 tablet (1 mg total) by mouth as needed.   doxazosin (CARDURA) 8 MG tablet, TAKE 1 TABLET AT BEDTIME FOR BLOOD PRESSURE   Current Outpatient Medications (Analgesics):    acetaminophen (TYLENOL) 500 MG tablet, Take 500 mg by mouth every 6 (six) hours as needed.   allopurinol (ZYLOPRIM) 300 MG tablet, TAKE 1 TABLET DAILY TO PREVENT GOUT   aspirin 81 MG tablet, Take 81 mg by mouth daily.   Current Outpatient Medications (Other):    gabapentin (NEURONTIN) 600 MG tablet, TAKE 1/2 TO 1 TABLET 3 X /DAY AS NEEDED FOR CHRONIC PAIN   Allergies:  Allergies  Allergen Reactions   Ace Inhibitors     Cough   Losartan    Paxil [Paroxetine Hcl]     Mood swings   Prednisone     Nausea/ irratated   Vitamin D Analogs     Cramping     Medical History:  Past Medical History:  Diagnosis Date   Anemia    DDD (degenerative disc disease)    DJD (degenerative joint disease)    Elevated hemoglobin A1c    GERD (gastroesophageal reflux disease)    Hyperlipidemia    Hypertension    Obesity    Sickle cell trait ([place])    Immunization History  Administered Date(s) Administered   PFIZER(Purple Top)SARS-COV-2 Vaccination 08/22/2019,  09/12/2019, 06/05/2020   PPD Test 05/02/2013, 06/18/2014, 07/27/2016, 08/22/2017, 09/12/2018   Td 07/01/2015   Health Maintenance  Topic Date Due   Zoster Vaccines- Shingrix (1 of 2) Never done   Pneumonia Vaccine 90+ Years old (1 - PCV) Never done   COVID-19 Vaccine (4 - Booster for [place] series) 07/31/2020   INFLUENZA VACCINE  12/22/2021   MAMMOGRAM  04/07/2022   TETANUS/TDAP  06/30/2025   COLONOSCOPY (Pts 45-31yr Insurance coverage will need to be confirmed)  02/26/2030   DEXA SCAN  Completed   Hepatitis C Screening  Completed   HPV VACCINES  Aged Out   PAP: DR. HUlanda Edison2020 DEXA 02/2020 MGM 02/2020 Colonoscopy 02/2020 PREVNAR: Discussed with patient, DUE  SURGICAL HISTORY She  has a past surgical history  that includes Shoulder surgery (Left, 2007). FAMILY HISTORY Her family history includes Cancer in her father; Diabetes in her brother and sister; Heart disease in her mother; Mental illness in her sister; Multiple myeloma in her father; Obstructive Sleep Apnea in her brother. SOCIAL HISTORY She  reports that she has never smoked. She has never used smokeless tobacco. She reports current alcohol use of about 6.0 standard drinks per week.   Review of Systems:  Review of Systems  Constitutional:  Negative for chills, fever, malaise/fatigue and weight loss.  HENT:  Negative for congestion, hearing loss and tinnitus.   Eyes:  Negative for blurred vision and double vision.  Respiratory:  Negative for cough, shortness of breath and wheezing.   Cardiovascular:  Negative for chest pain, palpitations, orthopnea, claudication and leg swelling.  Gastrointestinal:  Negative for abdominal pain, blood in stool, constipation, diarrhea, heartburn, melena, nausea and vomiting.  Genitourinary: Negative.   Musculoskeletal:  Positive for joint pain. Negative for falls and myalgias.  Skin:  Negative for rash.  Neurological:  Negative for dizziness, tingling, tremors, sensory change, loss of  consciousness, weakness and headaches.  Endo/Heme/Allergies:  Negative for polydipsia.  Psychiatric/Behavioral: Negative.  Negative for depression, memory loss and suicidal ideas. The patient is not nervous/anxious and does not have insomnia.   All other systems reviewed and are negative.  Physical Exam: BP 119/71   Pulse 95   Temp (!) 97.5 F (36.4 C)   Wt (!) 336 lb (152.4 kg)   SpO2 94%   BMI 52.63 kg/m  Wt Readings from Last 3 Encounters:  10/22/21 (!) 336 lb (152.4 kg)  03/26/21 (!) 344 lb 12.8 oz (156.4 kg)  12/03/20 (!) 338 lb (153.3 kg)   General Appearance: Morbidly obese, pleasant female in no apparent distress. Eyes: PERRLA, EOMs, conjunctiva no swelling or erythema Sinuses: No Frontal/maxillary tenderness ENT/Mouth: Ext aud canals clear, TMs without erythema, bulging. No erythema, swelling, or exudate on post pharynx.  Tonsils not swollen or erythematous. Hearing normal.  Neck: Supple, thyroid normal.  Respiratory: Respiratory effort normal, BS equal bilaterally without rales, rhonchi, wheezing or stridor.  Cardio: Distant heart sounds due to body habitus; RRR with no audible MRGs. Brisk peripheral pulses with some non-pitting edema to bilateral ankles.  Abdomen: Soft, obese abdomen, + BS.  Non tender, no guarding, rebound, hernias, masses. Lymphatics: Non tender without lymphadenopathy.  Musculoskeletal: Full ROM, 5/5 strength, antalgic gait- favors right knee Skin: Warm, dry without rashes, lesions, ecchymosis.  Neuro: Cranial nerves intact. No cerebellar symptoms.  Psych: Awake and oriented X 3, normal affect, Insight and Judgment appropriate.    DANA W MULL, NP 2:53 PM [facility]  

## 2021-10-22 ENCOUNTER — Encounter: Payer: Self-pay | Admitting: Nurse Practitioner

## 2021-10-22 ENCOUNTER — Ambulatory Visit: Payer: 59 | Admitting: Nurse Practitioner

## 2021-10-22 ENCOUNTER — Other Ambulatory Visit: Payer: Self-pay | Admitting: Nurse Practitioner

## 2021-10-22 VITALS — BP 119/71 | HR 95 | Temp 97.5°F | Wt 336.0 lb

## 2021-10-22 DIAGNOSIS — I10 Essential (primary) hypertension: Secondary | ICD-10-CM

## 2021-10-22 DIAGNOSIS — D573 Sickle-cell trait: Secondary | ICD-10-CM

## 2021-10-22 DIAGNOSIS — R7309 Other abnormal glucose: Secondary | ICD-10-CM | POA: Diagnosis not present

## 2021-10-22 DIAGNOSIS — M25562 Pain in left knee: Secondary | ICD-10-CM

## 2021-10-22 DIAGNOSIS — Z79899 Other long term (current) drug therapy: Secondary | ICD-10-CM

## 2021-10-22 DIAGNOSIS — M25561 Pain in right knee: Secondary | ICD-10-CM

## 2021-10-22 DIAGNOSIS — E559 Vitamin D deficiency, unspecified: Secondary | ICD-10-CM

## 2021-10-22 DIAGNOSIS — M109 Gout, unspecified: Secondary | ICD-10-CM

## 2021-10-22 DIAGNOSIS — E782 Mixed hyperlipidemia: Secondary | ICD-10-CM

## 2021-10-22 DIAGNOSIS — G8929 Other chronic pain: Secondary | ICD-10-CM

## 2021-10-22 MED ORDER — RYBELSUS 3 MG PO TABS: 3.0000 mg | ORAL_TABLET | Freq: Every day | ORAL | 3 refills | Status: DC

## 2021-10-22 NOTE — Patient Instructions (Signed)
Semaglutide Tablets What is this medication? SEMAGLUTIDE (SEM a GLOO tide) treats type 2 diabetes. It works by increasing insulin levels in your body, which decreases your blood sugar (glucose). It also reduces the amount of sugar released into the blood and slows down your digestion. Changes to diet and exercise are often combined with this medication. This medicine may be used for other purposes; ask your health care provider or pharmacist if you have questions. COMMON BRAND NAME(S): Rybelsus What should I tell my care team before I take this medication? They need to know if you have any of these conditions: Endocrine tumors (MEN 2) or if someone in your family had these tumors Eye disease History of pancreatitis Kidney disease Stomach or intestine problems Thyroid cancer or if someone in your family had thyroid cancer Vision problems An unusual or allergic reaction to semaglutide, other medications, foods, dyes, or preservatives Pregnant or trying to get pregnant Breast-feeding How should I use this medication? Take this medication by mouth. Take it as directed on the prescription label at the same time every day. Take the dose right after waking up. Do not eat or drink anything before taking it. Do not take it with any other drink except a glass of plain water that is less than 4 ounces (less than 120 mL). Do not cut, crush or chew this medication. Swallow the tablets whole. After taking it, do not eat breakfast, drink, or take any other medications or vitamins for at least 30 minutes. Keep taking it unless your care team tells you to stop. A special MedGuide will be given to you by the pharmacist with each prescription and refill. Be sure to read this information carefully each time. Talk to your care team about the use of this medication in children. Special care may be needed. Overdosage: If you think you have taken too much of this medicine contact a poison control center or emergency  room at once. NOTE: This medicine is only for you. Do not share this medicine with others. What if I miss a dose? If you miss a dose, skip it. Take your next dose at the normal time. Do not take extra or 2 doses at the same time to make up for the missed dose. What may interact with this medication? What may interact with this medication? Aminophylline Carbamazepine Cyclosporine Digoxin Levothyroxine Other medications for diabetes Phenytoin Tacrolimus Theophylline Warfarin Many medications may cause changes in blood sugar, these include: Alcohol containing beverages Antiviral medications for HIV or AIDS Aspirin and aspirin-like medications Certain medications for blood pressure, heart disease, irregular heart beat Chromium Diuretics Female hormones, such as estrogens or progestins, birth control pills Fenofibrate Gemfibrozil Isoniazid Lanreotide Female hormones or anabolic steroids MAOIs like Carbex, Eldepryl, Marplan, Nardil, and Parnate Medications for weight loss Medications for allergies, asthma, cold, or cough Medications for depression, anxiety, or psychotic disturbances Niacin Nicotine NSAIDs, medications for pain and inflammation, like ibuprofen or naproxen Octreotide Pasireotide Pentamidine Phenytoin Probenecid Quinolone antibiotics such as ciprofloxacin, levofloxacin, ofloxacin Some herbal dietary supplements Steroid medications such as prednisone or cortisone Sulfamethoxazole; trimethoprim Thyroid hormones Some medications can hide the warning symptoms of low blood sugar (hypoglycemia). You may need to monitor your blood sugar more closely if you are taking one of these medications. These include: Beta-blockers, often used for high blood pressure or heart problems (examples include atenolol, metoprolol, propranolol) Clonidine Guanethidine Reserpine This list may not describe all possible interactions. Give your health care provider a list of all the  medicines, herbs, non-prescription drugs, or dietary supplements you use. Also tell them if you smoke, drink alcohol, or use illegal drugs. Some items may interact with your medicine. What should I watch for while using this medication? Visit your care team for regular checks on your progress. Check with your care team if you have severe diarrhea, nausea, and vomiting, or if you sweat a lot. The loss of too much body fluid may make it dangerous for you to take this medication. A test called the HbA1C (A1C) will be monitored. This is a simple blood test. It measures your blood sugar control over the last 2 to 3 months. You will receive this test every 3 to 6 months. Learn how to check your blood sugar. Learn the symptoms of low and high blood sugar and how to manage them. Always carry a quick-source of sugar with you in case you have symptoms of low blood sugar. Examples include hard sugar candy or glucose tablets. Make sure others know that you can choke if you eat or drink when you develop serious symptoms of low blood sugar, such as seizures or unconsciousness. Get medical help at once. Tell your care team if you have high blood sugar. You might need to change the dose of your medication. If you are sick or exercising more than usual, you might need to change the dose of your medication. Do not skip meals. Ask your care team if you should avoid alcohol. Many nonprescription cough and cold products contain sugar or alcohol. These can affect blood sugar. Wear a medical ID bracelet or chain. Carry a card that describes your condition. List the medications and doses you take on the card. Do not become pregnant while taking this medication. Women should inform their care team if they wish to become pregnant or think they might be pregnant. There is a potential for serious side effects to an unborn child. Talk to your care team for more information. Do not breast-feed an infant while taking this  medication. What side effects may I notice from receiving this medication? Side effects that you should report to your care team as soon as possible: Allergic reactions--skin rash, itching, hives, swelling of the face, lips, tongue, or throat Change in vision Dehydration--increased thirst, dry mouth, feeling faint or lightheaded, headache, dark yellow or brown urine Gallbladder problems--severe stomach pain, nausea, vomiting, fever Heart palpitations--rapid, pounding, or irregular heartbeat Kidney injury--decrease in the amount of urine, swelling of the ankles, hands, or feet Pancreatitis--severe stomach pain that spreads to your back or gets worse after eating or when touched, fever, nausea, vomiting Thyroid cancer--new mass or lump in the neck, pain or trouble swallowing, trouble breathing, hoarseness Side effects that usually do not require medical attention (report to your care team if they continue or are bothersome): Diarrhea Loss of appetite Nausea Stomach pain Vomiting This list may not describe all possible side effects. Call your doctor for medical advice about side effects. You may report side effects to FDA at 1-800-FDA-1088. Where should I keep my medication? Keep out of the reach of children and pets. Store at room temperature between 20 and 25 degrees C (68 and 77 degrees F). Keep this medication in the original container. Protect from moisture. Keep the container tightly closed. Get rid of any unused medication after the expiration date. To get rid of medications that are no longer needed or have expired: Take the medication to a medication take-back program. Check with your pharmacy or law enforcement to  find a location. If you cannot return the medication, check the label or package insert to see if the medication should be thrown out in the garbage or flushed down the toilet. If you are not sure, ask your care team. If it is safe to put it in the trash, take the medication  out of the container. Mix the medication with cat litter, dirt, coffee grounds, or other unwanted substance. Seal the mixture in a bag or container. Put it in the trash. NOTE: This sheet is a summary. It may not cover all possible information. If you have questions about this medicine, talk to your doctor, pharmacist, or health care provider.  2023 Elsevier/Gold Standard (2020-09-17 00:00:00)  

## 2021-10-22 NOTE — Telephone Encounter (Signed)
Requires prior auth

## 2021-10-23 LAB — LIPID PANEL
Cholesterol: 177 mg/dL (ref <200)
HDL: 59 mg/dL (ref >=50)
LDL Cholesterol (Calc): 101 mg/dL (calc) — ABNORMAL HIGH
Non-HDL Cholesterol (Calc): 118 mg/dL (calc) (ref <130)
Total CHOL/HDL Ratio: 3 (calc) (ref <5.0)
Triglycerides: 80 mg/dL (ref <150)

## 2021-10-23 LAB — CBC WITH DIFFERENTIAL/PLATELET
Absolute Monocytes: 491 cells/uL (ref 200–950)
Basophils Absolute: 38 cells/uL (ref 0–200)
Basophils Relative: 0.6 %
Eosinophils Absolute: 120 cells/uL (ref 15–500)
Eosinophils Relative: 1.9 %
HCT: 37.2 % (ref 35.0–45.0)
Hemoglobin: 12.2 g/dL (ref 11.7–15.5)
Lymphs Abs: 2255 cells/uL (ref 850–3900)
MCH: 28.6 pg (ref 27.0–33.0)
MCHC: 32.8 g/dL (ref 32.0–36.0)
MCV: 87.1 fL (ref 80.0–100.0)
MPV: 10.4 fL (ref 7.5–12.5)
Monocytes Relative: 7.8 %
Neutro Abs: 3396 cells/uL (ref 1500–7800)
Neutrophils Relative %: 53.9 %
Platelets: 210 10*3/uL (ref 140–400)
RBC: 4.27 10*6/uL (ref 3.80–5.10)
RDW: 13.2 % (ref 11.0–15.0)
Total Lymphocyte: 35.8 %
WBC: 6.3 10*3/uL (ref 3.8–10.8)

## 2021-10-23 LAB — COMPLETE METABOLIC PANEL WITH GFR
AG Ratio: 1.3 (calc) (ref 1.0–2.5)
ALT: 11 U/L (ref 6–29)
AST: 14 U/L (ref 10–35)
Albumin: 4 g/dL (ref 3.6–5.1)
Alkaline phosphatase (APISO): 117 U/L (ref 37–153)
BUN: 17 mg/dL (ref 7–25)
CO2: 22 mmol/L (ref 20–32)
Calcium: 9.4 mg/dL (ref 8.6–10.4)
Chloride: 102 mmol/L (ref 98–110)
Creat: 0.89 mg/dL (ref 0.50–1.05)
Globulin: 3.2 g/dL (calc) (ref 1.9–3.7)
Glucose, Bld: 168 mg/dL — ABNORMAL HIGH (ref 65–99)
Potassium: 4.1 mmol/L (ref 3.5–5.3)
Sodium: 137 mmol/L (ref 135–146)
Total Bilirubin: 1 mg/dL (ref 0.2–1.2)
Total Protein: 7.2 g/dL (ref 6.1–8.1)
eGFR: 71 mL/min/{1.73_m2} (ref >=60)

## 2021-10-23 LAB — HEMOGLOBIN A1C
Hgb A1c MFr Bld: 5.5 % of total Hgb (ref <5.7)
Mean Plasma Glucose: 111 mg/dL
eAG (mmol/L): 6.2 mmol/L

## 2021-10-23 LAB — TSH: TSH: 2.48 mIU/L (ref 0.40–4.50)

## 2021-10-23 NOTE — Progress Notes (Signed)
Patient is aware of lab results and instructions. -e welch

## 2021-10-27 ENCOUNTER — Telehealth: Payer: Self-pay

## 2021-10-27 NOTE — Telephone Encounter (Signed)
Prior auth for Rybelsus denied. Patient aware.

## 2021-11-19 ENCOUNTER — Encounter: Payer: 59 | Admitting: Internal Medicine

## 2021-12-03 ENCOUNTER — Encounter: Payer: 59 | Admitting: Nurse Practitioner

## 2022-01-05 ENCOUNTER — Ambulatory Visit: Payer: 59 | Admitting: Podiatry

## 2022-01-05 ENCOUNTER — Encounter: Payer: Self-pay | Admitting: Podiatry

## 2022-01-05 DIAGNOSIS — D2371 Other benign neoplasm of skin of right lower limb, including hip: Secondary | ICD-10-CM

## 2022-01-05 DIAGNOSIS — M79676 Pain in unspecified toe(s): Secondary | ICD-10-CM

## 2022-01-05 DIAGNOSIS — B351 Tinea unguium: Secondary | ICD-10-CM

## 2022-01-05 DIAGNOSIS — D2372 Other benign neoplasm of skin of left lower limb, including hip: Secondary | ICD-10-CM

## 2022-01-05 NOTE — Progress Notes (Signed)
She presents today chief complaint of painful elongated toenails and calluses bilateral.  Objective: Pulses are palpable.  There is no erythema edema salines drainage odor toenails are long thick yellow dystrophic onychomycotic.  No open lesions or wounds are noted.  Assessment: Debrided toenails 1 through 5 bilateral covered service secondary to pain follow-up with her in the near future.

## 2022-01-27 NOTE — Progress Notes (Deleted)
COMPLETE PHYSICAL  Assessment and Plan:   Encounter for general adult medical examination with abnormal findings Needs Prevnar- declines   Sickle cell trait ([place]) Monitor  Morbid obesity ([place]) - follow up 3 months for progress monitoring - increase veggies, decrease carbs - long discussion about weight loss, diet, and exercise  Anemia due to blood loss Monitor  Abnormal glucose -     Hemoglobin A1c Discussed disease progression and risks Discussed diet/exercise, weight management and risk modification  Hyperlipidemia, mixed -     Lipid panel check lipids decrease fatty foods increase activity.   Essential hypertension - continue medications, DASH diet, exercise and encourage monitoring at home. Call if greater than 130/80.  -     CBC with Differential/Platelet -     COMPLETE METABOLIC PANEL WITH GFR -     TSH -     Urinalysis, Routine w reflex microscopic -     Microalbumin / creatinine urine ratio -     EKG 12-Lead  Vitamin D deficiency -     VITAMIN D 25 Hydroxy (Vit-D Deficiency, Fractures)  Idiopathic gout, unspecified chronicity, unspecified site Continue on allopurinol, no recent flares   Chronic pain of both knees -     Has seen Othopedic but in arrears, had been recommended knee replacement - Continue to use icy hot with lidocaine and Tylenol  Medication management -     Magnesium      Continue diet and meds as discussed. Further disposition pending results of labs. Discussed med's effects and SE's.   Over 30 minutes of exam, counseling, chart review, and critical decision making was performed.   Future Appointments  Date Time Provider [place]  01/28/2022  3:00 PM Alycia Rossetti, NP [facility] None  05/06/2022  1:45 PM Tyson Dense T, DPM [facility]  02/01/2023  3:00 PM Alycia Rossetti, NP [facility] None     ----------------------------------------------------------------------------------------------------------------------  HPI 66 y.o. female  presents for complete physical on hypertension, cholesterol, history of prediabetes, morbid obesity (BMI 50+) and vitamin D deficiency.   She has bilateral knee OA, takes tylenol as needed for pain but is not providing relief.  She has been advised she will need a knee replacement but is currently in arrears at Orthopedic office so has not gone back for further treatment .  BMI is There is no height or weight on file to calculate BMI., she has not been working on diet and exercise. . Not currently checking weights, needs new scale. She has lost 11 pounds in the past 5 months but has not made any changes.  Does agree diet and exercise regimen needs to change Wt Readings from Last 3 Encounters:  10/22/21 (!) 336 lb (152.4 kg)  03/26/21 (!) 344 lb 12.8 oz (156.4 kg)  12/03/20 (!) 338 lb (153.3 kg)   She reports she has not checked her BP recently but bought new BP cuff, today their BP is   by provider manual recheck  She does not workout. She denies chest pain, shortness of breath, dizziness.   She is not on cholesterol medication and denies myalgias. Her cholesterol is at goal. The cholesterol last visit was:   Lab Results  Component Value Date   CHOL 177 10/22/2021   HDL 59 10/22/2021   LDLCALC 101 (H) 10/22/2021   TRIG 80 10/22/2021   CHOLHDL 3.0 10/22/2021    She has not been working on diet and exercise for history of prediabetes, and denies increased appetite, nausea, paresthesia  of the feet, polydipsia, polyuria, visual disturbances, vomiting and weight loss. Last A1C in the office was:  Lab Results  Component Value Date   HGBA1C 5.5 10/22/2021   Patient is not regular on Vitamin D supplement and remains very low at last check:   Lab Results  Component Value Date   VD25OH 10 (L) 12/03/2020     Patient is on allopurinol for gout and  does not report a recent flare.  Lab Results  Component Value Date   LABURIC 6.0 04/02/2019      Current Medications:    Current Outpatient Medications (Cardiovascular):    bumetanide (BUMEX) 1 MG tablet, Take 1 tablet (1 mg total) by mouth as needed.   doxazosin (CARDURA) 8 MG tablet, TAKE 1 TABLET AT BEDTIME FOR BLOOD PRESSURE   Current Outpatient Medications (Analgesics):    acetaminophen (TYLENOL) 500 MG tablet, Take 500 mg by mouth every 6 (six) hours as needed.   allopurinol (ZYLOPRIM) 300 MG tablet, TAKE 1 TABLET DAILY TO PREVENT GOUT   aspirin 81 MG tablet, Take 81 mg by mouth daily.   Current Outpatient Medications (Other):    gabapentin (NEURONTIN) 600 MG tablet, TAKE 1/2 TO 1 TABLET 3 X /DAY AS NEEDED FOR CHRONIC PAIN   Allergies:  Allergies  Allergen Reactions   Ace Inhibitors     Cough   Losartan    Paxil [Paroxetine Hcl]     Mood swings   Prednisone     Nausea/ irratated   Vitamin D Analogs     Cramping     Medical History:  Past Medical History:  Diagnosis Date   Anemia    DDD (degenerative disc disease)    DJD (degenerative joint disease)    Elevated hemoglobin A1c    GERD (gastroesophageal reflux disease)    Hyperlipidemia    Hypertension    Obesity    Sickle cell trait ([place])    Immunization History  Administered Date(s) Administered   PFIZER(Purple Top)SARS-COV-2 Vaccination 08/22/2019, 09/12/2019, 06/05/2020   PPD Test 05/02/2013, 06/18/2014, 07/27/2016, 08/22/2017, 09/12/2018   Td 07/01/2015   Health Maintenance  Topic Date Due   Zoster Vaccines- Shingrix (1 of 2) Never done   Pneumonia Vaccine 46+ Years old (1 - PCV) Never done   COVID-19 Vaccine (4 - Pfizer risk series) 07/31/2020   INFLUENZA VACCINE  Never done   MAMMOGRAM  04/07/2022   TETANUS/TDAP  06/30/2025   COLONOSCOPY (Pts 45-92yr Insurance coverage will need to be confirmed)  02/26/2030   DEXA SCAN  Completed   Hepatitis C Screening  Completed   HPV VACCINES  Aged  Out   Eye Exam- overdue , pt aware she should schedule PAP DR. HENLEY 2020, needs to schedule DEXA 10/21 normal MGM 10/21 negative Colonoscopy 02/2020- no significant pathologic changes, F/U 5 years suggested PREVNAR: WANTS TO WAIT  SURGICAL HISTORY She  has a past surgical history that includes Shoulder surgery (Left, 2007). FAMILY HISTORY Her family history includes Cancer in her father; Diabetes in her brother and sister; Heart disease in her mother; Mental illness in her sister; Multiple myeloma in her father; Obstructive Sleep Apnea in her brother. SOCIAL HISTORY She  reports that she has never smoked. She has never used smokeless tobacco. She reports current alcohol use of about 6.0 standard drinks of alcohol per week.   Review of Systems:  Review of Systems  Constitutional:  Negative for chills, fever, malaise/fatigue and weight loss.  HENT:  Negative for congestion, ear discharge, hearing  loss, sore throat and tinnitus.   Eyes:  Negative for blurred vision, double vision, discharge and redness.       Floater in right eye, has been several years  Respiratory:  Negative for cough, shortness of breath and wheezing.   Cardiovascular:  Positive for leg swelling (feet bilaterally). Negative for chest pain, palpitations, orthopnea and claudication.  Gastrointestinal:  Positive for diarrhea (if eats something tha upsets stomach). Negative for abdominal pain, blood in stool, constipation, heartburn, melena, nausea and vomiting.  Genitourinary: Negative.  Negative for dysuria, frequency and urgency.  Musculoskeletal:  Positive for joint pain. Negative for back pain (r knee) and falls. Myalgias: right arm. Skin:  Negative for rash.  Neurological:  Negative for dizziness, tingling, sensory change, seizures, weakness and headaches.  Endo/Heme/Allergies:  Negative for polydipsia. Does not bruise/bleed easily.  Psychiatric/Behavioral:  Negative for depression, hallucinations, memory loss and  suicidal ideas. The patient has insomnia (wakes up frequently). The patient is not nervous/anxious.   All other systems reviewed and are negative.   Physical Exam: There were no vitals taken for this visit. Wt Readings from Last 3 Encounters:  10/22/21 (!) 336 lb (152.4 kg)  03/26/21 (!) 344 lb 12.8 oz (156.4 kg)  12/03/20 (!) 338 lb (153.3 kg)   General Appearance: Morbidly obese, in no apparent distress. Eyes: PERRLA, EOMs, conjunctiva no swelling or erythema Sinuses: No Frontal/maxillary tenderness ENT/Mouth: Ext aud canals clear, TMs without erythema, bulging. No erythema, swelling, or exudate on post pharynx.  Tonsils not swollen or erythematous. Hearing normal.  Neck: Supple, thyroid normal.  Respiratory: Respiratory effort normal, BS equal bilaterally without rales, rhonchi, wheezing or stridor.  Cardio: Distant heart sounds due to body habitus; RRR with no audible MRGs. Brisk peripheral pulses with some 1+ pitting edema to bilateral ankles.  Abdomen: Soft, obese abdomen, + BS.  Non tender, no guarding, rebound, hernias, masses. Lymphatics: Non tender without lymphadenopathy.  Musculoskeletal: Full ROM, 5/5 strength, Normal gait Skin: Warm, dry without rashes, lesions, ecchymosis.  Neuro: Cranial nerves intact. No cerebellar symptoms.  Psych: Awake and oriented X 3, normal affect, Insight and Judgment appropriate.  EKG- normal  Zyaira Vejar Mikki Santee, NP 10:27 AM [facility]

## 2022-01-28 ENCOUNTER — Encounter: Payer: 59 | Admitting: Nurse Practitioner

## 2022-01-28 DIAGNOSIS — Z136 Encounter for screening for cardiovascular disorders: Secondary | ICD-10-CM

## 2022-01-28 DIAGNOSIS — E559 Vitamin D deficiency, unspecified: Secondary | ICD-10-CM

## 2022-01-28 DIAGNOSIS — E782 Mixed hyperlipidemia: Secondary | ICD-10-CM

## 2022-01-28 DIAGNOSIS — M109 Gout, unspecified: Secondary | ICD-10-CM

## 2022-01-28 DIAGNOSIS — Z1389 Encounter for screening for other disorder: Secondary | ICD-10-CM

## 2022-01-28 DIAGNOSIS — I10 Essential (primary) hypertension: Secondary | ICD-10-CM

## 2022-01-28 DIAGNOSIS — Z79899 Other long term (current) drug therapy: Secondary | ICD-10-CM

## 2022-01-28 DIAGNOSIS — D573 Sickle-cell trait: Secondary | ICD-10-CM

## 2022-01-28 DIAGNOSIS — R7309 Other abnormal glucose: Secondary | ICD-10-CM

## 2022-01-28 DIAGNOSIS — G8929 Other chronic pain: Secondary | ICD-10-CM

## 2022-01-28 DIAGNOSIS — Z0001 Encounter for general adult medical examination with abnormal findings: Secondary | ICD-10-CM

## 2022-01-28 DIAGNOSIS — Z1329 Encounter for screening for other suspected endocrine disorder: Secondary | ICD-10-CM

## 2022-02-05 ENCOUNTER — Other Ambulatory Visit: Payer: Self-pay | Admitting: Internal Medicine

## 2022-02-08 NOTE — Progress Notes (Deleted)
COMPLETE PHYSICAL  Assessment and Plan:   Encounter for general adult medical examination with abnormal findings Needs Prevnar- declines   Sickle cell trait ([place]) Monitor  Morbid obesity ([place]) - follow up 3 months for progress monitoring - increase veggies, decrease carbs - long discussion about weight loss, diet, and exercise  Anemia due to blood loss Monitor  Abnormal glucose -     Hemoglobin A1c Discussed disease progression and risks Discussed diet/exercise, weight management and risk modification  Hyperlipidemia, mixed -     Lipid panel check lipids decrease fatty foods increase activity.   Essential hypertension - continue medications, DASH diet, exercise and encourage monitoring at home. Call if greater than 130/80.  -     CBC with Differential/Platelet -     COMPLETE METABOLIC PANEL WITH GFR -     TSH -     Urinalysis, Routine w reflex microscopic -     Microalbumin / creatinine urine ratio -     EKG 12-Lead  Vitamin D deficiency -     VITAMIN D 25 Hydroxy (Vit-D Deficiency, Fractures)  Idiopathic gout, unspecified chronicity, unspecified site Continue on allopurinol, no recent flares   Chronic pain of both knees -     Has seen Othopedic but in arrears, had been recommended knee replacement - Continue to use icy hot with lidocaine and Tylenol  Medication management -     Magnesium      Continue diet and meds as discussed. Further disposition pending results of labs. Discussed med's effects and SE's.   Over 30 minutes of exam, counseling, chart review, and critical decision making was performed.   Future Appointments  Date Time Provider [place]  02/09/2022  3:00 PM Alycia Rossetti, NP [facility] None  05/06/2022  1:45 PM Tyson Dense T, DPM [facility]  02/10/2023  3:00 PM Alycia Rossetti, NP [facility] None     ----------------------------------------------------------------------------------------------------------------------  HPI 66 y.o. female  presents for complete physical on hypertension, cholesterol, history of prediabetes, morbid obesity (BMI 50+) and vitamin D deficiency.   She has bilateral knee OA, takes tylenol as needed for pain but is not providing relief.  She has been advised she will need a knee replacement but is currently in arrears at Orthopedic office so has not gone back for further treatment .  BMI is There is no height or weight on file to calculate BMI., she has not been working on diet and exercise. . Not currently checking weights, needs new scale. She has lost 11 pounds in the past 5 months but has not made any changes.  Does agree diet and exercise regimen needs to change Wt Readings from Last 3 Encounters:  10/22/21 (!) 336 lb (152.4 kg)  03/26/21 (!) 344 lb 12.8 oz (156.4 kg)  12/03/20 (!) 338 lb (153.3 kg)   She reports she has not checked her BP recently but bought new BP cuff, today their BP is   by provider manual recheck  She does not workout. She denies chest pain, shortness of breath, dizziness.   She is not on cholesterol medication and denies myalgias. Her cholesterol is at goal. The cholesterol last visit was:   Lab Results  Component Value Date   CHOL 177 10/22/2021   HDL 59 10/22/2021   LDLCALC 101 (H) 10/22/2021   TRIG 80 10/22/2021   CHOLHDL 3.0 10/22/2021    She has not been working on diet and exercise for history of prediabetes, and denies increased appetite, nausea, paresthesia  of the feet, polydipsia, polyuria, visual disturbances, vomiting and weight loss. Last A1C in the office was:  Lab Results  Component Value Date   HGBA1C 5.5 10/22/2021   Patient is not regular on Vitamin D supplement and remains very low at last check:   Lab Results  Component Value Date   VD25OH 10 (L) 12/03/2020     Patient is on allopurinol for gout and  does not report a recent flare.  Lab Results  Component Value Date   LABURIC 6.0 04/02/2019      Current Medications:    Current Outpatient Medications (Cardiovascular):    bumetanide (BUMEX) 1 MG tablet, Take 1 tablet (1 mg total) by mouth as needed.   doxazosin (CARDURA) 8 MG tablet, TAKE 1 TABLET AT BEDTIME FOR BLOOD PRESSURE   Current Outpatient Medications (Analgesics):    acetaminophen (TYLENOL) 500 MG tablet, Take 500 mg by mouth every 6 (six) hours as needed.   allopurinol (ZYLOPRIM) 300 MG tablet, TAKE 1 TABLET DAILY TO PREVENT GOUT   aspirin 81 MG tablet, Take 81 mg by mouth daily.   Current Outpatient Medications (Other):    gabapentin (NEURONTIN) 600 MG tablet, TAKE 1/2 TO 1 TABLET 3 X /DAY AS NEEDED FOR CHRONIC PAIN   Allergies:  Allergies  Allergen Reactions   Ace Inhibitors     Cough   Losartan    Paxil [Paroxetine Hcl]     Mood swings   Prednisone     Nausea/ irratated   Vitamin D Analogs     Cramping     Medical History:  Past Medical History:  Diagnosis Date   Anemia    DDD (degenerative disc disease)    DJD (degenerative joint disease)    Elevated hemoglobin A1c    GERD (gastroesophageal reflux disease)    Hyperlipidemia    Hypertension    Obesity    Sickle cell trait ([place])    Immunization History  Administered Date(s) Administered   PFIZER(Purple Top)SARS-COV-2 Vaccination 08/22/2019, 09/12/2019, 06/05/2020   PPD Test 05/02/2013, 06/18/2014, 07/27/2016, 08/22/2017, 09/12/2018   Td 07/01/2015   Health Maintenance  Topic Date Due   Zoster Vaccines- Shingrix (1 of 2) Never done   Pneumonia Vaccine 46+ Years old (1 - PCV) Never done   COVID-19 Vaccine (4 - Pfizer risk series) 07/31/2020   INFLUENZA VACCINE  Never done   MAMMOGRAM  04/07/2022   TETANUS/TDAP  06/30/2025   COLONOSCOPY (Pts 45-92yr Insurance coverage will need to be confirmed)  02/26/2030   DEXA SCAN  Completed   Hepatitis C Screening  Completed   HPV VACCINES  Aged  Out   Eye Exam- overdue , pt aware she should schedule PAP DR. HENLEY 2020, needs to schedule DEXA 10/21 normal MGM 10/21 negative Colonoscopy 02/2020- no significant pathologic changes, F/U 5 years suggested PREVNAR: WANTS TO WAIT  SURGICAL HISTORY She  has a past surgical history that includes Shoulder surgery (Left, 2007). FAMILY HISTORY Her family history includes Cancer in her father; Diabetes in her brother and sister; Heart disease in her mother; Mental illness in her sister; Multiple myeloma in her father; Obstructive Sleep Apnea in her brother. SOCIAL HISTORY She  reports that she has never smoked. She has never used smokeless tobacco. She reports current alcohol use of about 6.0 standard drinks of alcohol per week.   Review of Systems:  Review of Systems  Constitutional:  Negative for chills, fever, malaise/fatigue and weight loss.  HENT:  Negative for congestion, ear discharge, hearing  loss, sore throat and tinnitus.   Eyes:  Negative for blurred vision, double vision, discharge and redness.       Floater in right eye, has been several years  Respiratory:  Negative for cough, shortness of breath and wheezing.   Cardiovascular:  Positive for leg swelling (feet bilaterally). Negative for chest pain, palpitations, orthopnea and claudication.  Gastrointestinal:  Positive for diarrhea (if eats something tha upsets stomach). Negative for abdominal pain, blood in stool, constipation, heartburn, melena, nausea and vomiting.  Genitourinary: Negative.  Negative for dysuria, frequency and urgency.  Musculoskeletal:  Positive for joint pain. Negative for back pain (r knee) and falls. Myalgias: right arm. Skin:  Negative for rash.  Neurological:  Negative for dizziness, tingling, sensory change, seizures, weakness and headaches.  Endo/Heme/Allergies:  Negative for polydipsia. Does not bruise/bleed easily.  Psychiatric/Behavioral:  Negative for depression, hallucinations, memory loss and  suicidal ideas. The patient has insomnia (wakes up frequently). The patient is not nervous/anxious.   All other systems reviewed and are negative.   Physical Exam: There were no vitals taken for this visit. Wt Readings from Last 3 Encounters:  10/22/21 (!) 336 lb (152.4 kg)  03/26/21 (!) 344 lb 12.8 oz (156.4 kg)  12/03/20 (!) 338 lb (153.3 kg)   General Appearance: Morbidly obese, in no apparent distress. Eyes: PERRLA, EOMs, conjunctiva no swelling or erythema Sinuses: No Frontal/maxillary tenderness ENT/Mouth: Ext aud canals clear, TMs without erythema, bulging. No erythema, swelling, or exudate on post pharynx.  Tonsils not swollen or erythematous. Hearing normal.  Neck: Supple, thyroid normal.  Respiratory: Respiratory effort normal, BS equal bilaterally without rales, rhonchi, wheezing or stridor.  Cardio: Distant heart sounds due to body habitus; RRR with no audible MRGs. Brisk peripheral pulses with some 1+ pitting edema to bilateral ankles.  Abdomen: Soft, obese abdomen, + BS.  Non tender, no guarding, rebound, hernias, masses. Lymphatics: Non tender without lymphadenopathy.  Musculoskeletal: Full ROM, 5/5 strength, Normal gait Skin: Warm, dry without rashes, lesions, ecchymosis.  Neuro: Cranial nerves intact. No cerebellar symptoms.  Psych: Awake and oriented X 3, normal affect, Insight and Judgment appropriate.  EKG- normal  Colum Colt Mikki Santee, NP 1:46 PM [facility]

## 2022-02-09 ENCOUNTER — Encounter: Payer: 59 | Admitting: Nurse Practitioner

## 2022-02-09 DIAGNOSIS — R7309 Other abnormal glucose: Secondary | ICD-10-CM

## 2022-02-09 DIAGNOSIS — E559 Vitamin D deficiency, unspecified: Secondary | ICD-10-CM

## 2022-02-09 DIAGNOSIS — Z1329 Encounter for screening for other suspected endocrine disorder: Secondary | ICD-10-CM

## 2022-02-09 DIAGNOSIS — G8929 Other chronic pain: Secondary | ICD-10-CM

## 2022-02-09 DIAGNOSIS — D573 Sickle-cell trait: Secondary | ICD-10-CM

## 2022-02-09 DIAGNOSIS — M109 Gout, unspecified: Secondary | ICD-10-CM

## 2022-02-09 DIAGNOSIS — Z0001 Encounter for general adult medical examination with abnormal findings: Secondary | ICD-10-CM

## 2022-02-09 DIAGNOSIS — Z79899 Other long term (current) drug therapy: Secondary | ICD-10-CM

## 2022-02-09 DIAGNOSIS — I10 Essential (primary) hypertension: Secondary | ICD-10-CM

## 2022-02-09 DIAGNOSIS — Z136 Encounter for screening for cardiovascular disorders: Secondary | ICD-10-CM

## 2022-02-09 DIAGNOSIS — Z1389 Encounter for screening for other disorder: Secondary | ICD-10-CM

## 2022-02-09 DIAGNOSIS — E782 Mixed hyperlipidemia: Secondary | ICD-10-CM

## 2022-03-06 ENCOUNTER — Other Ambulatory Visit: Payer: Self-pay | Admitting: Internal Medicine

## 2022-03-09 NOTE — Progress Notes (Unsigned)
COMPLETE PHYSICAL  Assessment and Plan:   Encounter for general adult medical examination with abnormal findings Needs Prevnar- declines   Sickle cell trait ([place]) Monitor  Morbid obesity ([place]) - follow up 3 months for progress monitoring - increase veggies, decrease carbs - long discussion about weight loss, diet, and exercise  Anemia due to blood loss Monitor  Abnormal glucose -     Hemoglobin A1c Discussed disease progression and risks Discussed diet/exercise, weight management and risk modification  Hyperlipidemia, mixed -     Lipid panel check lipids decrease fatty foods increase activity.   Essential hypertension - continue medications, DASH diet, exercise and encourage monitoring at home. Call if greater than 130/80.  -     CBC with Differential/Platelet -     COMPLETE METABOLIC PANEL WITH GFR -     TSH -     Urinalysis, Routine w reflex microscopic -     Microalbumin / creatinine urine ratio -     EKG 12-Lead  Vitamin D deficiency -     VITAMIN D 25 Hydroxy (Vit-D Deficiency, Fractures)  Idiopathic gout, unspecified chronicity, unspecified site Continue on allopurinol, no recent flares   Chronic pain of both knees -     Has seen Othopedic but in arrears, had been recommended knee replacement - Continue to use icy hot with lidocaine and Tylenol  Medication management -     Magnesium      Continue diet and meds as discussed. Further disposition pending results of labs. Discussed med's effects and SE's.   Over 30 minutes of exam, counseling, chart review, and critical decision making was performed.   Future Appointments  Date Time Provider [place]  03/10/2022  3:00 PM Alycia Rossetti, NP [facility] None  05/06/2022  1:45 PM Tyson Dense T, DPM [facility]  03/14/2023  3:00 PM Alycia Rossetti, NP [facility] None     ----------------------------------------------------------------------------------------------------------------------  HPI 66 y.o. female  presents for complete physical on hypertension, cholesterol, history of prediabetes, morbid obesity (BMI 50+) and vitamin D deficiency.   She has bilateral knee OA, takes tylenol as needed for pain but is not providing relief.  She has been advised she will need a knee replacement but is currently in arrears at Orthopedic office so has not gone back for further treatment .  BMI is There is no height or weight on file to calculate BMI., she has not been working on diet and exercise. . Not currently checking weights, needs new scale. She has lost 11 pounds in the past 5 months but has not made any changes.  Does agree diet and exercise regimen needs to change Wt Readings from Last 3 Encounters:  10/22/21 (!) 336 lb (152.4 kg)  03/26/21 (!) 344 lb 12.8 oz (156.4 kg)  12/03/20 (!) 338 lb (153.3 kg)   She reports she has not checked her BP recently but bought new BP cuff, today their BP is   by provider manual recheck  She does not workout. She denies chest pain, shortness of breath, dizziness.   She is not on cholesterol medication and denies myalgias. Her cholesterol is at goal. The cholesterol last visit was:   Lab Results  Component Value Date   CHOL 177 10/22/2021   HDL 59 10/22/2021   LDLCALC 101 (H) 10/22/2021   TRIG 80 10/22/2021   CHOLHDL 3.0 10/22/2021    She has not been working on diet and exercise for history of prediabetes, and denies increased appetite, nausea, paresthesia  of the feet, polydipsia, polyuria, visual disturbances, vomiting and weight loss. Last A1C in the office was:  Lab Results  Component Value Date   HGBA1C 5.5 10/22/2021   Patient is not regular on Vitamin D supplement and remains very low at last check:   Lab Results  Component Value Date   VD25OH 10 (L) 12/03/2020     Patient is on allopurinol for gout and  does not report a recent flare.  Lab Results  Component Value Date   LABURIC 6.0 04/02/2019      Current Medications:    Current Outpatient Medications (Cardiovascular):    bumetanide (BUMEX) 1 MG tablet, Take 1 tablet (1 mg total) by mouth as needed.   doxazosin (CARDURA) 8 MG tablet, TAKE 1 TABLET AT BEDTIME FOR BLOOD PRESSURE   Current Outpatient Medications (Analgesics):    acetaminophen (TYLENOL) 500 MG tablet, Take 500 mg by mouth every 6 (six) hours as needed.   allopurinol (ZYLOPRIM) 300 MG tablet, TAKE 1 TABLET DAILY TO PREVENT GOUT   aspirin 81 MG tablet, Take 81 mg by mouth daily.   Current Outpatient Medications (Other):    gabapentin (NEURONTIN) 600 MG tablet, TAKE 1/2 TO 1 TABLET 3 X /DAY AS NEEDED FOR CHRONIC PAIN   Allergies:  Allergies  Allergen Reactions   Ace Inhibitors     Cough   Losartan    Paxil [Paroxetine Hcl]     Mood swings   Prednisone     Nausea/ irratated   Vitamin D Analogs     Cramping     Medical History:  Past Medical History:  Diagnosis Date   Anemia    DDD (degenerative disc disease)    DJD (degenerative joint disease)    Elevated hemoglobin A1c    GERD (gastroesophageal reflux disease)    Hyperlipidemia    Hypertension    Obesity    Sickle cell trait ([place])    Immunization History  Administered Date(s) Administered   PFIZER(Purple Top)SARS-COV-2 Vaccination 08/22/2019, 09/12/2019, 06/05/2020   PPD Test 05/02/2013, 06/18/2014, 07/27/2016, 08/22/2017, 09/12/2018   Td 07/01/2015   Health Maintenance  Topic Date Due   Zoster Vaccines- Shingrix (1 of 2) Never done   Pneumonia Vaccine 46+ Years old (1 - PCV) Never done   COVID-19 Vaccine (4 - Pfizer risk series) 07/31/2020   INFLUENZA VACCINE  Never done   MAMMOGRAM  04/07/2022   TETANUS/TDAP  06/30/2025   COLONOSCOPY (Pts 45-92yr Insurance coverage will need to be confirmed)  02/26/2030   DEXA SCAN  Completed   Hepatitis C Screening  Completed   HPV VACCINES  Aged  Out   Eye Exam- overdue , pt aware she should schedule PAP DR. HENLEY 2020, needs to schedule DEXA 10/21 normal MGM 10/21 negative Colonoscopy 02/2020- no significant pathologic changes, F/U 5 years suggested PREVNAR: WANTS TO WAIT  SURGICAL HISTORY She  has a past surgical history that includes Shoulder surgery (Left, 2007). FAMILY HISTORY Her family history includes Cancer in her father; Diabetes in her brother and sister; Heart disease in her mother; Mental illness in her sister; Multiple myeloma in her father; Obstructive Sleep Apnea in her brother. SOCIAL HISTORY She  reports that she has never smoked. She has never used smokeless tobacco. She reports current alcohol use of about 6.0 standard drinks of alcohol per week.   Review of Systems:  Review of Systems  Constitutional:  Negative for chills, fever, malaise/fatigue and weight loss.  HENT:  Negative for congestion, ear discharge, hearing  loss, sore throat and tinnitus.   Eyes:  Negative for blurred vision, double vision, discharge and redness.       Floater in right eye, has been several years  Respiratory:  Negative for cough, shortness of breath and wheezing.   Cardiovascular:  Positive for leg swelling (feet bilaterally). Negative for chest pain, palpitations, orthopnea and claudication.  Gastrointestinal:  Positive for diarrhea (if eats something tha upsets stomach). Negative for abdominal pain, blood in stool, constipation, heartburn, melena, nausea and vomiting.  Genitourinary: Negative.  Negative for dysuria, frequency and urgency.  Musculoskeletal:  Positive for joint pain. Negative for back pain (r knee) and falls. Myalgias: right arm. Skin:  Negative for rash.  Neurological:  Negative for dizziness, tingling, sensory change, seizures, weakness and headaches.  Endo/Heme/Allergies:  Negative for polydipsia. Does not bruise/bleed easily.  Psychiatric/Behavioral:  Negative for depression, hallucinations, memory loss and  suicidal ideas. The patient has insomnia (wakes up frequently). The patient is not nervous/anxious.   All other systems reviewed and are negative.   Physical Exam: There were no vitals taken for this visit. Wt Readings from Last 3 Encounters:  10/22/21 (!) 336 lb (152.4 kg)  03/26/21 (!) 344 lb 12.8 oz (156.4 kg)  12/03/20 (!) 338 lb (153.3 kg)   General Appearance: Morbidly obese, in no apparent distress. Eyes: PERRLA, EOMs, conjunctiva no swelling or erythema Sinuses: No Frontal/maxillary tenderness ENT/Mouth: Ext aud canals clear, TMs without erythema, bulging. No erythema, swelling, or exudate on post pharynx.  Tonsils not swollen or erythematous. Hearing normal.  Neck: Supple, thyroid normal.  Respiratory: Respiratory effort normal, BS equal bilaterally without rales, rhonchi, wheezing or stridor.  Cardio: Distant heart sounds due to body habitus; RRR with no audible MRGs. Brisk peripheral pulses with some 1+ pitting edema to bilateral ankles.  Abdomen: Soft, obese abdomen, + BS.  Non tender, no guarding, rebound, hernias, masses. Lymphatics: Non tender without lymphadenopathy.  Musculoskeletal: Full ROM, 5/5 strength, Normal gait Skin: Warm, dry without rashes, lesions, ecchymosis.  Neuro: Cranial nerves intact. No cerebellar symptoms.  Psych: Awake and oriented X 3, normal affect, Insight and Judgment appropriate.  EKG- normal  Rylee Huestis Mikki Santee, NP 1:16 PM [facility]

## 2022-03-10 ENCOUNTER — Encounter: Payer: Self-pay | Admitting: Nurse Practitioner

## 2022-03-10 ENCOUNTER — Ambulatory Visit (INDEPENDENT_AMBULATORY_CARE_PROVIDER_SITE_OTHER): Payer: 59 | Admitting: Nurse Practitioner

## 2022-03-10 VITALS — BP 128/80 | HR 89 | Temp 97.3°F | Ht 67.0 in | Wt 332.0 lb

## 2022-03-10 DIAGNOSIS — I10 Essential (primary) hypertension: Secondary | ICD-10-CM

## 2022-03-10 DIAGNOSIS — M109 Gout, unspecified: Secondary | ICD-10-CM

## 2022-03-10 DIAGNOSIS — G8929 Other chronic pain: Secondary | ICD-10-CM

## 2022-03-10 DIAGNOSIS — R7309 Other abnormal glucose: Secondary | ICD-10-CM

## 2022-03-10 DIAGNOSIS — R609 Edema, unspecified: Secondary | ICD-10-CM

## 2022-03-10 DIAGNOSIS — Z0000 Encounter for general adult medical examination without abnormal findings: Secondary | ICD-10-CM | POA: Diagnosis not present

## 2022-03-10 DIAGNOSIS — Z1329 Encounter for screening for other suspected endocrine disorder: Secondary | ICD-10-CM

## 2022-03-10 DIAGNOSIS — Z136 Encounter for screening for cardiovascular disorders: Secondary | ICD-10-CM | POA: Diagnosis not present

## 2022-03-10 DIAGNOSIS — D500 Iron deficiency anemia secondary to blood loss (chronic): Secondary | ICD-10-CM

## 2022-03-10 DIAGNOSIS — D573 Sickle-cell trait: Secondary | ICD-10-CM

## 2022-03-10 DIAGNOSIS — Z0001 Encounter for general adult medical examination with abnormal findings: Secondary | ICD-10-CM

## 2022-03-10 DIAGNOSIS — E559 Vitamin D deficiency, unspecified: Secondary | ICD-10-CM

## 2022-03-10 DIAGNOSIS — Z79899 Other long term (current) drug therapy: Secondary | ICD-10-CM

## 2022-03-10 DIAGNOSIS — E782 Mixed hyperlipidemia: Secondary | ICD-10-CM

## 2022-03-10 DIAGNOSIS — Z1389 Encounter for screening for other disorder: Secondary | ICD-10-CM

## 2022-03-10 MED ORDER — BUMETANIDE 1 MG PO TABS: 1.0000 mg | ORAL_TABLET | ORAL | 4 refills | Status: AC | PRN

## 2022-03-10 NOTE — Patient Instructions (Signed)

## 2022-03-11 LAB — HEMOGLOBIN A1C
Hgb A1c MFr Bld: 5.5 % of total Hgb (ref <5.7)
Mean Plasma Glucose: 111 mg/dL
eAG (mmol/L): 6.2 mmol/L

## 2022-03-11 LAB — URINALYSIS, ROUTINE W REFLEX MICROSCOPIC
Bilirubin Urine: NEGATIVE
Glucose, UA: NEGATIVE
Hgb urine dipstick: NEGATIVE
Hyaline Cast: NONE SEEN /LPF
Ketones, ur: NEGATIVE
Nitrite: NEGATIVE
Protein, ur: NEGATIVE
RBC / HPF: NONE SEEN /HPF (ref 0–2)
Specific Gravity, Urine: 1.011 (ref 1.001–1.035)
pH: <=5 (ref 5.0–8.0)

## 2022-03-11 LAB — CBC WITH DIFFERENTIAL/PLATELET
Absolute Monocytes: 469 cells/uL (ref 200–950)
Basophils Absolute: 28 cells/uL (ref 0–200)
Basophils Relative: 0.4 %
Eosinophils Absolute: 119 cells/uL (ref 15–500)
Eosinophils Relative: 1.7 %
HCT: 37 % (ref 35.0–45.0)
Hemoglobin: 12.2 g/dL (ref 11.7–15.5)
Lymphs Abs: 2485 cells/uL (ref 850–3900)
MCH: 29.4 pg (ref 27.0–33.0)
MCHC: 33 g/dL (ref 32.0–36.0)
MCV: 89.2 fL (ref 80.0–100.0)
MPV: 10.5 fL (ref 7.5–12.5)
Monocytes Relative: 6.7 %
Neutro Abs: 3899 cells/uL (ref 1500–7800)
Neutrophils Relative %: 55.7 %
Platelets: 210 10*3/uL (ref 140–400)
RBC: 4.15 10*6/uL (ref 3.80–5.10)
RDW: 12.9 % (ref 11.0–15.0)
Total Lymphocyte: 35.5 %
WBC: 7 10*3/uL (ref 3.8–10.8)

## 2022-03-11 LAB — MICROALBUMIN / CREATININE URINE RATIO
Creatinine, Urine: 92 mg/dL (ref 20–275)
Microalb Creat Ratio: 7 mcg/mg creat (ref <30)
Microalb, Ur: 0.6 mg/dL

## 2022-03-11 LAB — COMPLETE METABOLIC PANEL WITH GFR
AG Ratio: 1.1 (calc) (ref 1.0–2.5)
ALT: 12 U/L (ref 6–29)
AST: 15 U/L (ref 10–35)
Albumin: 3.9 g/dL (ref 3.6–5.1)
Alkaline phosphatase (APISO): 109 U/L (ref 37–153)
BUN: 11 mg/dL (ref 7–25)
CO2: 24 mmol/L (ref 20–32)
Calcium: 8.7 mg/dL (ref 8.6–10.4)
Chloride: 104 mmol/L (ref 98–110)
Creat: 0.81 mg/dL (ref 0.50–1.05)
Globulin: 3.4 g/dL (calc) (ref 1.9–3.7)
Glucose, Bld: 104 mg/dL — ABNORMAL HIGH (ref 65–99)
Potassium: 4.3 mmol/L (ref 3.5–5.3)
Sodium: 137 mmol/L (ref 135–146)
Total Bilirubin: 0.9 mg/dL (ref 0.2–1.2)
Total Protein: 7.3 g/dL (ref 6.1–8.1)
eGFR: 80 mL/min/{1.73_m2} (ref >=60)

## 2022-03-11 LAB — VITAMIN D 25 HYDROXY (VIT D DEFICIENCY, FRACTURES): Vit D, 25-Hydroxy: 6 ng/mL — ABNORMAL LOW (ref 30–100)

## 2022-03-11 LAB — LIPID PANEL
Cholesterol: 177 mg/dL (ref <200)
HDL: 65 mg/dL (ref >=50)
LDL Cholesterol (Calc): 98 mg/dL (calc)
Non-HDL Cholesterol (Calc): 112 mg/dL (calc) (ref <130)
Total CHOL/HDL Ratio: 2.7 (calc) (ref <5.0)
Triglycerides: 58 mg/dL (ref <150)

## 2022-03-11 LAB — MICROSCOPIC MESSAGE

## 2022-03-11 LAB — MAGNESIUM: Magnesium: 2 mg/dL (ref 1.5–2.5)

## 2022-03-11 LAB — URIC ACID: Uric Acid, Serum: 4.7 mg/dL (ref 2.5–7.0)

## 2022-03-11 LAB — TSH: TSH: 2.61 mIU/L (ref 0.40–4.50)

## 2022-04-19 ENCOUNTER — Other Ambulatory Visit: Payer: Self-pay | Admitting: Obstetrics and Gynecology

## 2022-04-19 DIAGNOSIS — Z1231 Encounter for screening mammogram for malignant neoplasm of breast: Secondary | ICD-10-CM

## 2022-05-06 ENCOUNTER — Ambulatory Visit: Payer: 59 | Admitting: Podiatry

## 2022-05-06 ENCOUNTER — Encounter: Payer: Self-pay | Admitting: Podiatry

## 2022-05-06 DIAGNOSIS — B351 Tinea unguium: Secondary | ICD-10-CM | POA: Diagnosis not present

## 2022-05-06 DIAGNOSIS — D2371 Other benign neoplasm of skin of right lower limb, including hip: Secondary | ICD-10-CM | POA: Diagnosis not present

## 2022-05-06 DIAGNOSIS — D2372 Other benign neoplasm of skin of left lower limb, including hip: Secondary | ICD-10-CM

## 2022-05-06 DIAGNOSIS — M79676 Pain in unspecified toe(s): Secondary | ICD-10-CM | POA: Diagnosis not present

## 2022-05-09 NOTE — Progress Notes (Signed)
Presents today chief complaint of painful elongated toenails.  Objective: Toenails are long thick yellow dystrophic onychomycotic sharply incurvated.  Pulses are palpable no open lesions or wounds.  Assessment: Pain limb secondary onychomycosis.  Plan: Debridement toenails 1 through 5 bilateral covered service secondary to pain.

## 2022-06-14 ENCOUNTER — Ambulatory Visit
Admission: RE | Admit: 2022-06-14 | Discharge: 2022-06-14 | Disposition: A | Discharge status: Home / Self Care | Payer: 59 | Source: Ambulatory Visit | Attending: Obstetrics and Gynecology | Admitting: Obstetrics and Gynecology

## 2022-06-14 DIAGNOSIS — Z1231 Encounter for screening mammogram for malignant neoplasm of breast: Secondary | ICD-10-CM

## 2022-08-05 ENCOUNTER — Ambulatory Visit: Payer: 59 | Admitting: Podiatry

## 2022-08-05 ENCOUNTER — Encounter: Payer: Self-pay | Admitting: Podiatry

## 2022-08-05 DIAGNOSIS — D2372 Other benign neoplasm of skin of left lower limb, including hip: Secondary | ICD-10-CM

## 2022-08-05 DIAGNOSIS — D2371 Other benign neoplasm of skin of right lower limb, including hip: Secondary | ICD-10-CM

## 2022-08-05 DIAGNOSIS — M79676 Pain in unspecified toe(s): Secondary | ICD-10-CM | POA: Diagnosis not present

## 2022-08-05 DIAGNOSIS — L84 Corns and callosities: Secondary | ICD-10-CM | POA: Diagnosis not present

## 2022-08-05 DIAGNOSIS — B351 Tinea unguium: Secondary | ICD-10-CM | POA: Diagnosis not present

## 2022-08-05 DIAGNOSIS — M7752 Other enthesopathy of left foot: Secondary | ICD-10-CM | POA: Diagnosis not present

## 2022-08-05 MED ORDER — DEXAMETHASONE SODIUM PHOSPHATE 120 MG/30ML IJ SOLN
2.0000 mg | Freq: Once | INTRAMUSCULAR | Status: AC
  Administered 2022-08-05: 2 mg via INTRA_ARTICULAR

## 2022-08-05 NOTE — Progress Notes (Signed)
She presents today chief complaint of painful elongated toenails painful callus fifth digit of the left foot.  Objective: Vitals are stable oriented x 3 pulses are palpable.  He has bursitis beneath the painful lesion PIPJ fifth digit left foot.  Otherwise her toenails are long thick yellow dystrophic onychomycotic painful palpation.  Assessment: Pain in limb secondary to bursitis benign skin lesion and painful elongated toenails.  Plan: Debridement of toenails 1 through 5 bilaterally injected 2 mg of dexamethasone sent lesion lead to the fifth digit of the left foot overlying the PIPJ.  Debrided the reactive hyperkeratotic lesion I will follow-up with her on an as-needed basis or in 3 months

## 2022-08-18 ENCOUNTER — Other Ambulatory Visit: Payer: Self-pay

## 2022-08-18 DIAGNOSIS — I10 Essential (primary) hypertension: Secondary | ICD-10-CM

## 2022-08-18 MED ORDER — DOXAZOSIN MESYLATE 8 MG PO TABS: ORAL_TABLET | ORAL | 11 refills | Status: DC

## 2022-09-10 NOTE — Progress Notes (Unsigned)
FOLLOW UP 6 MONTH  Assessment and Plan:   Rosalind was seen today for follow-up.  Diagnoses and all orders for this visit:  Essential hypertension       Using Bumex  PRN, Cardura  QHS Monitor blood pressure at home; call if consistently over 130/80 Continue DASH diet.   Reminder to go to the ER if any CP, SOB, nausea, dizziness, severe HA, changes vision/speech, left arm numbness and tingling and jaw pain. -     CBC with Differential/Platelet -     COMPLETE METABOLIC PANEL WITH GFR -     Hemoglobin A1c  Sickle cell trait (HCC) Monitor  Morbid obesity with BMI of 50.0-59.9, adult (HCC) Discussed dietary and exercise modifications, she is down 15 pounds in the past 6 months Discussed further weight loss and management to improve health, offered Saxenda or Wegovy, pt refuses- not currently receptive to other treatment -     CBC with Differential/Platelet -     COMPLETE METABOLIC PANEL WITH GFR -     Hemoglobin A1c -  TSH  Abnormal glucose Discussed dietary and exercise modifications -     Hemoglobin A1c  Hyperlipidemia, mixed Not on medications at this time Discussed dietary and exercise modifications Low fat diet -     Lipid panel   Vitamin D deficiency Continue supplementation to maintain goal of 70-100 Not taking supplementation at this time - Vit D level Will try ergocalciferol 50000 units once a week- If causes more cramping or diarrhe then stop   Idiopathic gout, unspecified chronicity, unspecified site Continue allopurinol  daily No recent flares Discussed dietary modifications Continue to monitor  Chronic pain of both knees Strongly encouraged weight loss Unable to take NSAIDs due to ulcerations on ileocecal valve on colonoscopy, continue Tylenol PRN   Medication management -TSH    Continue diet and meds as discussed. Further disposition pending results of labs. Discussed med's effects and SE's.   Over 30 minutes of face to face interview,  exam, counseling, chart review, and critical decision making was performed.   Future Appointments  Date Time Provider Department Center  11/04/2022  2:15 PM [place], [place] [facility]  03/14/2023  3:00 PM Raynelle Dick, NP [facility] None    ----------------------------------------------------------------------------------------------------------------------  HPI 67 y.o. female  presents for 3 month follow up on HTN, HLD, history of prediabetes, morbid obesity (BMI 50+) and vitamin D deficiency.   She reports overall she is doing well, she does not have any health or medication concerns today.  She has bilateral knee OA, last saw Dr. Turner Daniels in 2015, states her right knee has been bothering her more. She is taking Tylenol arthritis. Had steroid injections in the past with minimal relief.  Also uses Gabapentin 600 mg one time a day  She recently had a colonoscopy 09/2019 showed multiple ulcers of ileocecal valve most likely related to NSAID use so advised to avoid. Next colonoscopy is due 2031   BMI is Body mass index is 49.74 kg/m., she has not been working on diet and exercise. She has been trying to eat healthier- fresh fruits/vegetables- salads giving her diarrhea. She has used Phentermine in the past but was unable to tolerate. She is very afraid of needles so does not wish to use Saxenda.  Has been watching diet Weight loss of 15 pounds in the past 6 months Wt Readings from Last 3 Encounters:  09/13/22 (!) 317 lb 9.6 oz (144.1 kg)  03/10/22 (!) 332 lb (150.6 kg)  10/22/21 (!) 336 lb (152.4 kg)   She reports she has not checked her BP recently but bought new BP cuff, today their BP is BP: 126/84   BP Readings from Last 3 Encounters:  09/13/22 126/84  03/10/22 128/80  10/22/21 119/71  She does not workout. She denies chest pain, shortness of breath, dizziness.    She is not on cholesterol medication and denies myalgias. Her cholesterol is at goal. The cholesterol  last visit was:   Lab Results  Component Value Date   CHOL 177 03/10/2022   HDL 65 03/10/2022   LDLCALC 98 03/10/2022   TRIG 58 03/10/2022   CHOLHDL 2.7 03/10/2022    She has not been working on diet and exercise for history of prediabetes, and denies increased appetite, nausea, paresthesia of the feet, polydipsia, polyuria, visual disturbances, vomiting and weight loss. Last A1C in the office was:  Lab Results  Component Value Date   HGBA1C 5.5 03/10/2022   Patient is not regular on Vitamin D supplement and remains very low at last check:  Vit D causes muscle cramps Lab Results  Component Value Date   VD25OH 6 (L) 03/10/2022     Patient is on allopurinol for gout and does not report a recent flare.  Lab Results  Component Value Date   LABURIC 4.7 03/10/2022      Current Medications:    Current Outpatient Medications (Cardiovascular):    bumetanide (BUMEX) 1 MG tablet, Take 1 tablet (1 mg total) by mouth as needed.   doxazosin (CARDURA) 8 MG tablet, TAKE 1 TABLET AT BEDTIME FOR BLOOD PRESSURE   Current Outpatient Medications (Analgesics):    acetaminophen (TYLENOL) 500 MG tablet, Take 500 mg by mouth every 6 (six) hours as needed.   allopurinol (ZYLOPRIM) 300 MG tablet, TAKE 1 TABLET DAILY TO PREVENT GOUT   aspirin 81 MG tablet, Take 81 mg by mouth daily.   Current Outpatient Medications (Other):    gabapentin (NEURONTIN) 600 MG tablet, TAKE 1/2 TO 1 TABLET 3 X /DAY AS NEEDED FOR CHRONIC PAIN   Allergies:  Allergies  Allergen Reactions   Ace Inhibitors     Cough   Losartan    Paxil [Paroxetine Hcl]     Mood swings   Prednisone     Nausea/ irratated   Vitamin D Analogs     Cramping     Medical History:  Past Medical History:  Diagnosis Date   Anemia    DDD (degenerative disc disease)    DJD (degenerative joint disease)    Elevated hemoglobin A1c    GERD (gastroesophageal reflux disease)    Hyperlipidemia    Hypertension    Obesity    Sickle cell  trait    Immunization History  Administered Date(s) Administered   PFIZER(Purple Top)SARS-COV-2 Vaccination 08/22/2019, 09/12/2019, 06/05/2020   PPD Test 05/02/2013, 06/18/2014, 07/27/2016, 08/22/2017, 09/12/2018   Td 07/01/2015   Health Maintenance  Topic Date Due   Zoster Vaccines- Shingrix (1 of 2) Never done   Pneumonia Vaccine 108+ Years old (1 of 1 - PCV) Never done   COVID-19 Vaccine (4 - 2023-24 season) 01/22/2022   INFLUENZA VACCINE  12/23/2022   MAMMOGRAM  06/15/2023   DTaP/Tdap/Td (2 - Tdap) 06/30/2025   COLONOSCOPY (Pts 45-13yrs Insurance coverage will need to be confirmed)  02/26/2030   DEXA SCAN  Completed   Hepatitis C Screening  Completed   HPV VACCINES  Aged Out   PAP: DR. Ambrose Mantle 2020 DEXA 02/2020 MGM 02/2020  Colonoscopy 02/2020 PREVNAR: Discussed with patient, DUE  SURGICAL HISTORY She  has a past surgical history that includes Shoulder surgery (Left, 2007). FAMILY HISTORY Her family history includes Cancer in her father; Diabetes in her brother and sister; Heart disease in her mother; Mental illness in her sister; Multiple myeloma in her father; Obstructive Sleep Apnea in her brother. SOCIAL HISTORY She  reports that she has never smoked. She has never used smokeless tobacco. She reports current alcohol use of about 6.0 standard drinks of alcohol per week.   Review of Systems:  Review of Systems  Constitutional:  Negative for chills, fever, malaise/fatigue and weight loss.  HENT:  Negative for congestion, hearing loss and tinnitus.   Eyes:  Negative for blurred vision and double vision.  Respiratory:  Negative for cough, shortness of breath and wheezing.   Cardiovascular:  Negative for chest pain, palpitations, orthopnea, claudication and leg swelling.  Gastrointestinal:  Negative for abdominal pain, blood in stool, constipation, diarrhea, heartburn, melena, nausea and vomiting.  Genitourinary: Negative.   Musculoskeletal:  Positive for joint pain.  Negative for falls and myalgias.  Skin:  Negative for rash.  Neurological:  Negative for dizziness, tingling, tremors, sensory change, loss of consciousness, weakness and headaches.  Endo/Heme/Allergies:  Negative for polydipsia.  Psychiatric/Behavioral: Negative.  Negative for depression, memory loss and suicidal ideas. The patient is not nervous/anxious and does not have insomnia.   All other systems reviewed and are negative.   Physical Exam: BP 126/84   Pulse 93   Temp (!) 97.5 F (36.4 C)   Ht 5\' 7"  (1.702 m)   Wt (!) 317 lb 9.6 oz (144.1 kg)   SpO2 97%   BMI 49.74 kg/m  Wt Readings from Last 3 Encounters:  09/13/22 (!) 317 lb 9.6 oz (144.1 kg)  03/10/22 (!) 332 lb (150.6 kg)  10/22/21 (!) 336 lb (152.4 kg)   General Appearance: Morbidly obese, pleasant female in no apparent distress. Eyes: PERRLA, EOMs, conjunctiva no swelling or erythema Sinuses: No Frontal/maxillary tenderness ENT/Mouth: Ext aud canals clear, TMs without erythema, bulging. No erythema, swelling, or exudate on post pharynx.   Hearing normal.  Neck: Supple, thyroid normal.  Respiratory: Respiratory effort normal, BS equal bilaterally without rales, rhonchi, wheezing or stridor.  Cardio: Distant heart sounds due to body habitus; RRR with no audible MRGs. Brisk peripheral pulses with some non-pitting edema to bilateral ankles.  Abdomen: Soft, obese abdomen, + BS.  Non tender, no guarding, rebound, hernias, masses. Lymphatics: Non tender without lymphadenopathy.  Musculoskeletal: Full ROM, 5/5 strength, antalgic gait- favors right knee Skin: Warm, dry without rashes, lesions, ecchymosis.  Neuro: Cranial nerves intact. No cerebellar symptoms.  Psych: Awake and oriented X 3, normal affect, Insight and Judgment appropriate.    Raynelle Dick, NP 3:43 PM [facility]

## 2022-09-13 ENCOUNTER — Ambulatory Visit: Payer: 59 | Admitting: Nurse Practitioner

## 2022-09-13 ENCOUNTER — Encounter: Payer: Self-pay | Admitting: Nurse Practitioner

## 2022-09-13 VITALS — BP 126/84 | HR 93 | Temp 97.5°F | Ht 67.0 in | Wt 317.6 lb

## 2022-09-13 DIAGNOSIS — M25562 Pain in left knee: Secondary | ICD-10-CM

## 2022-09-13 DIAGNOSIS — R7309 Other abnormal glucose: Secondary | ICD-10-CM | POA: Diagnosis not present

## 2022-09-13 DIAGNOSIS — M109 Gout, unspecified: Secondary | ICD-10-CM

## 2022-09-13 DIAGNOSIS — I10 Essential (primary) hypertension: Secondary | ICD-10-CM | POA: Diagnosis not present

## 2022-09-13 DIAGNOSIS — D573 Sickle-cell trait: Secondary | ICD-10-CM

## 2022-09-13 DIAGNOSIS — G8929 Other chronic pain: Secondary | ICD-10-CM

## 2022-09-13 DIAGNOSIS — M25561 Pain in right knee: Secondary | ICD-10-CM

## 2022-09-13 DIAGNOSIS — E559 Vitamin D deficiency, unspecified: Secondary | ICD-10-CM

## 2022-09-13 DIAGNOSIS — E782 Mixed hyperlipidemia: Secondary | ICD-10-CM

## 2022-09-13 DIAGNOSIS — Z79899 Other long term (current) drug therapy: Secondary | ICD-10-CM

## 2022-09-13 MED ORDER — VITAMIN D (ERGOCALCIFEROL) 1.25 MG (50000 UNIT) PO CAPS: ORAL_CAPSULE | ORAL | 3 refills | Status: DC

## 2022-09-13 NOTE — Patient Instructions (Signed)

## 2022-09-14 LAB — HEMOGLOBIN A1C
Hgb A1c MFr Bld: 5.7 % of total Hgb — ABNORMAL HIGH (ref <5.7)
Mean Plasma Glucose: 117 mg/dL
eAG (mmol/L): 6.5 mmol/L

## 2022-09-14 LAB — CBC WITH DIFFERENTIAL/PLATELET
Absolute Monocytes: 378 cells/uL (ref 200–950)
Basophils Absolute: 42 cells/uL (ref 0–200)
Basophils Relative: 0.6 %
Eosinophils Absolute: 147 cells/uL (ref 15–500)
Eosinophils Relative: 2.1 %
HCT: 34.7 % — ABNORMAL LOW (ref 35.0–45.0)
Hemoglobin: 11.6 g/dL — ABNORMAL LOW (ref 11.7–15.5)
Lymphs Abs: 2324 cells/uL (ref 850–3900)
MCH: 28.9 pg (ref 27.0–33.0)
MCHC: 33.4 g/dL (ref 32.0–36.0)
MCV: 86.5 fL (ref 80.0–100.0)
MPV: 10.4 fL (ref 7.5–12.5)
Monocytes Relative: 5.4 %
Neutro Abs: 4109 cells/uL (ref 1500–7800)
Neutrophils Relative %: 58.7 %
Platelets: 213 10*3/uL (ref 140–400)
RBC: 4.01 10*6/uL (ref 3.80–5.10)
RDW: 12.7 % (ref 11.0–15.0)
Total Lymphocyte: 33.2 %
WBC: 7 10*3/uL (ref 3.8–10.8)

## 2022-09-14 LAB — VITAMIN D 25 HYDROXY (VIT D DEFICIENCY, FRACTURES): Vit D, 25-Hydroxy: 10 ng/mL — ABNORMAL LOW (ref 30–100)

## 2022-09-14 LAB — LIPID PANEL
Cholesterol: 163 mg/dL (ref <200)
HDL: 56 mg/dL (ref >=50)
LDL Cholesterol (Calc): 93 mg/dL (calc)
Non-HDL Cholesterol (Calc): 107 mg/dL (calc) (ref <130)
Total CHOL/HDL Ratio: 2.9 (calc) (ref <5.0)
Triglycerides: 57 mg/dL (ref <150)

## 2022-09-14 LAB — COMPLETE METABOLIC PANEL WITH GFR
AG Ratio: 1.2 (calc) (ref 1.0–2.5)
ALT: 12 U/L (ref 6–29)
AST: 14 U/L (ref 10–35)
Albumin: 3.8 g/dL (ref 3.6–5.1)
Alkaline phosphatase (APISO): 103 U/L (ref 37–153)
BUN: 16 mg/dL (ref 7–25)
CO2: 27 mmol/L (ref 20–32)
Calcium: 8.7 mg/dL (ref 8.6–10.4)
Chloride: 103 mmol/L (ref 98–110)
Creat: 0.99 mg/dL (ref 0.50–1.05)
Globulin: 3.1 g/dL (calc) (ref 1.9–3.7)
Glucose, Bld: 146 mg/dL — ABNORMAL HIGH (ref 65–99)
Potassium: 4.1 mmol/L (ref 3.5–5.3)
Sodium: 138 mmol/L (ref 135–146)
Total Bilirubin: 0.9 mg/dL (ref 0.2–1.2)
Total Protein: 6.9 g/dL (ref 6.1–8.1)
eGFR: 62 mL/min/{1.73_m2} (ref >=60)

## 2022-09-14 LAB — TSH: TSH: 2.45 mIU/L (ref 0.40–4.50)

## 2022-11-02 ENCOUNTER — Ambulatory Visit: Payer: 59 | Admitting: Nurse Practitioner

## 2022-11-02 ENCOUNTER — Encounter: Payer: Self-pay | Admitting: Nurse Practitioner

## 2022-11-02 VITALS — BP 124/72 | HR 88 | Temp 97.7°F | Ht 67.0 in | Wt 331.6 lb

## 2022-11-02 DIAGNOSIS — M153 Secondary multiple arthritis: Secondary | ICD-10-CM | POA: Diagnosis not present

## 2022-11-02 DIAGNOSIS — M2560 Stiffness of unspecified joint, not elsewhere classified: Secondary | ICD-10-CM

## 2022-11-02 DIAGNOSIS — M5431 Sciatica, right side: Secondary | ICD-10-CM

## 2022-11-02 MED ORDER — DEXAMETHASONE 4 MG PO TABS: ORAL_TABLET | ORAL | 0 refills | Status: DC

## 2022-11-02 MED ORDER — DEXAMETHASONE SODIUM PHOSPHATE 10 MG/ML IJ SOLN: 10.0000 mg | Freq: Once | INTRAMUSCULAR | Status: AC

## 2022-11-02 NOTE — Progress Notes (Signed)
Assessment and Plan:  Alicia Diaz was seen today for an episodic visit.  Diagnoses and all order for this visit:  1. Right sided sciatica Recommend 1,000 mg Acetaminophen up to QID Discussed updated imaging and referral to Orthopedics if s/s fail to improve.  - dexamethasone (DECADRON) injection 10 mg - dexamethasone (DECADRON) 4 MG tablet; Take 1 tab 3 x /day for 2 days, then 2 x /day for 2  Days,  then 1 tab daily  Dispense: 13 tablet; Refill: 0  2. Other secondary osteoarthritis of multiple sites  - dexamethasone (DECADRON) injection 10 mg - dexamethasone (DECADRON) 4 MG tablet; Take 1 tab 3 x /day for 2 days, then 2 x /day for 2  Days,  then 1 tab daily  Dispense: 13 tablet; Refill: 0  3. Limited joint range of motion (ROM)  - dexamethasone (DECADRON) injection 10 mg - dexamethasone (DECADRON) 4 MG tablet; Take 1 tab 3 x /day for 2 days, then 2 x /day for 2  Days,  then 1 tab daily  Dispense: 13 tablet; Refill: 0   Notify office for further evaluation and treatment, questions or concerns if s/s fail to improve. The risks and benefits of my recommendations, as well as other treatment options were discussed with the patient today. Questions were answered.  Further disposition pending results of labs. Discussed med's effects and SE's.    Over 20 minutes of exam, counseling, chart review, and critical decision making was performed.   Future Appointments  Date Time Provider Department Center  11/04/2022  2:15 PM [place], [place], [place] [facility]  03/14/2023  3:00 PM Raynelle Dick, NP [facility] None    ------------------------------------------------------------------------------------------------------------------   HPI BP 124/72   Pulse 88   Temp 97.7 F (36.5 C)   Ht 5\' 7"  (1.702 m)   Wt (!) 331 lb 9.6 oz (150.4 kg)   SpO2 99%   BMI 51.94 kg/m   Patient presents for evaluation of low back problems.  She has a hx of DJD.  Reports receiving steroid  injections to lower back several years ago when following orthopedics however has not recently needed.  She also has OA of  multiple joint sites including BLE, specifically knees.  Symptoms have been present for 3 weeks and include numbness in RLE, pain in right gluteal region that radiates down RLE (aching, burning, dull, numbing, sharp, shooting, stabbing, throbbing, and tingling in character; 10/10 in severity), and stiffness in RLE . Initial inciting event:  unknown . Symptoms are worse  all throughout the day . Alleviating factors identifiable by the patient are none. Aggravating factors identifiable by the patient are bending backwards, bending forwards, bending sideways, sitting, and standing. Treatments initiated by the patient:  Aleve and Tylenol Arthritis.  Does not wish to continue NSAID d/t recent colonoscopy revealing ulcerated colon  . She denies any recent injury, fall or trauma.   Past Medical History:  Diagnosis Date   Anemia    DDD (degenerative disc disease)    DJD (degenerative joint disease)    Elevated hemoglobin A1c    GERD (gastroesophageal reflux disease)    Hyperlipidemia    Hypertension    Obesity    Sickle cell trait (HCC)      Allergies  Allergen Reactions   Ace Inhibitors     Cough   Losartan    Paxil [Paroxetine Hcl]     Mood swings   Prednisone     Nausea/ irratated   Vitamin D Analogs  Cramping    Current Outpatient Medications on File Prior to Visit  Medication Sig   acetaminophen (TYLENOL) 500 MG tablet Take 500 mg by mouth every 6 (six) hours as needed.   allopurinol (ZYLOPRIM) 300 MG tablet TAKE 1 TABLET DAILY TO PREVENT GOUT   aspirin 81 MG tablet Take 81 mg by mouth daily.   bumetanide (BUMEX) 1 MG tablet Take 1 tablet (1 mg total) by mouth as needed.   doxazosin (CARDURA) 8 MG tablet TAKE 1 TABLET AT BEDTIME FOR BLOOD PRESSURE   gabapentin (NEURONTIN) 600 MG tablet TAKE 1/2 TO 1 TABLET 3 X /DAY AS NEEDED FOR CHRONIC PAIN   Vitamin D,  Ergocalciferol, (DRISDOL) 1.25 MG (50000 UNIT) CAPS capsule 1 pill once a week for vitamin d deficiency   No current facility-administered medications on file prior to visit.    ROS: all negative except what is noted in the HPI.   Physical Exam:  BP 124/72   Pulse 88   Temp 97.7 F (36.5 C)   Ht 5\' 7"  (1.702 m)   Wt (!) 331 lb 9.6 oz (150.4 kg)   SpO2 99%   BMI 51.94 kg/m   General Appearance: NAD.  Awake, conversant and cooperative. Eyes: PERRLA, EOMs intact.  Sclera white.  Conjunctiva without erythema. Sinuses: No frontal/maxillary tenderness.  No nasal discharge. Nares patent.  ENT/Mouth: Ext aud canals clear.  Bilateral TMs w/DOL and without erythema or bulging. Hearing intact.  Posterior pharynx without swelling or exudate.  Tonsils without swelling or erythema.  Neck: Supple.  No masses, nodules or thyromegaly. Respiratory: Effort is regular with non-labored breathing. Breath sounds are equal bilaterally without rales, rhonchi, wheezing or stridor.  Cardio: RRR with no MRGs. Brisk peripheral pulses without edema.  Abdomen: Active BS in all four quadrants.  Soft and non-tender without guarding, rebound tenderness, hernias or masses. Lymphatics: Non tender without lymphadenopathy.  Musculoskeletal: LROM in back and RLE d/t pain. 4/5 strength, favors right side during ambulation.  No clubbing or cyanosis. Skin: Appropriate color for ethnicity. Warm without rashes, lesions, ecchymosis, ulcers.  Neuro: CN II-XII grossly normal. Normal muscle tone without cerebellar symptoms and intact sensation.   Psych: AO X 3,  appropriate mood and affect, insight and judgment.     Adela Glimpse, NP 2:57 PM [facility]

## 2022-11-02 NOTE — Patient Instructions (Signed)
Sciatica  Sciatica is pain, weakness, tingling, or loss of feeling (numbness) along the sciatic nerve. The sciatic nerve starts in the lower back and goes down the back of each leg. Sciatica usually affects one side of the body. Sciatica usually goes away on its own or with treatment. Sometimes, sciatica may come back. What are the causes? This condition happens when the sciatic nerve is pinched or has pressure put on it. This may be caused by: A disk in between the bones of the spine bulging out too far (herniated disk). Changes in the spinal disks due to aging. A condition that affects a muscle in the butt. Extra bone growth near the sciatic nerve. A break (fracture) of the area between your hip bones (pelvis). Pregnancy. Tumor. This is rare. What increases the risk? You are more likely to develop this condition if you: Play sports that put pressure or stress on the spine. Have poor strength and ease of movement (flexibility). Have had a back injury or back surgery. Sit for long periods of time. Do activities that involve bending or lifting over and over again. Are very overweight (obese). What are the signs or symptoms? Symptoms can vary from mild to very bad. They may include: Any of these problems in the lower back, leg, hip, or butt: Mild tingling, loss of feeling, or dull aches. A burning feeling. Sharp pains. Loss of feeling in the back of the calf or the sole of the foot. Leg weakness. Very bad back pain that makes it hard to move. These symptoms may get worse when you cough, sneeze, or laugh. They may also get worse when you sit or stand for long periods of time. How is this treated? This condition often gets better without any treatment. However, treatment may include: Changing or cutting back on physical activity when you have pain. Exercising, including strengthening and stretching. Putting ice or heat on the affected area. Shots of medicines to relieve pain and  swelling or to relax your muscles. Surgery. Follow these instructions at home: Medicines Take over-the-counter and prescription medicines only as told by your doctor. Ask your doctor if you should avoid driving or using machines while you are taking your medicine. Managing pain     If told, put ice on the affected area. To do this: Put ice in a plastic bag. Place a towel between your skin and the bag. Leave the ice on for 20 minutes, 2-3 times a day. If your skin turns bright red, take off the ice right away to prevent skin damage. The risk of skin damage is higher if you cannot feel pain, heat, or cold. If told, put heat on the affected area. Do this as often as told by your doctor. Use the heat source that your doctor tells you to use, such as a moist heat pack or a heating pad. Place a towel between your skin and the heat source. Leave the heat on for 20-30 minutes. If your skin turns bright red, take off the heat right away to prevent burns. The risk of burns is higher if you cannot feel pain, heat, or cold. Activity  Return to your normal activities when your doctor says that it is safe. Avoid activities that make your symptoms worse. Take short rests during the day. When you rest for a long time, do some physical activity or stretching between periods of rest. Avoid sitting for a long time without moving. Get up and move around at least one time each   hour. Do exercises and stretches as told by your doctor. Do not lift anything that is heavier than 10 lb (4.5 kg). Avoid lifting heavy things even when you do not have symptoms. Avoid lifting heavy things over and over. When you lift objects, always lift in a way that is safe for your body. To do this, you should: Bend your knees. Keep the object close to your body. Avoid twisting. General instructions Stay at a healthy weight. Wear comfortable shoes that support your feet. Avoid wearing high heels. Avoid sleeping on a mattress  that is too soft or too hard. You might have less pain if you sleep on a mattress that is firm enough to support your back. Contact a doctor if: Your pain is not controlled by medicine. Your pain does not get better. Your pain gets worse. Your pain lasts longer than 4 weeks. You lose weight without trying. Get help right away if: You cannot control when you pee (urinate) or poop (have a bowel movement). You have weakness in any of these areas and it gets worse: Lower back. The area between your hip bones. Butt. Legs. You have redness or swelling of your back. You have a burning feeling when you pee. Summary Sciatica is pain, weakness, tingling, or loss of feeling (numbness) along the sciatic nerve. This may include the lower back, legs, hips, and butt. This condition happens when the sciatic nerve is pinched or has pressure put on it. Treatment often includes rest, exercise, medicines, and putting ice or heat on the affected area. This information is not intended to replace advice given to you by your health care provider. Make sure you discuss any questions you have with your health care provider. Document Revised: 08/17/2021 Document Reviewed: 08/17/2021 Elsevier Patient Education  2024 [employer]  

## 2022-11-04 ENCOUNTER — Ambulatory Visit: Payer: 59 | Admitting: Podiatry

## 2022-11-04 ENCOUNTER — Encounter: Payer: Self-pay | Admitting: Podiatry

## 2022-11-04 DIAGNOSIS — D2371 Other benign neoplasm of skin of right lower limb, including hip: Secondary | ICD-10-CM | POA: Diagnosis not present

## 2022-11-04 DIAGNOSIS — D2372 Other benign neoplasm of skin of left lower limb, including hip: Secondary | ICD-10-CM

## 2022-11-04 DIAGNOSIS — B351 Tinea unguium: Secondary | ICD-10-CM | POA: Diagnosis not present

## 2022-11-04 DIAGNOSIS — M79676 Pain in unspecified toe(s): Secondary | ICD-10-CM | POA: Diagnosis not present

## 2022-11-04 NOTE — Progress Notes (Signed)
She presents today chief complaint of painful elongated toenails and calluses to the fifth digits bilaterally.  She states that I think the injection worked last time because it really took care of the swelling.  Objective: Vital signs stable she is alert and oriented x 3 there is no erythema edema cellulitis drainage or odor.  Toenails are long thick yellow dystrophic onychomycotic painful palpation as well as debridement.  Debrided benign skin lesion fifth digit bilateral.  Plan: Discussed etiology pathology conservative surgical therapies.  Debrided toenails 1 through 5 bilateral.  Debrided benign skin lesions.

## 2023-02-01 ENCOUNTER — Encounter: Payer: 59 | Admitting: Nurse Practitioner

## 2023-02-03 ENCOUNTER — Ambulatory Visit: Payer: 59 | Admitting: Podiatry

## 2023-02-03 ENCOUNTER — Encounter: Payer: Self-pay | Admitting: Podiatry

## 2023-02-03 DIAGNOSIS — D2371 Other benign neoplasm of skin of right lower limb, including hip: Secondary | ICD-10-CM | POA: Diagnosis not present

## 2023-02-03 DIAGNOSIS — D2372 Other benign neoplasm of skin of left lower limb, including hip: Secondary | ICD-10-CM | POA: Diagnosis not present

## 2023-02-03 DIAGNOSIS — M79676 Pain in unspecified toe(s): Secondary | ICD-10-CM | POA: Diagnosis not present

## 2023-02-03 DIAGNOSIS — B351 Tinea unguium: Secondary | ICD-10-CM | POA: Diagnosis not present

## 2023-02-06 NOTE — Progress Notes (Signed)
Presents today chief complaint of painful elongated toenails and calluses.  Objective: Also stable oriented x 3.  Pulses are palpable.  Toenails are long thick yellow dystrophic onychomycotic some benign skin lesions plantar aspect of the forefoot bilateral.  Assessment: Pain limb secondary to onychomycosis nail dystrophy and benign skin lesion.  Plan: Debridement of nails and debridement of benign skin lesions.  Follow-up with her in 3 months

## 2023-02-10 ENCOUNTER — Encounter: Payer: 59 | Admitting: Nurse Practitioner

## 2023-02-20 ENCOUNTER — Other Ambulatory Visit: Payer: Self-pay | Admitting: Internal Medicine

## 2023-03-14 ENCOUNTER — Ambulatory Visit (INDEPENDENT_AMBULATORY_CARE_PROVIDER_SITE_OTHER): Payer: 59 | Admitting: Nurse Practitioner

## 2023-03-14 ENCOUNTER — Encounter: Payer: Self-pay | Admitting: Nurse Practitioner

## 2023-03-14 VITALS — BP 137/84 | HR 76 | Temp 97.7°F | Ht 67.0 in | Wt 316.0 lb

## 2023-03-14 DIAGNOSIS — E782 Mixed hyperlipidemia: Secondary | ICD-10-CM

## 2023-03-14 DIAGNOSIS — Z79899 Other long term (current) drug therapy: Secondary | ICD-10-CM

## 2023-03-14 DIAGNOSIS — Z1389 Encounter for screening for other disorder: Secondary | ICD-10-CM

## 2023-03-14 DIAGNOSIS — M109 Gout, unspecified: Secondary | ICD-10-CM

## 2023-03-14 DIAGNOSIS — G8929 Other chronic pain: Secondary | ICD-10-CM

## 2023-03-14 DIAGNOSIS — Z136 Encounter for screening for cardiovascular disorders: Secondary | ICD-10-CM | POA: Diagnosis not present

## 2023-03-14 DIAGNOSIS — R7309 Other abnormal glucose: Secondary | ICD-10-CM

## 2023-03-14 DIAGNOSIS — I10 Essential (primary) hypertension: Secondary | ICD-10-CM | POA: Diagnosis not present

## 2023-03-14 DIAGNOSIS — Z0001 Encounter for general adult medical examination with abnormal findings: Secondary | ICD-10-CM

## 2023-03-14 DIAGNOSIS — E559 Vitamin D deficiency, unspecified: Secondary | ICD-10-CM

## 2023-03-14 DIAGNOSIS — Z1329 Encounter for screening for other suspected endocrine disorder: Secondary | ICD-10-CM

## 2023-03-14 DIAGNOSIS — Z0000 Encounter for general adult medical examination without abnormal findings: Secondary | ICD-10-CM

## 2023-03-14 DIAGNOSIS — D573 Sickle-cell trait: Secondary | ICD-10-CM

## 2023-03-14 DIAGNOSIS — R197 Diarrhea, unspecified: Secondary | ICD-10-CM

## 2023-03-14 NOTE — Patient Instructions (Signed)

## 2023-03-14 NOTE — Progress Notes (Signed)
COMPLETE PHYSICAL  Assessment and Plan:   Encounter for general adult medical examination with abnormal findings Mammogram UTD- 06/14/22   Sickle cell trait (HCC) Monitor  Morbid obesity (HCC) - follow up 3 months for progress monitoring - increase veggies, decrease carbs - long discussion about weight loss, diet, and exercise  Anemia due to blood loss Monitor  Abnormal glucose -     Hemoglobin A1c Discussed disease progression and risks Discussed diet/exercise, weight management and risk modification  Hyperlipidemia, mixed -     Lipid panel check lipids decrease fatty foods increase activity.   Essential hypertension - continue medications, DASH diet, exercise and encourage monitoring at home. Call if greater than 130/80.  -     CBC with Differential/Platelet -     COMPLETE METABOLIC PANEL WITH GFR -     TSH -     Urinalysis, Routine w reflex microscopic -     Microalbumin / creatinine urine ratio -     EKG 12-Lead  Vitamin D deficiency -     VITAMIN D 25 Hydroxy (Vit-D Deficiency, Fractures)  Idiopathic gout, unspecified chronicity, unspecified site Continue on allopurinol, no recent flares   Chronic pain of both knees -     Has seen Orthopedic but in arrears, had been recommended knee replacement - Continue to use icy hot with lidocaine and Tylenol  Edema Continue Bumetanide Keep feet elevated, limit sodium intake   Medication management -     CBC with Differential/Platelet -     COMPLETE METABOLIC PANEL WITH GFR -     Magnesium -     Lipid panel -     TSH -     Hemoglobin A1C w/out eAG -     VITAMIN D 25 Hydroxy (Vit-D Deficiency, Fractures) -     EKG 12-Lead -     Urinalysis, Routine w reflex microscopic -     Microalbumin / creatinine urine ratio  Diarrhea Monitor symptoms, if worsen notify the office and will refer to GI.   Screening for ischemic heart disease -     EKG 12-Lead  Screening for hematuria or proteinuria -     Urinalysis,  Routine w reflex microscopic -     Microalbumin / creatinine urine ratio  Screening for thyroid disorder -     TSH    Continue diet and meds as discussed. Further disposition pending results of labs. Discussed med's effects and SE's.   Over 30 minutes of exam, counseling, chart review, and critical decision making was performed.   Future Appointments  Date Time Provider Department Center  05/05/2023  2:15 PM [place], [place] [facility]  03/13/2024  3:00 PM Raynelle Dick, NP [facility] None    ----------------------------------------------------------------------------------------------------------------------  HPI 67 y.o. female  presents for complete physical on hypertension, cholesterol, history of prediabetes, morbid obesity (BMI 50+) and vitamin D deficiency.   Allergies have started to bother her.  Lots of sneezing, eyes watering.   She has bilateral knee OA, takes tylenol as needed for pain but is not providing relief.  She has been advised she will need a knee replacement but is currently in arrears at Orthopedic office so has not gone back for further treatment .  BMI is Body mass index is 49.49 kg/m., she has not been working on diet and exercise. . Not currently checking weights, needs new scale. She has lost 15 pounds in the past 4 months. She has been decreasing portion size.  Wt Readings from  Last 3 Encounters:  03/14/23 (!) 316 lb (143.3 kg)  11/02/22 (!) 331 lb 9.6 oz (150.4 kg)  09/13/22 (!) 317 lb 9.6 oz (144.1 kg)   She has diarrhea within several hours after eating tomato products, chocolate, dairy and salad. Will have diarrhea the next day. Then will return to normal  BP is well controlled on cardura 8 mg every day and bumetanide 1 mg PRN for edema, today their BP is BP: 137/84  BP Readings from Last 3 Encounters:  03/14/23 137/84  11/02/22 124/72  09/13/22 126/84  She does not workout. She denies chest pain, shortness of breath,  dizziness.   She is not on cholesterol medication and denies myalgias. Her cholesterol is not at goal. The cholesterol last visit was:   Lab Results  Component Value Date   CHOL 163 09/13/2022   HDL 56 09/13/2022   LDLCALC 93 09/13/2022   TRIG 57 09/13/2022   CHOLHDL 2.9 09/13/2022    She has not been working on diet and exercise for history of prediabetes, and denies increased appetite, nausea, paresthesia of the feet, polydipsia, polyuria, visual disturbances, vomiting and weight loss. She is limiting simple carbs. Last A1C in the office was:  Lab Results  Component Value Date   HGBA1C 5.7 (H) 09/13/2022   Patient is not on Vitamin D supplement and remains very low at last check:   Lab Results  Component Value Date   VD25OH 10 (L) 09/13/2022     Patient is on allopurinol for gout and does not report a recent flare.  Lab Results  Component Value Date   LABURIC 4.7 03/10/2022   She has tried to drink more water.  Lab Results  Component Value Date   EGFR 62 09/13/2022       Current Medications:   Current Outpatient Medications (Endocrine & Metabolic):    dexamethasone (DECADRON) 4 MG tablet, Take 1 tab 3 x /day for 2 days, then 2 x /day for 2  Days,  then 1 tab daily (Patient not taking: Reported on 03/14/2023)  Current Facility-Administered Medications (Endocrine & Metabolic):    dexamethasone (DECADRON) injection 10 mg  Current Outpatient Medications (Cardiovascular):    bumetanide (BUMEX) 1 MG tablet, Take 1 tablet (1 mg total) by mouth as needed.   doxazosin (CARDURA) 8 MG tablet, TAKE 1 TABLET AT BEDTIME FOR BLOOD PRESSURE     Current Outpatient Medications (Analgesics):    Acetaminophen (TYLENOL ARTHRITIS PAIN PO), Take by mouth.   allopurinol (ZYLOPRIM) 300 MG tablet, TAKE 1 TABLET DAILY TO PREVENT GOUT   aspirin 81 MG tablet, Take 81 mg by mouth daily.   acetaminophen (TYLENOL) 500 MG tablet, Take 500 mg by mouth every 6 (six) hours as needed. (Patient not  taking: Reported on 03/14/2023)     Current Outpatient Medications (Other):    gabapentin (NEURONTIN) 600 MG tablet, TAKE 1/2 TO 1 TABLET 3 X /DAY AS NEEDED FOR CHRONIC PAIN   Vitamin D, Ergocalciferol, (DRISDOL) 1.25 MG (50000 UNIT) CAPS capsule, 1 pill once a week for vitamin d deficiency (Patient not taking: Reported on 03/14/2023)    Allergies:  Allergies  Allergen Reactions   Ace Inhibitors     Cough   Losartan    Paxil [Paroxetine Hcl]     Mood swings   Prednisone     Nausea/ irratated   Vitamin D Analogs     Cramping     Medical History:  Past Medical History:  Diagnosis Date   Anemia  DDD (degenerative disc disease)    DJD (degenerative joint disease)    Elevated hemoglobin A1c    GERD (gastroesophageal reflux disease)    Hyperlipidemia    Hypertension    Obesity    Sickle cell trait (HCC)    Immunization History  Administered Date(s) Administered   PFIZER(Purple Top)SARS-COV-2 Vaccination 08/22/2019, 09/12/2019, 06/05/2020   PPD Test 05/02/2013, 06/18/2014, 07/27/2016, 08/22/2017, 09/12/2018   Td 07/01/2015   Health Maintenance  Topic Date Due   Zoster Vaccines- Shingrix (1 of 2) Never done   Pneumonia Vaccine 56+ Years old (1 of 1 - PCV) Never done   INFLUENZA VACCINE  Never done   COVID-19 Vaccine (4 - 2023-24 season) 01/23/2023   MAMMOGRAM  06/15/2023   DTaP/Tdap/Td (2 - Tdap) 06/30/2025   Colonoscopy  02/26/2030   DEXA SCAN  Completed   Hepatitis C Screening  Completed   HPV VACCINES  Aged Out   Eye Exam- overdue , pt aware she should schedule PAP DR. HENLEY 2020, needs to schedule DEXA 10/21 normal MGM 10/21 negative Colonoscopy 02/2020- no significant pathologic changes, F/U 5 years suggested PREVNAR: WANTS TO WAIT  SURGICAL HISTORY She  has a past surgical history that includes Shoulder surgery (Left, 2007). FAMILY HISTORY Her family history includes Cancer in her father; Diabetes in her brother and sister; Heart disease in her  mother; Mental illness in her sister; Multiple myeloma in her father; Obstructive Sleep Apnea in her brother. SOCIAL HISTORY She  reports that she has never smoked. She has never used smokeless tobacco. She reports current alcohol use of about 6.0 standard drinks of alcohol per week.   Review of Systems:  Review of Systems  Constitutional:  Negative for chills, fever, malaise/fatigue and weight loss.  HENT:  Negative for congestion, ear discharge, hearing loss, sore throat and tinnitus.   Eyes:  Negative for blurred vision, double vision, discharge and redness.       Floater in right eye, has been several years  Respiratory:  Negative for cough, shortness of breath and wheezing.   Cardiovascular:  Positive for leg swelling (feet bilaterally). Negative for chest pain, palpitations, orthopnea and claudication.  Gastrointestinal:  Positive for diarrhea (if eats tomato products, chocolate or lettuce). Negative for abdominal pain, blood in stool, constipation, heartburn, melena, nausea and vomiting.  Genitourinary: Negative.  Negative for dysuria, frequency and urgency.  Musculoskeletal:  Positive for joint pain (right knee). Negative for back pain, falls and myalgias.  Skin:  Negative for rash.  Neurological:  Negative for dizziness, tingling, sensory change, seizures, weakness and headaches.  Endo/Heme/Allergies:  Negative for polydipsia. Does not bruise/bleed easily.  Psychiatric/Behavioral:  Negative for depression, hallucinations, memory loss and suicidal ideas. The patient has insomnia (wakes up frequently). The patient is not nervous/anxious.   All other systems reviewed and are negative.   Physical Exam: BP 137/84   Pulse 76   Temp 97.7 F (36.5 C)   Ht 5\' 7"  (1.702 m)   Wt (!) 316 lb (143.3 kg)   SpO2 95%   BMI 49.49 kg/m  Wt Readings from Last 3 Encounters:  03/14/23 (!) 316 lb (143.3 kg)  11/02/22 (!) 331 lb 9.6 oz (150.4 kg)  09/13/22 (!) 317 lb 9.6 oz (144.1 kg)    General Appearance: Morbidly obese pleasant female, in no apparent distress. Eyes: PERRLA, EOMs, conjunctiva no swelling or erythema Sinuses: No Frontal/maxillary tenderness ENT/Mouth: Ext aud canals clear, TMs without erythema, bulging. No erythema, swelling, or exudate on post pharynx.  Tonsils not swollen or erythematous. Hearing normal.  Neck: Supple, thyroid normal.  Respiratory: Respiratory effort normal, BS equal bilaterally without rales, rhonchi, wheezing or stridor.  Cardio: Distant heart sounds due to body habitus; RRR with no audible MRGs. Brisk peripheral pulses with 1+ pitting edema to bilateral ankles L>R Abdomen: Soft, obese abdomen, + BS.  Non tender, no guarding, rebound, hernias, masses. Lymphatics: Non tender without lymphadenopathy.  Musculoskeletal: Full ROM except R leg is slightly decreased, 5/5 strength, Normal gait Skin: Warm, dry without rashes, lesions, ecchymosis.  Neuro: Cranial nerves intact. No cerebellar symptoms.  Psych: Awake and oriented X 3, normal affect, Insight and Judgment appropriate.  EKG- NSR, IRBBB, no ST changes   Tommie Bohlken Hollie Salk, NP 3:30 PM [facility]

## 2023-03-15 LAB — MAGNESIUM: Magnesium: 2 mg/dL (ref 1.5–2.5)

## 2023-03-15 LAB — COMPLETE METABOLIC PANEL WITH GFR
AG Ratio: 1.1 (calc) (ref 1.0–2.5)
ALT: 10 U/L (ref 6–29)
AST: 13 U/L (ref 10–35)
Albumin: 3.9 g/dL (ref 3.6–5.1)
Alkaline phosphatase (APISO): 113 U/L (ref 37–153)
BUN: 20 mg/dL (ref 7–25)
CO2: 27 mmol/L (ref 20–32)
Calcium: 9.1 mg/dL (ref 8.6–10.4)
Chloride: 104 mmol/L (ref 98–110)
Creat: 0.83 mg/dL (ref 0.50–1.05)
Globulin: 3.4 g/dL (ref 1.9–3.7)
Glucose, Bld: 103 mg/dL — ABNORMAL HIGH (ref 65–99)
Potassium: 4.2 mmol/L (ref 3.5–5.3)
Sodium: 140 mmol/L (ref 135–146)
Total Bilirubin: 1 mg/dL (ref 0.2–1.2)
Total Protein: 7.3 g/dL (ref 6.1–8.1)
eGFR: 77 mL/min/{1.73_m2} (ref >=60)

## 2023-03-15 LAB — MICROSCOPIC MESSAGE

## 2023-03-15 LAB — URINALYSIS, ROUTINE W REFLEX MICROSCOPIC
Bilirubin Urine: NEGATIVE
Glucose, UA: NEGATIVE
Hyaline Cast: NONE SEEN /[LPF]
Ketones, ur: NEGATIVE
Nitrite: NEGATIVE
Protein, ur: NEGATIVE
Specific Gravity, Urine: 1.016 (ref 1.001–1.035)
WBC, UA: >=60 /[HPF] — AB (ref 0–5)
pH: 5.5 (ref 5.0–8.0)

## 2023-03-15 LAB — LIPID PANEL
Cholesterol: 179 mg/dL (ref <200)
HDL: 68 mg/dL (ref >=50)
LDL Cholesterol (Calc): 96 mg/dL
Non-HDL Cholesterol (Calc): 111 mg/dL (ref <130)
Total CHOL/HDL Ratio: 2.6 (calc) (ref <5.0)
Triglycerides: 68 mg/dL (ref <150)

## 2023-03-15 LAB — CBC WITH DIFFERENTIAL/PLATELET
Absolute Lymphocytes: 2149 {cells}/uL (ref 850–3900)
Absolute Monocytes: 578 {cells}/uL (ref 200–950)
Basophils Absolute: 41 {cells}/uL (ref 0–200)
Basophils Relative: 0.6 %
Eosinophils Absolute: 129 {cells}/uL (ref 15–500)
Eosinophils Relative: 1.9 %
HCT: 36.6 % (ref 35.0–45.0)
Hemoglobin: 11.9 g/dL (ref 11.7–15.5)
MCH: 29.1 pg (ref 27.0–33.0)
MCHC: 32.5 g/dL (ref 32.0–36.0)
MCV: 89.5 fL (ref 80.0–100.0)
MPV: 9.9 fL (ref 7.5–12.5)
Monocytes Relative: 8.5 %
Neutro Abs: 3903 {cells}/uL (ref 1500–7800)
Neutrophils Relative %: 57.4 %
Platelets: 229 10*3/uL (ref 140–400)
RBC: 4.09 10*6/uL (ref 3.80–5.10)
RDW: 12.5 % (ref 11.0–15.0)
Total Lymphocyte: 31.6 %
WBC: 6.8 10*3/uL (ref 3.8–10.8)

## 2023-03-15 LAB — VITAMIN D 25 HYDROXY (VIT D DEFICIENCY, FRACTURES): Vit D, 25-Hydroxy: 7 ng/mL — ABNORMAL LOW (ref 30–100)

## 2023-03-15 LAB — HEMOGLOBIN A1C W/OUT EAG: Hgb A1c MFr Bld: 5.6 %{Hb} (ref <5.7)

## 2023-03-15 LAB — TSH: TSH: 2.73 m[IU]/L (ref 0.40–4.50)

## 2023-03-15 LAB — MICROALBUMIN / CREATININE URINE RATIO
Creatinine, Urine: 93 mg/dL (ref 20–275)
Microalb Creat Ratio: 12 mg/g{creat} (ref <30)
Microalb, Ur: 1.1 mg/dL

## 2023-04-12 IMAGING — MG MM DIGITAL SCREENING BILAT W/ TOMO AND CAD
8 of 17 series · 8 of 40 positions shown · non-contrast
Comparison: Previous exam(s).

ACR Breast Density Category a: The breast tissue is almost entirely
fatty.

CLINICAL DATA: Screening.

EXAM:
DIGITAL SCREENING BILATERAL MAMMOGRAM WITH TOMOSYNTHESIS AND CAD
TECHNIQUE: Bilateral screening digital craniocaudal and mediolateral oblique
mammograms were obtained. Bilateral screening digital breast
tomosynthesis was performed. The images were evaluated with
computer-aided detection.

[R CC synth-2D (1 of 3)]
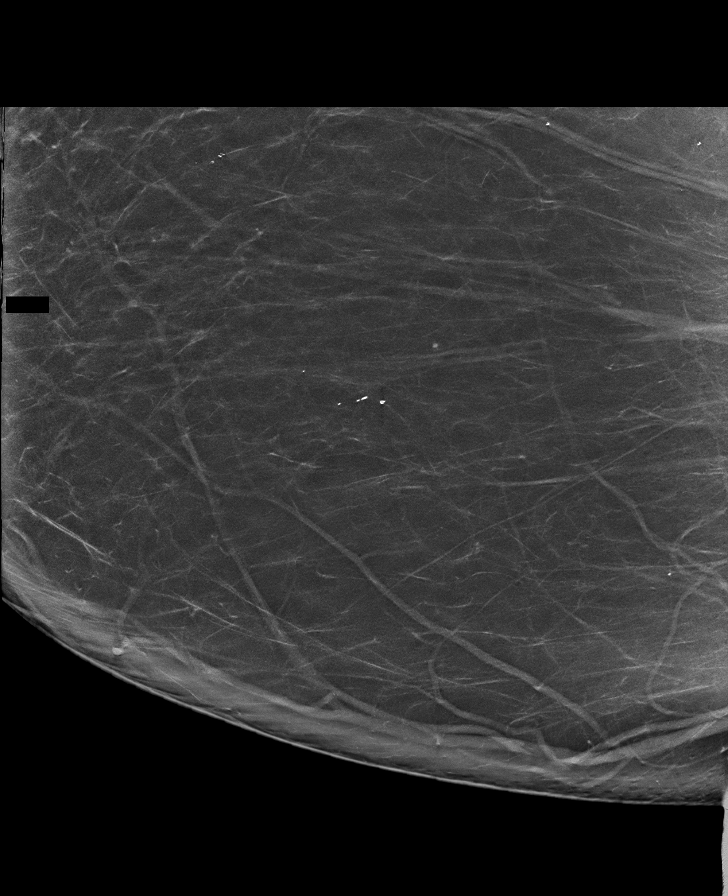

[L MLO synth-2D (1 of 3)]
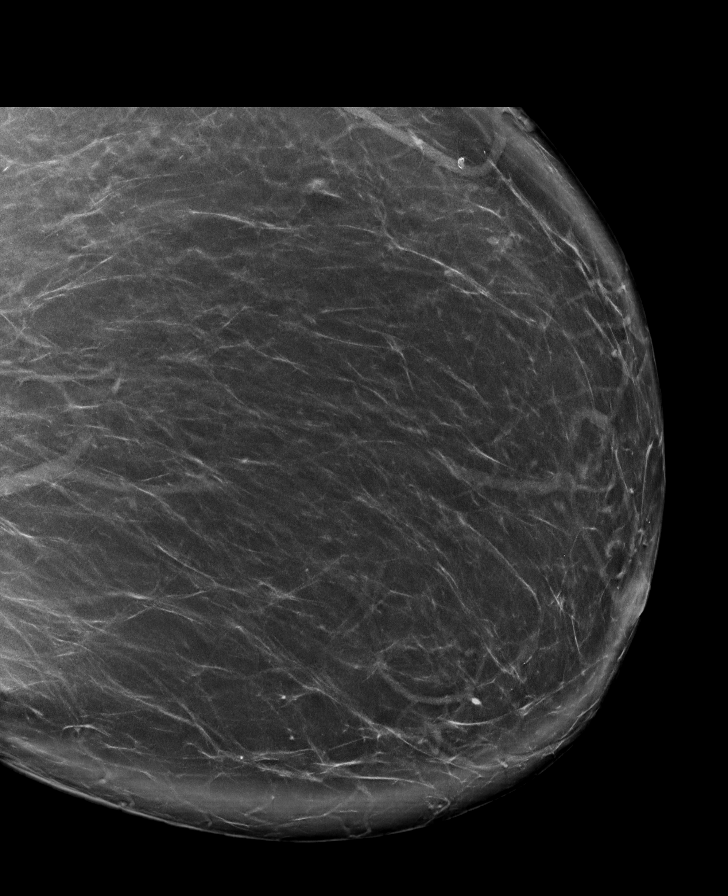

[R CC synth-2D (2 of 3)]
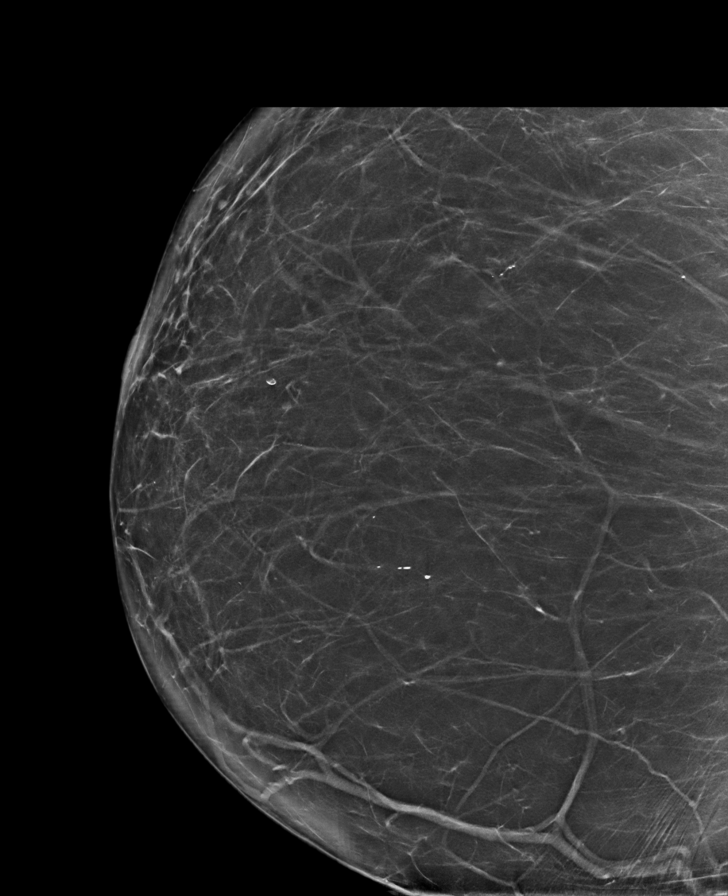

[R CC synth-2D (3 of 3)]
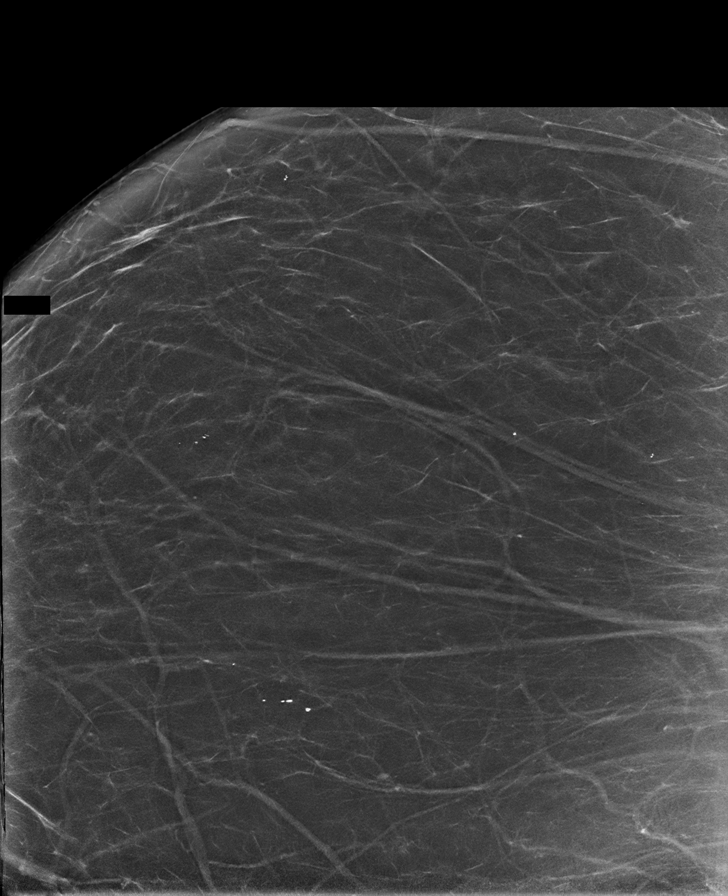

[L CC synth-2D (1 of 2)]
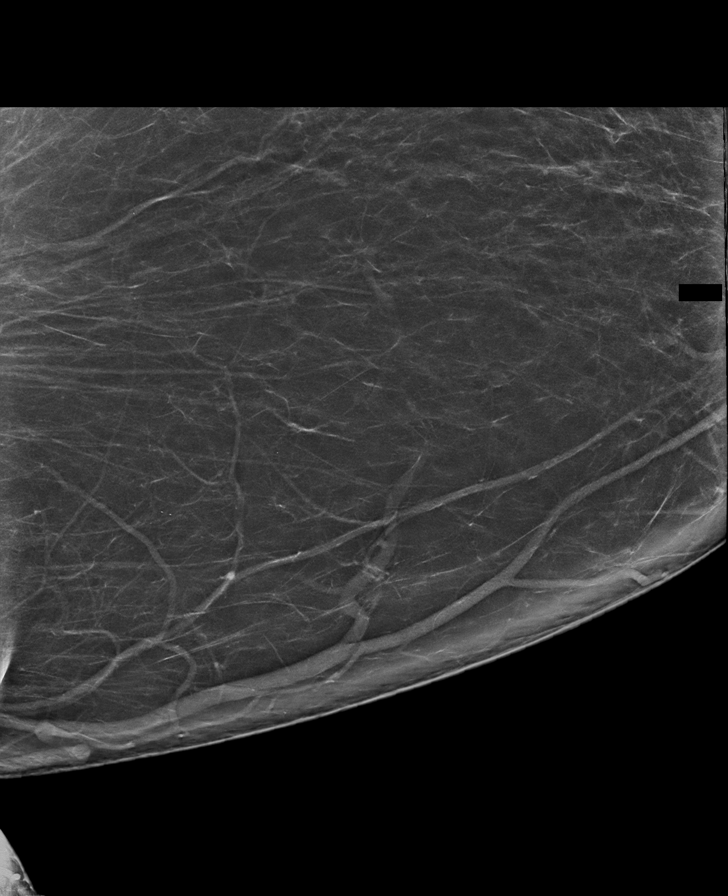

[L MLO synth-2D (2 of 3)]
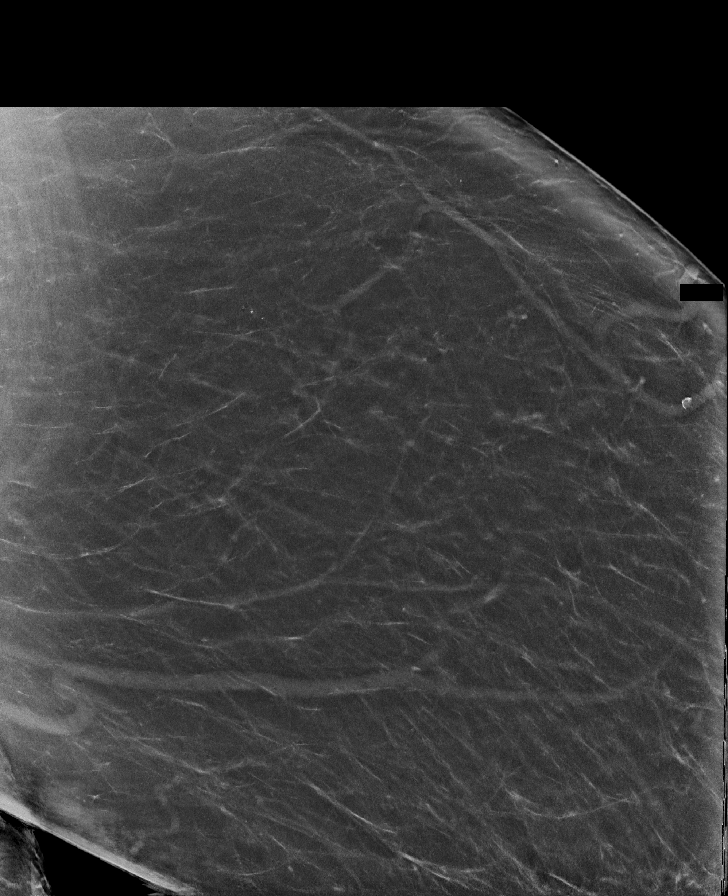

[L CC synth-2D (2 of 2)]
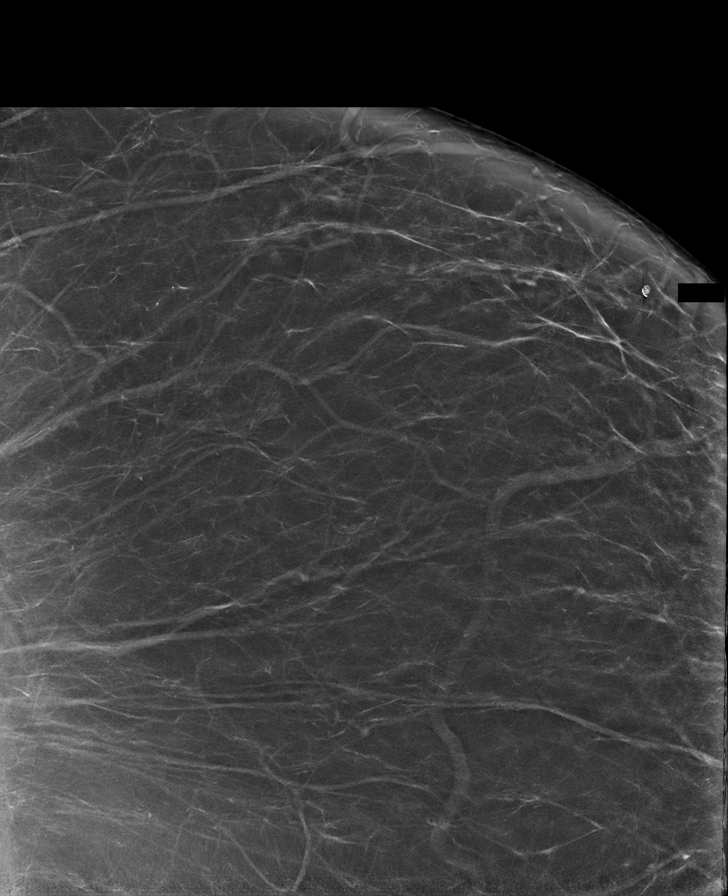

[L MLO synth-2D (3 of 3)]
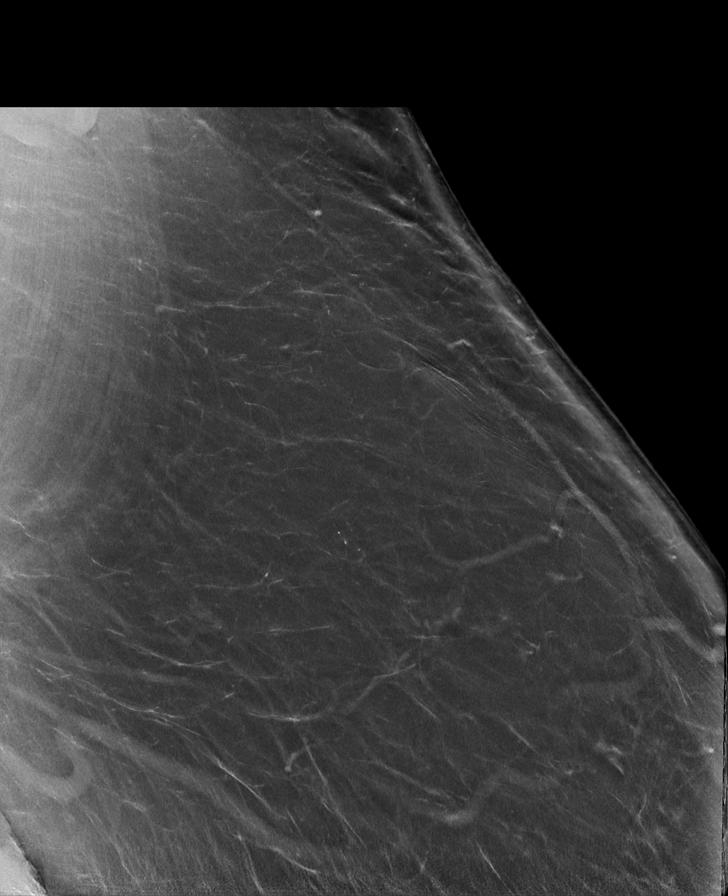

[8 of 40 positions shown; findings below may reference images not displayed]

FINDINGS: There are no findings suspicious for malignancy.
IMPRESSION: No mammographic evidence of malignancy. A result letter of this
screening mammogram will be mailed directly to the patient.

RECOMMENDATION:
Screening mammogram in one year. (Code:0E-3-N98)

BI-RADS CATEGORY  1: Negative.

## 2023-05-05 ENCOUNTER — Ambulatory Visit: Payer: 59 | Admitting: Podiatry

## 2023-05-05 ENCOUNTER — Telehealth: Payer: Self-pay | Admitting: Podiatry

## 2023-05-05 NOTE — Telephone Encounter (Signed)
Left message for pt apologizing for the wait for today's appt and asked pt to call me directly so I can get her worked in next Tuesday per [employer].

## 2023-05-10 ENCOUNTER — Encounter: Payer: Self-pay | Admitting: Podiatry

## 2023-05-10 ENCOUNTER — Ambulatory Visit: Payer: 59 | Admitting: Podiatry

## 2023-05-10 DIAGNOSIS — B351 Tinea unguium: Secondary | ICD-10-CM | POA: Diagnosis not present

## 2023-05-10 DIAGNOSIS — D2372 Other benign neoplasm of skin of left lower limb, including hip: Secondary | ICD-10-CM

## 2023-05-10 DIAGNOSIS — D2371 Other benign neoplasm of skin of right lower limb, including hip: Secondary | ICD-10-CM | POA: Diagnosis not present

## 2023-05-10 DIAGNOSIS — M79676 Pain in unspecified toe(s): Secondary | ICD-10-CM | POA: Diagnosis not present

## 2023-05-10 NOTE — Progress Notes (Signed)
She presents today chief complaint of painful elongated toenails and calluses.  Objective: Pulses are palpable.  Toenails are long thick yellow dystrophic with mycotic benign skin lesions bilateral forefoot.  Assessment: Pain limb secondary onychomycosis benign skin lesions.  Plan: Debridement of benign skin lesion debridement of mycotic nails.

## 2023-05-13 ENCOUNTER — Other Ambulatory Visit: Payer: Self-pay | Admitting: Internal Medicine

## 2023-05-26 ENCOUNTER — Ambulatory Visit: Payer: 59 | Admitting: Podiatry

## 2023-06-15 ENCOUNTER — Other Ambulatory Visit: Payer: Self-pay | Admitting: Internal Medicine

## 2023-06-15 DIAGNOSIS — Z1231 Encounter for screening mammogram for malignant neoplasm of breast: Secondary | ICD-10-CM

## 2023-07-01 ENCOUNTER — Ambulatory Visit
Admission: RE | Admit: 2023-07-01 | Discharge: 2023-07-01 | Disposition: A | Discharge status: Home / Self Care | Payer: 59 | Source: Ambulatory Visit | Attending: Internal Medicine | Admitting: Internal Medicine

## 2023-07-01 DIAGNOSIS — Z1231 Encounter for screening mammogram for malignant neoplasm of breast: Secondary | ICD-10-CM

## 2023-07-05 ENCOUNTER — Encounter: Payer: Self-pay | Admitting: *Deleted

## 2023-08-09 ENCOUNTER — Ambulatory Visit: Payer: 59 | Admitting: *Deleted

## 2023-08-09 DIAGNOSIS — M79676 Pain in unspecified toe(s): Secondary | ICD-10-CM | POA: Diagnosis not present

## 2023-08-09 DIAGNOSIS — D2372 Other benign neoplasm of skin of left lower limb, including hip: Secondary | ICD-10-CM | POA: Diagnosis not present

## 2023-08-09 DIAGNOSIS — D2371 Other benign neoplasm of skin of right lower limb, including hip: Secondary | ICD-10-CM

## 2023-08-09 DIAGNOSIS — B351 Tinea unguium: Secondary | ICD-10-CM

## 2023-08-09 NOTE — Progress Notes (Signed)
 She presents today chief complaint of painful elongated toenails and calluses.  Objective: Pulses are palpable.  Toenails are long thick yellow dystrophic with mycotic benign skin lesions hallux bilateral, 5th left.  Assessment: Pain limb secondary onychomycosis benign skin lesions.  Plan: Debridement of benign skin lesion debridement of mycotic nails.

## 2023-09-13 ENCOUNTER — Ambulatory Visit: Payer: 59 | Admitting: Nurse Practitioner

## 2023-10-11 ENCOUNTER — Ambulatory Visit (INDEPENDENT_AMBULATORY_CARE_PROVIDER_SITE_OTHER): Payer: Self-pay | Admitting: Family

## 2023-10-11 ENCOUNTER — Encounter: Payer: Self-pay | Admitting: Family

## 2023-10-11 VITALS — BP 134/81 | HR 77 | Ht 67.0 in | Wt 336.0 lb

## 2023-10-11 DIAGNOSIS — Z7689 Persons encountering health services in other specified circumstances: Secondary | ICD-10-CM

## 2023-10-11 DIAGNOSIS — I10 Essential (primary) hypertension: Secondary | ICD-10-CM | POA: Diagnosis not present

## 2023-10-11 MED ORDER — DOXAZOSIN MESYLATE 8 MG PO TABS: ORAL_TABLET | ORAL | 0 refills | Status: DC

## 2023-10-11 NOTE — Progress Notes (Signed)
 Subjective:    Alicia Diaz - 68 y.o. female MRN [id number]  Date of birth: March 23, 1956  HPI  [name] is to establish care.   Current issues and/or concerns: - High blood pressure. Doing well on Doxazosin , no issues/concerns. She watches salt intake. She does not exercise. She does not check blood pressure outside of office. She does not complain of red flag symptoms such as but not limited to chest pain, shortness of breath, worst headache of life, nausea/vomiting.    ROS per HPI    Health Maintenance:  Health Maintenance Due  Topic Date Due   Zoster Vaccines- Shingrix (1 of 2) Never done   Pneumonia Vaccine 48+ Years old (1 of 1 - PCV) Never done   COVID-19 Vaccine (4 - 2024-25 season) 01/23/2023     Past Medical History: Patient Active Problem List   Diagnosis Date Noted   Abnormal cervical Papanicolaou smear 09/11/2019   Anemia due to blood loss 09/11/2019   Incontinence of feces 09/11/2019   Morbid obesity (HCC) 09/11/2019   Premature birth of twins 09/11/2019   Strain of abdominal muscle 09/11/2019   PreDiabetes 04/23/2018   Idiopathic gout 06/18/2014   Vitamin D  deficiency 11/21/2013   Rectocele 05/30/2013   Morbid obesity with BMI of 50.0-59.9, adult (HCC) 05/02/2013   Hyperlipidemia, mixed    Essential hypertension    Sickle cell trait (HCC)    Abnormal glucose    DJD (degenerative joint disease)       Social History   reports that she has never smoked. She has never used smokeless tobacco. She reports current alcohol use of about 6.0 standard drinks of alcohol per week. She reports that she does not use drugs.   Family History  family history includes Cancer in her father; Diabetes in her brother and sister; Heart disease in her mother; Mental illness in her sister; Multiple myeloma in her father; Obstructive Sleep Apnea in her brother.   Medications: reviewed and updated   Objective:   Physical Exam BP 134/81   Pulse 77   Ht 5\' 7"  (1.702 m)    Wt (!) 336 lb (152.4 kg)   SpO2 96%   BMI 52.63 kg/m   Physical Exam HENT:     Head: Normocephalic and atraumatic.     Nose: Nose normal.     Mouth/Throat:     Mouth: Mucous membranes are moist.     Pharynx: Oropharynx is clear.  Eyes:     Extraocular Movements: Extraocular movements intact.     Conjunctiva/sclera: Conjunctivae normal.     Pupils: Pupils are equal, round, and reactive to light.  Cardiovascular:     Rate and Rhythm: Normal rate and regular rhythm.     Pulses: Normal pulses.     Heart sounds: Normal heart sounds.  Pulmonary:     Effort: Pulmonary effort is normal.     Breath sounds: Normal breath sounds.  Musculoskeletal:        General: Normal range of motion.     Cervical back: Normal range of motion and neck supple.  Neurological:     General: No focal deficit present.     Mental Status: She is alert and oriented to person, place, and time.  Psychiatric:        Mood and Affect: Mood normal.        Behavior: Behavior normal.       Assessment & Plan:  1. Encounter to establish care (Primary) - Patient presents today to establish  care. During the interim follow-up with primary provider as scheduled.  - Return for annual physical examination, labs, and health maintenance. Arrive fasting meaning having no food for at least 8 hours prior to appointment. You may have only water or black coffee. Please take scheduled medications as normal.  2. Primary hypertension - Continue Doxazosin  as prescribed. Counseled on medication adherence/adverse effects.  - Counseled on blood pressure goal of less than 130/80, low-sodium, DASH diet, medication compliance, and 150 minutes of moderate intensity exercise per week as tolerated. Counseled on medication adherence and adverse effects. - Follow-up with primary provider in 3 months or sooner if needed.  - doxazosin  (CARDURA ) 8 MG tablet; TAKE 1 TABLET AT BEDTIME FOR BLOOD PRESSURE  Dispense: 90 tablet; Refill:  0    Patient was given clear instructions to go to Emergency Department or return to medical center if symptoms don't improve, worsen, or new problems develop.The patient verbalized understanding.  I discussed the assessment and treatment plan with the patient. The patient was provided an opportunity to ask questions and all were answered. The patient agreed with the plan and demonstrated an understanding of the instructions.   The patient was advised to call back or seek an in-person evaluation if the symptoms worsen or if the condition fails to improve as anticipated.    Lavona Pounds, NP 10/11/2023, 1:37 PM Primary Care at [facility]

## 2023-11-08 ENCOUNTER — Encounter: Admitting: Family

## 2023-11-09 ENCOUNTER — Encounter: Admitting: Family Medicine

## 2023-11-10 ENCOUNTER — Ambulatory Visit: Admitting: Podiatry

## 2023-11-10 ENCOUNTER — Encounter: Payer: Self-pay | Admitting: Podiatry

## 2023-11-10 VITALS — BP 142/73 | HR 77 | Temp 97.5°F

## 2023-11-10 DIAGNOSIS — M79676 Pain in unspecified toe(s): Secondary | ICD-10-CM

## 2023-11-10 DIAGNOSIS — B351 Tinea unguium: Secondary | ICD-10-CM | POA: Diagnosis not present

## 2023-11-14 NOTE — Progress Notes (Signed)
 She presents today chief complaint of painful elongated toenails and calluses.  She states that she has a bit of a headache today  Objective: Pulses are palpable.  Vitals are normal.  Toenails are long thick yellow dystrophic with mycotic benign skin lesions bilateral forefoot.  Assessment: Pain limb secondary onychomycosis benign skin lesions.  Plan: Debridement of benign skin lesion debridement of mycotic nails.

## 2023-12-21 ENCOUNTER — Encounter: Admitting: Family Medicine

## 2024-02-04 ENCOUNTER — Other Ambulatory Visit: Payer: Self-pay | Admitting: Family

## 2024-02-04 DIAGNOSIS — I10 Essential (primary) hypertension: Secondary | ICD-10-CM

## 2024-02-06 NOTE — Telephone Encounter (Signed)
 REQUEST FOR 90 DAYS PRESCRIPTION. DX Code Needed.

## 2024-02-06 NOTE — Telephone Encounter (Signed)
 Complete

## 2024-03-08 ENCOUNTER — Ambulatory Visit: Admitting: Podiatry

## 2024-03-08 ENCOUNTER — Encounter: Payer: Self-pay | Admitting: Podiatry

## 2024-03-08 DIAGNOSIS — M79676 Pain in unspecified toe(s): Secondary | ICD-10-CM

## 2024-03-08 DIAGNOSIS — D2372 Other benign neoplasm of skin of left lower limb, including hip: Secondary | ICD-10-CM | POA: Diagnosis not present

## 2024-03-08 DIAGNOSIS — B351 Tinea unguium: Secondary | ICD-10-CM | POA: Diagnosis not present

## 2024-03-08 DIAGNOSIS — D2371 Other benign neoplasm of skin of right lower limb, including hip: Secondary | ICD-10-CM

## 2024-03-08 NOTE — Progress Notes (Signed)
 She presents today chief complaint of painful elongated toenails and calluses.  She states that she has a bit of a headache today  Objective: Pulses are palpable.  Vitals are normal.  Toenails are long thick yellow dystrophic with mycotic benign skin lesions bilateral forefoot.  Assessment: Pain limb secondary onychomycosis benign skin lesions.  Plan: Debridement of benign skin lesion debridement of mycotic nails.

## 2024-03-13 ENCOUNTER — Encounter: Payer: 59 | Admitting: Nurse Practitioner

## 2024-04-10 ENCOUNTER — Encounter: Payer: Self-pay | Admitting: Family Medicine

## 2024-04-10 ENCOUNTER — Ambulatory Visit: Admitting: Family Medicine

## 2024-04-10 VITALS — BP 155/88 | HR 72 | Temp 97.9°F | Resp 16 | Ht 66.5 in | Wt 344.4 lb

## 2024-04-10 DIAGNOSIS — E559 Vitamin D deficiency, unspecified: Secondary | ICD-10-CM | POA: Diagnosis not present

## 2024-04-10 DIAGNOSIS — E782 Mixed hyperlipidemia: Secondary | ICD-10-CM

## 2024-04-10 DIAGNOSIS — R7309 Other abnormal glucose: Secondary | ICD-10-CM | POA: Diagnosis not present

## 2024-04-10 DIAGNOSIS — I10 Essential (primary) hypertension: Secondary | ICD-10-CM

## 2024-04-10 DIAGNOSIS — R252 Cramp and spasm: Secondary | ICD-10-CM | POA: Diagnosis not present

## 2024-04-10 LAB — LIPID PANEL
Cholesterol: 167 mg/dL (ref 0–200)
HDL: 63.4 mg/dL (ref >39.00)
LDL Cholesterol: 93 mg/dL (ref 0–99)
NonHDL: 103.78
Total CHOL/HDL Ratio: 3
Triglycerides: 56 mg/dL (ref 0.0–149.0)
VLDL: 11.2 mg/dL (ref 0.0–40.0)

## 2024-04-10 LAB — BASIC METABOLIC PANEL WITH GFR
BUN: 15 mg/dL (ref 6–23)
CO2: 27 meq/L (ref 19–32)
Calcium: 9.1 mg/dL (ref 8.4–10.5)
Chloride: 102 meq/L (ref 96–112)
Creatinine, Ser: 0.78 mg/dL (ref 0.40–1.20)
GFR: 77.27 mL/min (ref >60.00)
Glucose, Bld: 115 mg/dL — ABNORMAL HIGH (ref 70–99)
Potassium: 4.3 meq/L (ref 3.5–5.1)
Sodium: 137 meq/L (ref 135–145)

## 2024-04-10 LAB — HEMOGLOBIN A1C: Hgb A1c MFr Bld: 5.4 % (ref 4.6–6.5)

## 2024-04-10 LAB — CBC
HCT: 38.2 % (ref 36.0–46.0)
Hemoglobin: 12.6 g/dL (ref 12.0–15.0)
MCHC: 32.9 g/dL (ref 30.0–36.0)
MCV: 89.1 fl (ref 78.0–100.0)
Platelets: 212 K/uL (ref 150.0–400.0)
RBC: 4.29 Mil/uL (ref 3.87–5.11)
RDW: 13.6 % (ref 11.5–15.5)
WBC: 6.3 K/uL (ref 4.0–10.5)

## 2024-04-10 LAB — VITAMIN D 25 HYDROXY (VIT D DEFICIENCY, FRACTURES): VITD: 7.77 ng/mL — ABNORMAL LOW (ref 30.00–100.00)

## 2024-04-10 LAB — CK: Total CK: 118 U/L (ref 17–177)

## 2024-04-10 NOTE — Assessment & Plan Note (Signed)
 Last hemoglobin A1c 5.6. Encouraged consistency with a healthful lifestyle for diabetes prevention.

## 2024-04-10 NOTE — Assessment & Plan Note (Signed)
 BP today is elevated, improved after a few minutes. She has not tolerated ACE inhibitors and losartan . Currently she is on doxazosin  8 mg daily. Before making adjustment, recommend monitoring BP at home regularly. Eye exam is due. Continue low-salt diet. Follow-up in 6 months, before if needed.

## 2024-04-10 NOTE — Assessment & Plan Note (Signed)
Currently she is not on vitamin D supplementation. Further recommendation will be given according to 25 OH vitamin D result. 

## 2024-04-10 NOTE — Patient Instructions (Addendum)
 A few things to remember from today's visit:  Cramp in muscle - Plan: CK, CBC  PreDiabetes - Plan: Hemoglobin A1c  Hyperlipidemia, mixed - Plan: Lipid panel  Essential hypertension - Plan: Basic metabolic panel with GFR  Vitamin D  deficiency - Plan: VITAMIN D  25 Hydroxy (Vit-D Deficiency, Fractures)  No changes today. Arrange appt with podiatrist to address right heel pain. Monitor blood pressure at home.  If you need refills for medications you take chronically, please call your pharmacy. Do not use My Chart to request refills or for acute issues that need immediate attention. If you send a my chart message, it may take a few days to be addressed, specially if I am not in the office.  Please be sure medication list is accurate. If a new problem present, please set up appointment sooner than planned today.

## 2024-04-10 NOTE — Progress Notes (Signed)
 "  Chief Complaint  Patient presents with   New Patient (Initial Visit)    Patient states right foot issue. States she is out of fluid pills and this she in retaining fluid.    Discussed the use of AI scribe software for clinical note transcription with the patient, who gave verbal consent to proceed. History of Present Illness Alicia Diaz is a 68 year old female with past medical history significant for chronic pain, BMI 54, hypertension, hyperlipidemia, and prediabetes here today to establish care. Former PCP Greig Chute, NP.  She mentions that has had right heel pain for some time. She has discussed this problem with her podiatrist, diagnosed with plantar fasciitis in the past. The heel pain has been present since at least October 2025. Her podiatrist recommended a steroid injection, but she has not yet pursued this treatment.  Pain is worse when starts walking after prolonged rest.  She has a history of osteoarthritis and takes Tylenol  for joint pain.   She also takes allopurinol  300 mg daily for gout. She has not experienced a gout attack in six to seven years.  She takes gabapentin  600 mg daily as needed for chronic pain, primarily for back pain and bursitis in the right hip, but she is not currently experiencing back pain. Negative for LE numbness or tingling.  -Lower extremity edema: She has been on  Bumex  1 mg daily as needed, has not taken med in a while.  Mention leg and right hand cramps, which has been going on for a while. She has not identified exacerbating factors. Problem has not improved since she stopped taking diuretic. She experiences these cramps two to three times a week, mostly at night.  Vitamin D  deficiency: She does not take supplements, she feels like it may aggravate cramps.  Lab Results  Component Value Date   VD25OH 7 (L) 03/14/2023   Her social history includes daily alcohol consumption of one to two beers, no smoking history, and a diet that includes  both home-cooked meals and eating out. She reports difficulty eating vegetables due to gastrointestinal issues, reports a hx of rectocele diagnosed in the past.  Her HgA1C and glucose have been elevated in the past. No hx of diabetes.  Lab Results  Component Value Date   HGBA1C 5.6 03/14/2023   Hypertension: Currently she is on doxazosin  8 mg daily. Reporting allergies to ACE inhibitors and losartan . She does not monitor BP regularly. Negative for unusual headache, visual changes, CP, orthopnea, PND, focal neurologic deficit, or worsening edema.  Lab Results  Component Value Date   NA 140 03/14/2023   CL 104 03/14/2023   K 4.2 03/14/2023   CO2 27 03/14/2023   BUN 20 03/14/2023   CREATININE 0.83 03/14/2023   EGFR 77 03/14/2023   CALCIUM 9.1 03/14/2023   ALBUMIN 4.1 11/16/2016   GLUCOSE 103 (H) 03/14/2023   Mixed hyperlipidemia: Currently she is on nonpharmacologic treatment. Lab Results  Component Value Date   CHOL 179 03/14/2023   HDL 68 03/14/2023   LDLCALC 96 03/14/2023   TRIG 68 03/14/2023   CHOLHDL 2.6 03/14/2023   Review of Systems  Constitutional:  Negative for activity change, appetite change, chills and fever.  Respiratory:  Negative for cough and wheezing.   Gastrointestinal:  Negative for abdominal pain, nausea and vomiting.  Endocrine: Negative for cold intolerance and heat intolerance.  Genitourinary:  Negative for decreased urine volume, dysuria and hematuria.  Musculoskeletal:  Positive for arthralgias.  Skin:  Negative  for rash.  Neurological:  Negative for syncope and facial asymmetry.  Psychiatric/Behavioral:  Negative for confusion and hallucinations.   See other pertinent positives and negatives in HPI.  Current Outpatient Medications on File Prior to Visit  Medication Sig Dispense Refill   Acetaminophen  (TYLENOL  ARTHRITIS PAIN PO) Take by mouth.     allopurinol  (ZYLOPRIM ) 300 MG tablet TAKE 1 TABLET DAILY TO PREVENT GOUT 90 tablet 3   aspirin 81  MG tablet Take 81 mg by mouth daily.     bumetanide  (BUMEX ) 1 MG tablet Take 1 tablet (1 mg total) by mouth as needed. 30 tablet 4   doxazosin  (CARDURA ) 8 MG tablet TAKE 1 TABLET AT BEDTIME FOR BLOOD PRESSURE 90 tablet 0   gabapentin  (NEURONTIN ) 600 MG tablet TAKE 1/2 TO 1 TABLET 3 X /DAY AS NEEDED FOR CHRONIC PAIN 270 tablet 3   Current Facility-Administered Medications on File Prior to Visit  Medication Dose Route Frequency Provider Last Rate Last Admin   dexamethasone  (DECADRON ) injection 10 mg  10 mg Intramuscular Once Laurice President, NP        Past Medical History:  Diagnosis Date   Anemia    DDD (degenerative disc disease)    DJD (degenerative joint disease)    Elevated hemoglobin A1c    GERD (gastroesophageal reflux disease)    Hyperlipidemia    Hypertension    Obesity    Sickle cell trait    Allergies  Allergen Reactions   Ace Inhibitors     Cough   Losartan     Paxil [Paroxetine Hcl]     Mood swings   Prednisone      Nausea/ irratated   Vitamin D  Analogs     Cramping    Family History  Problem Relation Age of Onset   Heart disease Mother    Cancer Father        prostate   Multiple myeloma Father    Diabetes Sister    Mental illness Sister    Diabetes Brother    Obstructive Sleep Apnea Brother    Breast cancer Neg Hx     Social History   Socioeconomic History   Marital status: Single    Spouse name: Not on file   Number of children: Not on file   Years of education: Not on file   Highest education level: Not on file  Occupational History   Not on file  Tobacco Use   Smoking status: Never   Smokeless tobacco: Never  Substance and Sexual Activity   Alcohol use: Yes    Alcohol/week: 6.0 standard drinks of alcohol    Types: 6 Standard drinks or equivalent per week   Drug use: Never   Sexual activity: Not Currently  Other Topics Concern   Not on file  Social History Narrative   Not on file   Social Drivers of Health   Financial Resource  Strain: Not on file  Food Insecurity: Not on file  Transportation Needs: Not on file  Physical Activity: Not on file  Stress: Not on file  Social Connections: Not on file    Vitals:   04/10/24 1141 04/10/24 1217  BP: (!) 160/94 (!) 155/88  Pulse: 72   Resp: 16   Temp: 97.9 F (36.6 C)   SpO2: 97%    Body mass index is 54.75 kg/m.  Physical Exam Vitals and nursing note reviewed.  Constitutional:      General: She is not in acute distress.    Appearance: She is  well-developed.  HENT:     Head: Normocephalic and atraumatic.     Mouth/Throat:     Mouth: Mucous membranes are moist.     Pharynx: Oropharynx is clear.  Eyes:     Conjunctiva/sclera: Conjunctivae normal.  Cardiovascular:     Rate and Rhythm: Normal rate and regular rhythm.     Pulses:          Dorsalis pedis pulses are 2+ on the right side and 2+ on the left side.     Heart sounds: No murmur heard.    Comments: Non pitting LE edema, R>L, lumphedema. Pulmonary:     Effort: Pulmonary effort is normal. No respiratory distress.     Breath sounds: Normal breath sounds.  Abdominal:     Palpations: Abdomen is soft. There is no hepatomegaly or mass.     Tenderness: There is no abdominal tenderness.  Lymphadenopathy:     Cervical: No cervical adenopathy.  Skin:    General: Skin is warm.     Findings: No erythema or rash.  Neurological:     General: No focal deficit present.     Mental Status: She is alert and oriented to person, place, and time.     Cranial Nerves: No cranial nerve deficit.     Comments: Antalgic gait, not assisted.  Psychiatric:        Mood and Affect: Mood and affect normal.    ASSESSMENT AND PLAN:  Alicia Diaz was seen today for new patient (initial visit).  Diagnoses and all orders for this visit: Lab Results  Component Value Date   CHOL 167 04/10/2024   HDL 63.40 04/10/2024   LDLCALC 93 04/10/2024   TRIG 56.0 04/10/2024   CHOLHDL 3 04/10/2024   Lab Results  Component  Value Date   WBC 6.3 04/10/2024   HGB 12.6 04/10/2024   HCT 38.2 04/10/2024   MCV 89.1 04/10/2024   PLT 212.0 04/10/2024   Lab Results  Component Value Date   CKTOTAL 118 04/10/2024   Lab Results  Component Value Date   HGBA1C 5.4 04/10/2024   Lab Results  Component Value Date   NA 137 04/10/2024   CL 102 04/10/2024   K 4.3 04/10/2024   CO2 27 04/10/2024   BUN 15 04/10/2024   CREATININE 0.78 04/10/2024   GFR 77.27 04/10/2024   CALCIUM 9.1 04/10/2024   ALBUMIN 4.1 11/16/2016   GLUCOSE 115 (H) 04/10/2024   Lab Results  Component Value Date   VD25OH 7.77 (L) 04/10/2024   Hyperlipidemia, mixed Assessment & Plan: Currently she is not on pharmacologic treatment. Last lipid panel in 02/2023 was in normal range. Further recommendation will be given according to lipid panel result.  Orders: -     Lipid panel; Future  Cramp in muscle Chronic. We discussed possible etiologies. Some of her chronic co morbilities can be contributing factors. Wt loss may help. Continue adequate hydration. Further recommendations according to lab results.  -     CK; Future -     CBC; Future  PreDiabetes Assessment & Plan: Last hemoglobin A1c 5.6. Encouraged consistency with a healthful lifestyle for diabetes prevention.  Orders: -     Hemoglobin A1c; Future  Essential hypertension Assessment & Plan: BP today is elevated, improved after a few minutes. She has not tolerated ACE inhibitors and losartan . Currently she is on doxazosin  8 mg daily. Before making adjustment, recommend monitoring BP at home regularly. Eye exam is due. Continue low-salt diet. Follow-up in 6  months, before if needed.  Orders: -     Basic metabolic panel with GFR; Future  Vitamin D  deficiency Assessment & Plan: Currently she is not on vitamin D  supplementation. Further recommendation will be given according to 25 OH vitamin D  result.  Orders: -     VITAMIN D  25 Hydroxy (Vit-D Deficiency, Fractures);  Future   In regard to right plantar fasciitis, she has discussed this problem with her podiatrist and treatment recommendations will given.  Recommend arranging a follow-up appointment.  Return in about 6 months (around 10/08/2024) for chronic problems.  Robben Jagiello G. Azoria Abbett, MD  [facility]. [place] office. "

## 2024-04-10 NOTE — Assessment & Plan Note (Signed)
 Currently she is not on pharmacologic treatment. Last lipid panel in 02/2023 was in normal range. Further recommendation will be given according to lipid panel result.

## 2024-04-12 ENCOUNTER — Ambulatory Visit: Payer: Self-pay | Admitting: Family Medicine

## 2024-04-12 ENCOUNTER — Telehealth: Payer: Self-pay | Admitting: *Deleted

## 2024-04-12 MED ORDER — VITAMIN D (ERGOCALCIFEROL) 1.25 MG (50000 UNIT) PO CAPS: 50000.0000 [IU] | ORAL_CAPSULE | ORAL | 0 refills | Status: DC

## 2024-04-12 NOTE — Telephone Encounter (Signed)
 Copied from CRM [id number]. Topic: Clinical - Lab/Test Results >> Apr 12, 2024  3:48 PM Brittany M wrote: Reason for CRM: patient asking for a nurse to call her to go over lab results she had the other day. She does not have mychart, she asked for a phone call.

## 2024-04-13 NOTE — Telephone Encounter (Signed)
 Please see results follow up note 04/12/2024.

## 2024-05-03 ENCOUNTER — Ambulatory Visit: Admitting: Podiatry

## 2024-05-08 ENCOUNTER — Ambulatory Visit (INDEPENDENT_AMBULATORY_CARE_PROVIDER_SITE_OTHER)

## 2024-05-08 ENCOUNTER — Ambulatory Visit: Admitting: Podiatry

## 2024-05-08 ENCOUNTER — Encounter: Payer: Self-pay | Admitting: Podiatry

## 2024-05-08 DIAGNOSIS — M722 Plantar fascial fibromatosis: Secondary | ICD-10-CM

## 2024-05-08 MED ORDER — TRIAMCINOLONE ACETONIDE 40 MG/ML IJ SUSP
20.0000 mg | Freq: Once | INTRAMUSCULAR | Status: AC
  Administered 2024-05-08: 11:00:00 20 mg

## 2024-05-08 NOTE — Patient Instructions (Signed)

## 2024-05-08 NOTE — Progress Notes (Signed)
 She presents today chief complaint of heel pain x 2 months.  States that she would like an injection if she can get 1 she has been taking Tylenol  and ibuprofen which helps some but is still very sore.  Objective: Vital signs are stable alert and oriented x 3 pulses are palpable.  Severe pain on palpation medial calcaneal tubercle of the right heel.  Radiographs taken today demonstrate soft tissue increase in density at the plantar fascial calcaneal insertion site consistent with plantar fasciitis.  Assessment: Plan fasciitis right foot.  Mild pes planovalgus.  Plan: I injected the area today 20 mg Kenalog  5 mg Marcaine for maximal tenderness discussed appropriate shoe gear.  Follow-up with her as needed

## 2024-06-01 ENCOUNTER — Ambulatory Visit: Payer: Self-pay

## 2024-06-01 NOTE — Telephone Encounter (Signed)
 FYI Only or Action Required?: FYI only for provider: going .  Patient was last seen in primary care on 04/10/2024 by Jordan, Betty G, MD.  Called Nurse Triage reporting No chief complaint on file..  Symptoms began a week ago.  Interventions attempted: OTC medications: alive, .  Symptoms are: gradually improving.  Triage Disposition: No disposition on file.  Patient/caregiver understands and will follow disposition?:   Copied from CRM [id number]. Topic: Clinical - Red Word Triage >> Jun 01, 2024  1:36 PM Harlene ORN wrote: Red Word that prompted transfer to Nurse Triage: pulled a muscle in the back of her left thigh; been hurting bad for a week. Reason for Disposition  [1] Thigh or calf pain AND [2] only 1 side AND [3] present > 1 hour  (Exception: Chronic unchanged pain.)  Answer Assessment - Initial Assessment Questions Is going to UC  1. ONSET: When did the pain start?      1 week 2. LOCATION: Where is the pain located?      Back of left thigh 3. PAIN: How bad is the pain?    (Scale 1-10; or mild, moderate, severe)     9, slight decrease over past few days 4. WORK OR EXERCISE: Has there been any recent work or exercise that involved this part of the body?      Cramp like onset when she was stepping up on a step 5. CAUSE: What do you think is causing the leg pain?     Muscle cramp 6. OTHER SYMPTOMS: Do you have any other symptoms? (e.g., chest pain, back pain, breathing difficulty, swelling, rash, fever, numbness, weakness)     Denies numbness, tingling  Protocols used: Leg Pain-A-AH

## 2024-06-02 ENCOUNTER — Encounter (HOSPITAL_COMMUNITY): Payer: Self-pay

## 2024-06-02 ENCOUNTER — Ambulatory Visit (HOSPITAL_COMMUNITY)
Admission: EM | Admit: 2024-06-02 | Discharge: 2024-06-02 | Disposition: A | Discharge status: Home / Self Care | Attending: Emergency Medicine | Admitting: Emergency Medicine

## 2024-06-02 DIAGNOSIS — M25552 Pain in left hip: Secondary | ICD-10-CM

## 2024-06-02 DIAGNOSIS — M5442 Lumbago with sciatica, left side: Secondary | ICD-10-CM

## 2024-06-02 MED ORDER — DEXAMETHASONE 4 MG PO TABS: ORAL_TABLET | ORAL | 0 refills | Status: AC

## 2024-06-02 MED ORDER — LIDOCAINE 5 % EX PTCH: 1.0000 | MEDICATED_PATCH | CUTANEOUS | 0 refills | Status: AC

## 2024-06-02 NOTE — ED Triage Notes (Signed)
 Pt c/o left hip pain and pressure x 1 week. Denies any recent trauma or falls to her hip.

## 2024-06-02 NOTE — Discharge Instructions (Addendum)
 As discussed I believe your pain is likely muscular in nature with sciatica present. I have prescribed a tapered dose of dexamethasone  as you have a previous history of intolerance to prednisone . Take 1 tablet 3 times daily for 2 days, then 1 tablet twice daily for 2 days, then 1 tablet daily until finished to help with your pain. I have attached information for [facility] that you can follow-up with if your pain continues for further evaluation. Otherwise follow-up with your primary care provider or return here as needed. You can apply lidocaine  patch for 12 hours at a time as needed for additional pain relief. Otherwise take 500 to 1000 mg of Tylenol  every 6-8 hours as needed for pain.

## 2024-06-02 NOTE — ED Provider Notes (Addendum)
 " [facility]    CSN: [id number] Arrival date & time: 06/02/24  1207      History   Chief Complaint Chief Complaint  Patient presents with   Hip Pain    HPI Alicia Diaz is a 69 y.o. female.   Patient presents with left-sided hip and low back pain that began approximately 1 week ago.  Patient states the pain initially began down left back of her left thigh and now she is experiencing pain in her left low back and left hip.  Patient reports worsening pain with movement.  Patient states that at times she will have shooting pain down her left leg as well.  Patient states that yesterday she was experiencing some mild tingling and numbness down her leg as well.    Patient denies taking any medication for her pain.  Patient denies any recent falls or known traumas.  Patient states that she does have a history of sciatica on her right side and states that this feels very similar to this.  Of note patient does have a documented intolerance to prednisone  with nausea and irritation.  Patient has been given a dexamethasone  taper in the past to treat her right sided sciatica with much relief and without any side effects.  Patient denies any history of diabetes and latest hemoglobin A1c was 5.4 in November 2025.  The history is provided by the patient and medical records.  Hip Pain    Past Medical History:  Diagnosis Date   Anemia    DDD (degenerative disc disease)    DJD (degenerative joint disease)    Elevated hemoglobin A1c    GERD (gastroesophageal reflux disease)    Hyperlipidemia    Hypertension    Obesity    Sickle cell trait     Patient Active Problem List   Diagnosis Date Noted   Abnormal cervical Papanicolaou smear 09/11/2019   Anemia due to blood loss 09/11/2019   Incontinence of feces 09/11/2019   Morbid obesity (HCC) 09/11/2019   Premature birth of twins 09/11/2019   Strain of abdominal muscle 09/11/2019   PreDiabetes 04/23/2018   Idiopathic gout  06/18/2014   Vitamin D  deficiency 11/21/2013   Rectocele 05/30/2013   Morbid obesity with BMI of 50.0-59.9, adult (HCC) 05/02/2013   Hyperlipidemia, mixed    Essential hypertension    Sickle cell trait    Abnormal glucose    DJD (degenerative joint disease)     Past Surgical History:  Procedure Laterality Date   SHOULDER SURGERY Left 2007    OB History   No obstetric history on file.      Home Medications    Prior to Admission medications  Medication Sig Start Date End Date Taking? Authorizing Provider  dexamethasone  (DECADRON ) 4 MG tablet Take 1 tablet 3 times a day for 2 days, then 1 tablet twice a day for 2 days, then 1 tablet daily until finished. 06/02/24  Yes Coburn Knaus A, NP  lidocaine  (LIDODERM ) 5 % Place 1 patch onto the skin daily. Remove & Discard patch within 12 hours or as directed by MD 06/02/24  Yes Johnie Flaming A, NP  Acetaminophen  (TYLENOL  ARTHRITIS PAIN PO) Take by mouth.    [provider]  allopurinol  (ZYLOPRIM ) 300 MG tablet TAKE 1 TABLET DAILY TO PREVENT GOUT 02/20/23   Tonita Fallow, MD  aspirin 81 MG tablet Take 81 mg by mouth daily.    [provider]  bumetanide  (BUMEX ) 1 MG tablet Take 1 tablet (  1 mg total) by mouth as needed. 03/10/22   Wilkinson, Dana E, NP  doxazosin  (CARDURA ) 8 MG tablet TAKE 1 TABLET AT BEDTIME FOR BLOOD PRESSURE 02/06/24   Jaycee Greig PARAS, NP  gabapentin  (NEURONTIN ) 600 MG tablet TAKE 1/2 TO 1 TABLET 3 X /DAY AS NEEDED FOR CHRONIC PAIN 05/13/23   Tonita Fallow, MD  Vitamin D , Ergocalciferol , (DRISDOL ) 1.25 MG (50000 UNIT) CAPS capsule Take 1 capsule (50,000 Units total) by mouth every 7 (seven) days for 12 doses. 04/12/24 06/29/24  Jordan, Betty G, MD    Family History Family History  Problem Relation Age of Onset   Heart disease Mother    Cancer Father        prostate   Multiple myeloma Father    Diabetes Sister    Mental illness Sister    Diabetes Brother    Obstructive Sleep Apnea Brother     Breast cancer Neg Hx     Social History Social History[1]   Allergies   Ace inhibitors, Losartan , Paxil [paroxetine hcl], Prednisone , and Vitamin d  analogs   Review of Systems Review of Systems  Per HPI  Physical Exam Triage Vital Signs ED Triage Vitals  Encounter Vitals Group     BP 06/02/24 1315 (!) 142/88     Girls Systolic BP Percentile --      Girls Diastolic BP Percentile --      Boys Systolic BP Percentile --      Boys Diastolic BP Percentile --      Pulse Rate 06/02/24 1315 90     Resp 06/02/24 1315 (!) 90     Temp 06/02/24 1315 98.3 F (36.8 C)     Temp Source 06/02/24 1315 Oral     SpO2 06/02/24 1315 95 %     Weight --      Height --      Head Circumference --      Peak Flow --      Pain Score 06/02/24 1316 8     Pain Loc --      Pain Education --      Exclude from Growth Chart --    No data found.  Updated Vital Signs BP (!) 142/88 (BP Location: Left Arm)   Pulse 90   Temp 98.3 F (36.8 C) (Oral)   Resp 18   SpO2 95%   Visual Acuity Right Eye Distance:   Left Eye Distance:   Bilateral Distance:    Right Eye Near:   Left Eye Near:    Bilateral Near:     Physical Exam Vitals and nursing note reviewed.  Constitutional:      General: She is awake. She is not in acute distress.    Appearance: Normal appearance. She is well-developed and well-groomed. She is not ill-appearing.  Musculoskeletal:     Cervical back: Normal.     Thoracic back: Normal.     Lumbar back: Tenderness present. No swelling, edema, deformity, signs of trauma or bony tenderness. Normal range of motion. Positive left straight leg raise test. Negative right straight leg raise test.       Back:     Left hip: Tenderness present. No deformity or bony tenderness. Normal range of motion.     Comments: Tenderness noted to left low back that extends to left hip.  Skin:    General: Skin is warm and dry.  Neurological:     Mental Status: She is alert.  Psychiatric:  Behavior: Behavior is cooperative.      UC Treatments / Results  Labs (all labs ordered are listed, but only abnormal results are displayed) Labs Reviewed - No data to display  EKG   Radiology No results found.  Procedures Procedures (including critical care time)  Medications Ordered in UC Medications - No data to display  Initial Impression / Assessment and Plan / UC Course  I have reviewed the triage vital signs and the nursing notes.  Pertinent labs & imaging results that were available during my care of the patient were reviewed by me and considered in my medical decision making (see chart for details).     Patient is overall well-appearing.  Vitals are stable.  Pain likely muscular in nature with sciatica present.  Prescribed tapered dose of dexamethasone  due to previous history of intolerance to prednisone  and patient taking the progressive dexamethasone  in the past with relief of sciatica on the right side without any intolerance.  Also prescribed lidocaine  patches for additional relief.  Given orthopedic follow-up if needed.  Discussed follow-up and return precautions. Final Clinical Impressions(s) / UC Diagnoses   Final diagnoses:  Acute left-sided low back pain with left-sided sciatica  Left hip pain     Discharge Instructions      As discussed I believe your pain is likely muscular in nature with sciatica present. I have prescribed a tapered dose of dexamethasone  as you have a previous history of intolerance to prednisone . Take 1 tablet 3 times daily for 2 days, then 1 tablet twice daily for 2 days, then 1 tablet daily until finished to help with your pain. I have attached information for [facility] that you can follow-up with if your pain continues for further evaluation. Otherwise follow-up with your primary care provider or return here as needed. You can apply lidocaine  patch for 12 hours at a time as needed for additional pain relief. Otherwise take 500  to 1000 mg of Tylenol  every 6-8 hours as needed for pain.     ED Prescriptions     Medication Sig Dispense Auth. Provider   dexamethasone  (DECADRON ) 4 MG tablet Take 1 tablet 3 times a day for 2 days, then 1 tablet twice a day for 2 days, then 1 tablet daily until finished. 13 tablet Johnie, Alexxus Sobh A, NP   lidocaine  (LIDODERM ) 5 % Place 1 patch onto the skin daily. Remove & Discard patch within 12 hours or as directed by MD 30 patch Johnie Rumaldo LABOR, NP      PDMP not reviewed this encounter.    Johnie Rumaldo A, NP 06/02/24 1414     [1]  Social History Tobacco Use   Smoking status: Never   Smokeless tobacco: Never  Substance Use Topics   Alcohol use: Yes    Alcohol/week: 6.0 standard drinks of alcohol    Types: 6 Standard drinks or equivalent per week   Drug use: Never     Johnie Rumaldo A, NP 06/02/24 1414  "

## 2024-06-04 ENCOUNTER — Other Ambulatory Visit: Payer: Self-pay | Admitting: Family Medicine

## 2024-06-04 MED ORDER — ALLOPURINOL 300 MG PO TABS: ORAL_TABLET | ORAL | 3 refills | Status: AC

## 2024-06-04 MED ORDER — GABAPENTIN 600 MG PO TABS: ORAL_TABLET | ORAL | 3 refills | Status: AC

## 2024-06-04 NOTE — Telephone Encounter (Signed)
 Copied from CRM #[id number]. Topic: Clinical - Medication Refill >> Jun 04, 2024 11:42 AM Tiffini S wrote: Medication:  allopurinol  (ZYLOPRIM ) 300 MG tablet, gabapentin  (NEURONTIN ) 600 MG tablet  Has the patient contacted their pharmacy? Yes (Agent: If no, request that the patient contact the pharmacy for the refill. If patient does not wish to contact the pharmacy document the reason why and proceed with request.) (Agent: If yes, when and what did the pharmacy advise?)  This is the patient's preferred pharmacy:  [id number] - [place] [place] - [address] AT [facility] [address] [place] [address] Phone: [phone] Fax: [phone]  Is this the correct pharmacy for this prescription? Yes If no, delete pharmacy and type the correct one.   Has the prescription been filled recently? Yes  Is the patient out of the medication? Yes  Has the patient been seen for an appointment in the last year OR does the patient have an upcoming appointment? Yes  Can we respond through MyChart? No, please call [phone]  Agent: Please be advised that Rx refills may take up to 3 business days. We ask that you follow-up with your pharmacy.

## 2024-06-04 NOTE — Telephone Encounter (Signed)
 Evaluated in UC 06/02/24. BJ

## 2024-06-11 ENCOUNTER — Other Ambulatory Visit: Payer: Self-pay | Admitting: Family

## 2024-06-11 DIAGNOSIS — I10 Essential (primary) hypertension: Secondary | ICD-10-CM

## 2024-06-26 ENCOUNTER — Ambulatory Visit: Payer: Self-pay

## 2024-06-26 NOTE — Telephone Encounter (Signed)
 FYI Only or Action Required?: FYI only for provider: appointment scheduled on 06/27/24.  Patient was last seen in primary care on 04/10/2024 by Jordan, Betty G, MD.  Called Nurse Triage reporting Rash.  Symptoms began several days ago.  Interventions attempted: OTC medications: Neospronin.  Symptoms are: gradually worsening.  Triage Disposition: See Physician Within 24 Hours (overriding See HCP Within 4 Hours (Or PCP Triage))  Patient/caregiver understands and will follow disposition?: Yes                  Message from Mia F sent at 06/26/2024  3:10 PM EST  Reason for Triage: Has a rash across the lower stomach. It started to hurt when touching. Saturday she noticed there was brown residue with an order when wiping. She says the rash does not itch but it does hurt. She says he has been using Neosporin and it has been helping a little but she is wanting to try something else over the counter   Reason for Disposition  [1] Looks infected (e.g., spreading redness, pus) AND [2] large red area (> 2 inches or 5 cm)  [1] SEVERE pain (e.g., excruciating, unable to do any normal activities) AND [2] not improved after 2 hours of pain medicine  Answer Assessment - Initial Assessment Questions 1. APPEARANCE of RASH: What does the rash look like? (e.g., blisters, dry flaky skin, red spots, redness, sores)     Foul odor Yellow to brown discharge Red   2. LOCATION: Where is the rash located?      Pannus. 3. NUMBER: How many spots are there?      1. 4. SIZE: How big are the spots? (e.g., inches, cm; or compare to size of pinhead, tip of pen, eraser, pea)      Greater than 2 inches.  5. ONSET: When did the rash start?      X 4-5 days.  6. ITCHING: Does the rash itch? If Yes, ask: How bad is the itch?  (Scale 0-10; or none, mild, moderate, severe)     No. 7. PAIN: Does the rash hurt? If Yes, ask: How bad is the pain?  (Scale 0-10; or none, mild, moderate,  severe)     Tender to touch.  8. OTHER SYMPTOMS: Do you have any other symptoms? (e.g., fever)     No difficulty breathing, difficulty swallowing or fever, blisters or flaking skin.  Answer Assessment - Initial Assessment Questions 1. ONSET: When did the pain start?      This AM.  2. LOCATION: Where is the pain located?   (e.g., around nail, entire toe, at foot joint)      Right great toe.  3. PAIN: How bad is the pain?    (Scale 1-10; or mild, moderate, severe)     Sometimes 10/10  4. APPEARANCE: What does the toe look like? (e.g., redness, swelling, bruising, pallor)     No redness or swelling.  5. CAUSE: What do you think is causing the toe pain?     Gout.   6. OTHER SYMPTOMS: Do you have any other symptoms? (e.g., leg pain, rash, fever, numbness)     No.  Protocols used: Rash or Redness - Localized-A-AH, Toe Pain-A-AH

## 2024-06-27 ENCOUNTER — Ambulatory Visit: Admitting: Family Medicine

## 2024-06-27 ENCOUNTER — Encounter: Payer: Self-pay | Admitting: Family Medicine

## 2024-06-27 VITALS — BP 128/82 | HR 79 | Temp 97.9°F | Resp 16 | Ht 66.5 in | Wt 348.8 lb

## 2024-06-27 DIAGNOSIS — M79674 Pain in right toe(s): Secondary | ICD-10-CM

## 2024-06-27 DIAGNOSIS — L03311 Cellulitis of abdominal wall: Secondary | ICD-10-CM

## 2024-06-27 DIAGNOSIS — M1A0790 Idiopathic chronic gout, unspecified ankle and foot, without tophus (tophi): Secondary | ICD-10-CM

## 2024-06-27 DIAGNOSIS — L304 Erythema intertrigo: Secondary | ICD-10-CM | POA: Insufficient documentation

## 2024-06-27 MED ORDER — DOXYCYCLINE HYCLATE 100 MG PO TABS: 100.0000 mg | ORAL_TABLET | Freq: Two times a day (BID) | ORAL | 0 refills | Status: AC

## 2024-06-27 MED ORDER — NYSTATIN 100000 UNIT/GM EX POWD: 1.0000 | Freq: Three times a day (TID) | CUTANEOUS | 2 refills | Status: AC

## 2024-06-27 MED ORDER — FLUCONAZOLE 150 MG PO TABS: 150.0000 mg | ORAL_TABLET | Freq: Once | ORAL | 0 refills | Status: AC

## 2024-06-27 MED ORDER — TRIAMCINOLONE ACETONIDE 0.1 % EX CREA: 1.0000 | TOPICAL_CREAM | Freq: Every day | CUTANEOUS | 1 refills | Status: AC | PRN

## 2024-06-27 NOTE — Patient Instructions (Addendum)
 A few things to remember from today's visit:  Abdominal wall cellulitis - Plan: doxycycline  (VIBRA -TABS) 100 MG tablet  Intertrigo - Plan: fluconazole  (DIFLUCAN ) 150 MG tablet, nystatin  (MYCOSTATIN /NYSTOP ) powder, triamcinolone  cream (KENALOG ) 0.1 %  Pain of right great toe  Chronic idiopathic gout involving toe without tophus, unspecified laterality  Antibacterial soap, keep area dry. Powder daily and triamcinolone  small amount daily as needed for itching. Avoid scratching.  If you need refills for medications you take chronically, please call your pharmacy. Do not use My Chart to request refills or for acute issues that need immediate attention. If you send a my chart message, it may take a few days to be addressed, specially if I am not in the office.  Please be sure medication list is accurate. If a new problem present, please set up appointment sooner than planned today.

## 2024-06-27 NOTE — Assessment & Plan Note (Signed)
 Currently on allopurinol  300 mg daily, no changes today. Continue low purine diet. Bumex  is on her medication list but she does not take me frequently.

## 2024-06-27 NOTE — Assessment & Plan Note (Signed)
 We discussed diagnosis, prognosis, and treatment options. Stressed the importance of keeping area clean with soap and water as well as dry. Recommend using antibacterial soap. Nystatin  powder tid prn.

## 2024-06-27 NOTE — Progress Notes (Signed)
 "  ACUTE VISIT Chief Complaint  Patient presents with   Toe Pain    Right toe pain x days possible gout    Rash    X1 week itching , discharge    Discussed the use of AI scribe software for clinical note transcription with the patient, who gave verbal consent to proceed. History of Present Illness Alicia Diaz is a 69 year old female with PMHx significant for gout, hyperlipidemia, hypertension, OA, vitamin D  deficiency who presents with right toe pain and a rash as described above.  -She experiences severe, intermittent pain in her right big toe yesterday.Peak intensity of 10 out of 10, and subsided after a few hours. She thought it could be a gout attack. She took Tylenol . Gout on allopurinol  300 mg daily, she has not had a gout attack in some time. No history of trauma or dietary changes. She denies any recent injury, new footwear, joint edema/erythema, numbness, or tingling in the toe.   She is on Bumex  1 mg daily as needed, she does not take medication frequently. No new medications. Lab Results  Component Value Date   LABURIC 4.7 03/10/2022   Lab Results  Component Value Date   NA 137 04/10/2024   CL 102 04/10/2024   K 4.3 04/10/2024   CO2 27 04/10/2024   BUN 15 04/10/2024   CREATININE 0.78 04/10/2024   GFR 77.27 04/10/2024   CALCIUM 9.1 04/10/2024   ALBUMIN 4.1 11/16/2016   GLUCOSE 115 (H) 04/10/2024   In terms of lifestyle, she is trying to reduce starch intake and limit red meat consumption, although she admits to increased consumption during recent snow days.   -She has a pruritic rash on her lower abdomen, yellowish-brown discharge and a noticeable odor. The rash began over the weekend and has persisted since Saturday. She has been managing it by cleaning the area and applying Neosporin since Sunday.  No fever or chills are present.  She recalls having a similar rash many years ago but not to this extent. She wears cotton panties to help manage moisture in  the affected area.  Review of Systems  Constitutional:  Negative for activity change, appetite change and chills.  Respiratory:  Negative for cough and shortness of breath.   Cardiovascular:  Negative for chest pain and leg swelling.  Gastrointestinal:  Negative for abdominal pain, nausea and vomiting.  Endocrine: Negative for cold intolerance and heat intolerance.  Genitourinary:  Negative for decreased urine volume, dysuria and hematuria.  Skin:  Negative for wound.  Neurological:  Negative for syncope and weakness.  See other pertinent positives and negatives in HPI.  Medications Ordered Prior to Encounter[1]  Past Medical History:  Diagnosis Date   Anemia    DDD (degenerative disc disease)    DJD (degenerative joint disease)    Elevated hemoglobin A1c    GERD (gastroesophageal reflux disease)    Hyperlipidemia    Hypertension    Obesity    Sickle cell trait    Allergies[2]  Social History   Socioeconomic History   Marital status: Single    Spouse name: Not on file   Number of children: Not on file   Years of education: Not on file   Highest education level: Not on file  Occupational History   Not on file  Tobacco Use   Smoking status: Never   Smokeless tobacco: Never  Substance and Sexual Activity   Alcohol use: Yes    Alcohol/week: 6.0 standard drinks of  alcohol    Types: 6 Standard drinks or equivalent per week   Drug use: Never   Sexual activity: Not Currently  Other Topics Concern   Not on file  Social History Narrative   Not on file   Social Drivers of Health   Tobacco Use: Low Risk (06/27/2024)   Patient History    Smoking Tobacco Use: Never    Smokeless Tobacco Use: Never    Passive Exposure: Not on file  Financial Resource Strain: Not on file  Food Insecurity: Not on file  Transportation Needs: Not on file  Physical Activity: Not on file  Stress: Not on file  Social Connections: Not on file  Depression (PHQ2-9): Low Risk (04/10/2024)    Depression (PHQ2-9)    PHQ-2 Score: 0  Alcohol Screen: Not on file  Housing: Not on file  Utilities: Not on file  Health Literacy: Not on file   Vitals:   06/27/24 1339  BP: 128/82  Pulse: 79  Resp: 16  Temp: 97.9 F (36.6 C)  SpO2: 100%  . Wt Readings from Last 3 Encounters:  06/27/24 (!) 348 lb 12.8 oz (158.2 kg)  04/10/24 (!) 344 lb 6.4 oz (156.2 kg)  10/11/23 (!) 336 lb (152.4 kg)   Body mass index is 55.45 kg/m.  Physical Exam Vitals and nursing note reviewed.  Constitutional:      General: She is not in acute distress.    Appearance: She is well-developed.  HENT:     Head: Normocephalic and atraumatic.  Eyes:     Conjunctiva/sclera: Conjunctivae normal.  Cardiovascular:     Rate and Rhythm: Normal rate and regular rhythm.     Pulses:          Dorsalis pedis pulses are 2+ on the right side.     Heart sounds: No murmur heard.    Comments: 1+ pitting pedal edema right foot. Pulmonary:     Effort: Pulmonary effort is normal. No respiratory distress.     Breath sounds: Normal breath sounds.  Musculoskeletal:     Right foot: Decreased range of motion. Normal capillary refill. No tenderness or bony tenderness. Normal pulse.     Left foot: No swelling.  Lymphadenopathy:     Cervical: No cervical adenopathy.  Skin:    General: Skin is warm.     Findings: Erythema and rash present.     Comments: On lower abdomen in crease there is erythema, mild localized induration+ tenderness, odorous yellowish drainage and superficial excoriations.  Neurological:     Mental Status: She is alert and oriented to person, place, and time.  Psychiatric:        Mood and Affect: Mood and affect normal.   ASSESSMENT AND PLAN:  Alicia Diaz was seen today for toe pain and rash.  Diagnoses and all orders for this visit:  Abdominal wall cellulitis On lower abdomen, in skin fold/crease there is mild erythema, yellowish drainage and a small area of induration and pain, no  fluctuant area. Recommend course of oral antibiotics, doxycycline  100 mg twice daily for 10 days. Keep area clean with soap and water. Clearly instructed about warning signs.  -     Doxycycline  Hyclate; Take 1 tablet (100 mg total) by mouth 2 (two) times daily for 10 days.  Dispense: 20 tablet; Refill: 0  Intertrigo Assessment & Plan: We discussed diagnosis, prognosis, and treatment options. Stressed the importance of keeping area clean with soap and water as well as dry. Recommend using antibacterial soap.  Cotton underwear. A waist band may be tried to help lift abdominal skin and allow air circulation around lower abdominal skin folds/creases. Nystatin  powder tid prn.  Orders: -     Fluconazole ; Take 1 tablet (150 mg total) by mouth once for 1 dose.  Dispense: 1 tablet; Refill: 0 -     Nystatin ; Apply 1 Application topically 3 (three) times daily.  Dispense: 180 g; Refill: 2 -     Triamcinolone  Acetonide; Apply 1 Application topically daily as needed (on lower abdomen).  Dispense: 30 g; Refill: 1  Pain of right great toe Pain has resolved. Examination today is not suggestive of gout exacerbation. We did close differential diagnosis,?  OA.  Chronic idiopathic gout involving toe without tophus, unspecified laterality Assessment & Plan: Currently on allopurinol  300 mg daily, no changes today. Continue low purine diet. Bumex  is on her medication list but she does not take me frequently.   Return if symptoms worsen or fail to improve, for keep next appointment.  Althia Egolf G. Adrea Sherpa, MD  [facility]. [place] office.     [1]  Current Outpatient Medications on File Prior to Visit  Medication Sig Dispense Refill   Acetaminophen  (TYLENOL  ARTHRITIS PAIN PO) Take by mouth.     allopurinol  (ZYLOPRIM ) 300 MG tablet Take 1 tablet Daily to Prevent Gout 90 tablet 3   aspirin 81 MG tablet Take 81 mg by mouth daily.     bumetanide  (BUMEX ) 1 MG tablet Take 1 tablet (1 mg total)  by mouth as needed. 30 tablet 4   dexamethasone  (DECADRON ) 4 MG tablet Take 1 tablet 3 times a day for 2 days, then 1 tablet twice a day for 2 days, then 1 tablet daily until finished. 13 tablet 0   doxazosin  (CARDURA ) 8 MG tablet TAKE 1 TABLET AT BEDTIME FOR BLOOD PRESSURE 30 tablet 2   gabapentin  (NEURONTIN ) 600 MG tablet Take 1/2 to 1 tablet 3 x /day as needed for Chronic Pain 270 tablet 3   lidocaine  (LIDODERM ) 5 % Place 1 patch onto the skin daily. Remove & Discard patch within 12 hours or as directed by MD 30 patch 0   Vitamin D , Ergocalciferol , (DRISDOL ) 1.25 MG (50000 UNIT) CAPS capsule Take 1 capsule (50,000 Units total) by mouth every 7 (seven) days for 12 doses. 12 capsule 0   Current Facility-Administered Medications on File Prior to Visit  Medication Dose Route Frequency Provider Last Rate Last Admin   dexamethasone  (DECADRON ) injection 10 mg  10 mg Intramuscular Once Cranford, Tonya, NP      [2]  Allergies Allergen Reactions   Ace Inhibitors     Cough   Losartan     Paxil [Paroxetine Hcl]     Mood swings   Prednisone      Nausea/ irratated   Vitamin D  Analogs     Cramping   "

## 2024-06-29 ENCOUNTER — Other Ambulatory Visit: Payer: Self-pay | Admitting: Family Medicine

## 2024-06-29 DIAGNOSIS — E559 Vitamin D deficiency, unspecified: Secondary | ICD-10-CM

## 2024-07-10 ENCOUNTER — Ambulatory Visit: Admitting: Podiatry

## 2024-10-08 ENCOUNTER — Ambulatory Visit: Admitting: Family Medicine
